# Patient Record
Sex: Female | Born: 1970 | Race: Black or African American | Hispanic: No | Marital: Single | Smoking: Never smoker
Health system: Southern US, Community
[De-identification: ages and names within clinical notes are randomized; demographics above are authoritative.]

## PROBLEM LIST (undated history)

## (undated) DIAGNOSIS — I10 Essential (primary) hypertension: Secondary | ICD-10-CM

## (undated) DIAGNOSIS — Z950 Presence of cardiac pacemaker: Secondary | ICD-10-CM

## (undated) DIAGNOSIS — F411 Generalized anxiety disorder: Secondary | ICD-10-CM

## (undated) DIAGNOSIS — G8929 Other chronic pain: Secondary | ICD-10-CM

## (undated) DIAGNOSIS — K219 Gastro-esophageal reflux disease without esophagitis: Secondary | ICD-10-CM

## (undated) DIAGNOSIS — O24419 Gestational diabetes mellitus in pregnancy, unspecified control: Secondary | ICD-10-CM

## (undated) DIAGNOSIS — M549 Dorsalgia, unspecified: Secondary | ICD-10-CM

## (undated) DIAGNOSIS — R569 Unspecified convulsions: Secondary | ICD-10-CM

## (undated) DIAGNOSIS — G709 Myoneural disorder, unspecified: Secondary | ICD-10-CM

## (undated) HISTORY — DX: Dorsalgia, unspecified: M54.9

## (undated) HISTORY — DX: Gastro-esophageal reflux disease without esophagitis: K21.9

## (undated) HISTORY — DX: Myoneural disorder, unspecified: G70.9

## (undated) HISTORY — DX: Gestational diabetes mellitus in pregnancy, unspecified control: O24.419

## (undated) HISTORY — DX: Other chronic pain: G89.29

## (undated) HISTORY — PX: OVARIAN CYST REMOVAL: SHX89

## (undated) HISTORY — DX: Generalized anxiety disorder: F41.1

## (undated) HISTORY — DX: Essential (primary) hypertension: I10

---

## 2007-07-29 ENCOUNTER — Emergency Department: Payer: Self-pay | Admitting: Emergency Medicine

## 2007-10-14 ENCOUNTER — Other Ambulatory Visit: Payer: Self-pay

## 2007-10-14 ENCOUNTER — Emergency Department: Payer: Self-pay | Admitting: Internal Medicine

## 2007-11-26 ENCOUNTER — Emergency Department: Payer: Self-pay | Admitting: Internal Medicine

## 2007-12-30 ENCOUNTER — Emergency Department: Payer: Self-pay | Admitting: Internal Medicine

## 2008-05-04 ENCOUNTER — Emergency Department: Payer: Self-pay

## 2008-05-25 ENCOUNTER — Emergency Department: Payer: Self-pay | Admitting: Emergency Medicine

## 2008-09-25 ENCOUNTER — Emergency Department: Payer: Self-pay | Admitting: Emergency Medicine

## 2009-01-30 DIAGNOSIS — G709 Myoneural disorder, unspecified: Secondary | ICD-10-CM

## 2009-01-30 HISTORY — DX: Myoneural disorder, unspecified: G70.9

## 2009-08-31 ENCOUNTER — Emergency Department: Payer: Self-pay | Admitting: Emergency Medicine

## 2010-01-20 ENCOUNTER — Emergency Department: Payer: Self-pay | Admitting: Emergency Medicine

## 2010-01-26 ENCOUNTER — Emergency Department: Payer: Self-pay | Admitting: Emergency Medicine

## 2010-02-05 ENCOUNTER — Emergency Department: Payer: Self-pay | Admitting: Emergency Medicine

## 2010-04-13 ENCOUNTER — Emergency Department: Payer: Self-pay | Admitting: Emergency Medicine

## 2010-07-15 ENCOUNTER — Emergency Department: Payer: Self-pay | Admitting: Emergency Medicine

## 2010-09-29 ENCOUNTER — Ambulatory Visit: Payer: Self-pay

## 2010-10-12 ENCOUNTER — Ambulatory Visit: Payer: Self-pay

## 2010-11-15 ENCOUNTER — Observation Stay: Payer: Self-pay | Admitting: Obstetrics and Gynecology

## 2011-05-15 ENCOUNTER — Ambulatory Visit: Payer: Medicaid Other | Admitting: Physical Medicine & Rehabilitation

## 2011-05-16 ENCOUNTER — Encounter
Payer: Medicaid Other | Attending: Physical Medicine and Rehabilitation | Admitting: Physical Medicine and Rehabilitation

## 2011-05-16 ENCOUNTER — Encounter: Payer: Self-pay | Admitting: Physical Medicine and Rehabilitation

## 2011-05-16 VITALS — BP 156/82 | HR 81 | Resp 18 | Ht 62.0 in | Wt 267.0 lb

## 2011-05-16 DIAGNOSIS — M25551 Pain in right hip: Secondary | ICD-10-CM | POA: Insufficient documentation

## 2011-05-16 DIAGNOSIS — M542 Cervicalgia: Secondary | ICD-10-CM | POA: Insufficient documentation

## 2011-05-16 DIAGNOSIS — M25559 Pain in unspecified hip: Secondary | ICD-10-CM | POA: Insufficient documentation

## 2011-05-16 DIAGNOSIS — M79609 Pain in unspecified limb: Secondary | ICD-10-CM | POA: Insufficient documentation

## 2011-05-16 DIAGNOSIS — IMO0002 Reserved for concepts with insufficient information to code with codable children: Secondary | ICD-10-CM

## 2011-05-16 DIAGNOSIS — G35 Multiple sclerosis: Secondary | ICD-10-CM | POA: Insufficient documentation

## 2011-05-16 DIAGNOSIS — R2689 Other abnormalities of gait and mobility: Secondary | ICD-10-CM

## 2011-05-16 DIAGNOSIS — R269 Unspecified abnormalities of gait and mobility: Secondary | ICD-10-CM

## 2011-05-16 DIAGNOSIS — M76899 Other specified enthesopathies of unspecified lower limb, excluding foot: Secondary | ICD-10-CM | POA: Insufficient documentation

## 2011-05-16 DIAGNOSIS — M792 Neuralgia and neuritis, unspecified: Secondary | ICD-10-CM

## 2011-05-16 DIAGNOSIS — M25519 Pain in unspecified shoulder: Secondary | ICD-10-CM | POA: Insufficient documentation

## 2011-05-16 DIAGNOSIS — M25511 Pain in right shoulder: Secondary | ICD-10-CM | POA: Insufficient documentation

## 2011-05-16 NOTE — Progress Notes (Signed)
Subjective:    Patient ID: Mariah Morris, female    DOB: October 04, 1970, 41 y.o.   MRN: [id number]  HPI The patient is a 41 year old African American woman who is currently referred by Dr. Clelia Croft neurologist from [place] [facility]. She tells me she was diagnosed with multiple sclerosis in 2011 at [facility].  She reports it began with numbness tingling in spasms in her extremities. This began in her right lower extremity. She reports some symptoms in the left lower extremity but most of her problems are in the right lower and right upper extremity.  Her chief complaint is right shoulder pain. She also reports right lower extremity pain. The pain in the right shoulder is described as throbbing  and burning. Pain is worse at night when she goes to bed.  Reports jumping and throbbing in the right lower extremity. Reports that this is constant. Report some drawing sensation in the right foot.  Also complains of some low back pain. This occurs with prolonged sitting or standing or walking.  Also reports some abdominal pain located in the lower abdomen I have requested that she followup with her primary care physician for this problem, she expresses understanding.   All of her symptoms began back in 2011 with a diagnosis of her multiple sclerosis.  She has been trialed on hydrocortisone, oxycodone, ibuprofen, Lyrica, gabapentin.   She reports no recent cervical x-rays, she denies any recent falls.  She's also tried Tylenol, icy hot and, hot baths topical ointments and epsom salts  She is currently the mother of twin and 51 month old babies. She reports having her tubes tied in 11/28/2010. She reports he is unable to get pregnant again.  In the home currently resides her boy friends 95 year old son and her twins.  She reports sitting about 4 hours a day but she does engage in light home making tasks and takes care of her babies  . Pain Inventory Average Pain 9 Pain Right Now 9 My pain is  constant, sharp, burning, dull, stabbing, tingling and aching  In the last 24 hours, has pain interfered with the following? General activity 10 Relation with others 10 Enjoyment of life 10 What TIME of day is your pain at its worst? evening and night Sleep (in general) Fair  Pain is worse with: walking, sitting, inactivity, standing and some activites Pain improves with: rest, heat/ice, medication and injections Relief from Meds: 8  Mobility use a cane how many minutes can you walk? 5 ability to climb steps?  no do you drive?  yes  Function not employed: date last employed 02/05/2009 disabled: date disabled in process  Neuro/Psych weakness numbness tingling trouble walking spasms dizziness confusion depression anxiety  Prior Studies Any changes since last visit?  no  Physicians involved in your care Primary care Dr Alexander Mt Neurologist Dr Sherryll Burger Psychiatrist Dr Carman Ching     Review of Systems  Constitutional: Positive for chills, diaphoresis and unexpected weight change.       Night sweats, weight gain, fever and chills (takes Rebif for MS)  Gastrointestinal: Positive for abdominal pain.  Musculoskeletal:       Multiple sclerosis  Neurological: Positive for dizziness, weakness and numbness.  Psychiatric/Behavioral: Positive for confusion and dysphoric mood. The patient is nervous/anxious.   All other systems reviewed and are negative.       Objective:   Physical Exam  Patient is an obese African American woman who appears somewhat tired but not in distress. She is oriented  x3 her speech is clear her affect is alerts and cooperative she follows commands of the difficulty answers questions appropriately  Cranial nerves are grossly intact. She has decreased coordination in the right upper and right lower extremity. She has some difficulty with finger-nose-finger as well as fine movements of the right hand.  Reflexes are 2+ in the upper extremities  and lower extremities.  Hoffmann sign is negative bilaterally  There is no clonus noted  Increased tone is noted in the upper extremity around the shoulder as well as at biceps and triceps  There may be some increased tone in the right lower extremity as well  Significant pain behaviors noted during exam  Manual muscle testing generally 4+ to 5 minus over 5. This is in both upper and lower extremities pain behaviors there noted during manual muscle testing  Patient reports decreased sensation to light touch in right upper and right lower extremity, more so around the right shoulder than distally and is more so in the right lateral foot then medially.  Patient reports decreased vibratory sense and right upper and right lower extremity compared to left upper and left lower extremity  Transitions slowly from sitting to standing  Some difficulty with tandem gait but able to perform Robert's test adequately  Limited range of motion in cervical spine with complaints of pain. Right shoulder is also notable for decreased range of motion in abduction to approximately 90, internal and external rotation are quite limited as well on the right  Lumbar motion is limited to about 20 of forward flexion.  Please note during this assessment I'm having trouble with the current laptop been on using there seems to be some malfunctioning ....     Assessment & Plan:  1.Recent diagnosis of Multiple sclerosis with pain complaints which include cervicalgia, right shoulder pain, and right hip/lower extremity pain.  2. Right shoulder pain  With decreased range of motion and some spasticity noted in Right upper Extremity  3. Cervicalgia  4. Right trochanteric bursitis  4. Gait disorder  5. 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  Consider right greater trochanter hip  injection.  Consider Physical therapy to work on improving right shoulder ROM and Gait/balance.  F/U 4 weeks  Checkk urine UDS

## 2011-05-16 NOTE — Patient Instructions (Signed)
Xrays cervical spine to evaluate for spondylosis,Xrays right shoulder to eval for hooked acromion, degenerative changes.  Consider right greater trochanter hip injection.  Consider Physical therapy to work on improving right shoulder ROM and Gait/balance.  F/U 4 weeks

## 2011-05-19 ENCOUNTER — Ambulatory Visit: Payer: Self-pay

## 2011-05-19 ENCOUNTER — Other Ambulatory Visit: Payer: Self-pay | Admitting: *Deleted

## 2011-05-19 DIAGNOSIS — M25519 Pain in unspecified shoulder: Secondary | ICD-10-CM

## 2011-06-14 ENCOUNTER — Ambulatory Visit: Payer: Medicaid Other | Admitting: Physical Medicine and Rehabilitation

## 2011-06-14 ENCOUNTER — Encounter: Payer: Medicaid Other | Admitting: Physical Medicine and Rehabilitation

## 2011-06-21 ENCOUNTER — Encounter: Payer: Self-pay | Admitting: Physical Medicine and Rehabilitation

## 2011-06-21 ENCOUNTER — Ambulatory Visit: Payer: Medicaid Other | Admitting: Physical Medicine and Rehabilitation

## 2011-06-21 ENCOUNTER — Encounter
Payer: Medicaid Other | Attending: Physical Medicine and Rehabilitation | Admitting: Physical Medicine and Rehabilitation

## 2011-06-21 VITALS — BP 123/71 | HR 75 | Resp 16 | Ht 62.0 in | Wt 280.8 lb

## 2011-06-21 DIAGNOSIS — M545 Low back pain, unspecified: Secondary | ICD-10-CM | POA: Insufficient documentation

## 2011-06-21 DIAGNOSIS — IMO0002 Reserved for concepts with insufficient information to code with codable children: Secondary | ICD-10-CM | POA: Insufficient documentation

## 2011-06-21 DIAGNOSIS — M79604 Pain in right leg: Secondary | ICD-10-CM

## 2011-06-21 DIAGNOSIS — M792 Neuralgia and neuritis, unspecified: Secondary | ICD-10-CM

## 2011-06-21 MED ORDER — OXYCODONE-ACETAMINOPHEN 5-325 MG PO TABS: 1.0000 | ORAL_TABLET | ORAL | Status: DC | PRN

## 2011-06-21 MED ORDER — OXYCODONE-ACETAMINOPHEN 5-325 MG PO TABS: 1.0000 | ORAL_TABLET | Freq: Once | ORAL | Status: AC

## 2011-06-21 NOTE — Patient Instructions (Addendum)
I have ordered low back x-rays for you  Once I reviewed these I. will most likely order an MRI.  I would like to get you is a physical therapy however I'm not sure you can participate with your current pain level. We also have discussed consideration of epidural steroid's to help decrease your leg pain.  I have ordered some pain medication for bed time  Please keep your pain medication locked up and in a secure location.  Taken as directed do not take more than what is indicated on the bottle  I will see you back in one month

## 2011-06-21 NOTE — Progress Notes (Addendum)
Subjective:    Patient ID: Mariah Morris, female    DOB: 1970/10/24, 41 y.o.   MRN: [id number]  HPI  The patient is a 41 year old woman. Her chief complaint today is right leg pain. She also has a history of chronic low back pain which began after the birth of her twins in October of 2012.  Pain is described in the right leg especially below the knee as sharp dull and throbbing. Pain is constant but but is worse with inactivity. A little bit of numbness is noted below the knee in the right lower extremity. There are no problems controlling bowel or bladder. No history of any falls. She does report some difficulty sleeping.  Average pain and low back at this time is about an 8  Average pain in right leg is about an 8 scale of 10  She is taking Lyrica 75 mg once in the morning at this time  She has tried steroids (hydrocortisone) oxycodone, ibuprofen, Lyrica, gabapentin.  At the last visit her right shoulder was her biggest problem however over the last several weeks her right leg has become her main issue.    Past Medical History  Diagnosis Date  . Neuromuscular disorder 2011    MS  . Hypertension   . GERD (gastroesophageal reflux disease)   . Gestational diabetes    Past Surgical History  Procedure Date  . Ovarian cyst removal   . Cesarean section     x2      Prior to Admission medications   Medication Sig Start Date End Date Taking? Authorizing Provider  ALPRAZolam Prudy Feeler) 1 MG tablet Take 1 mg by mouth at bedtime as needed. Dr Laroy Apple rx   Yes Historical Provider, MD  DULoxetine (CYMBALTA) 60 MG capsule Take 60 mg by mouth daily.   Yes Wonda Cheng, MD  ferrous sulfate 325 (65 FE) MG tablet Take 325 mg by mouth daily with breakfast.   Yes Historical Provider, MD  hydrochlorothiazide (HYDRODIURIL) 25 MG tablet Take 25 mg by mouth daily.   Yes Historical Provider, MD  interferon beta-1a (REBIF) 22 MCG/0.5ML injection Inject 22 mcg into the skin 3 (three) times a  week.   Yes Historical Provider, MD  pregabalin (LYRICA) 75 MG capsule Take 75 mg by mouth 2 (two) times daily. Takes once because of weight gain   Yes Historical Provider, MD  esomeprazole (NEXIUM) 40 MG capsule Take 40 mg by mouth daily before breakfast.    Historical Provider, MD  oxyCODONE-acetaminophen (PERCOCET) 5-325 MG per tablet Take 1 tablet by mouth once. 1 tablet to one half tablet at night before bed on a when necessary basis for right leg pain 06/21/11 07/01/11  Ashok Cordia, MD      Pain Inventory Average Pain 8 Pain Right Now 8 My pain is dull, stabbing and aching  In the last 24 hours, has pain interfered with the following? General activity 5 Relation with others 5 Enjoyment of life 5 What TIME of day is your pain at its worst? morning and night Sleep (in general) Fair  Pain is worse with: walking, bending, sitting and standing Pain improves with: rest, heat/ice and medication Relief from Meds: 5  Mobility walk without assistance walk with assistance use a cane how many minutes can you walk? 5-10 ability to climb steps?  yes do you drive?  yes  Function not employed: date last employed - I need assistance with the following:  dressing, bathing, household duties and shopping  Neuro/Psych weakness  tingling trouble walking spasms dizziness depression anxiety  Prior Studies Any changes since last visit?  no  Physicians involved in your care Any changes since last visit?  no     Review of Systems  Constitutional: Positive for diaphoresis and unexpected weight change.       Night sweats, weight gain, high blood sugars  Gastrointestinal: Positive for abdominal pain.  Musculoskeletal: Positive for back pain and gait problem.  Neurological: Positive for dizziness and weakness.  Psychiatric/Behavioral: Positive for dysphoric mood. The patient is nervous/anxious.   All other systems reviewed and are negative.       Objective:   Physical  Exam  BP 123/71  Pulse 75  Resp 16  Ht 5\' 2"  (1.575 m)  Wt 280 lb 12.8 oz (127.37 kg)  BMI 51.36 kg/m2  SpO2 98%  Patient is an obese [place] woman who appears tired. She is oriented x3 her speech is clear her affect is bright she's alert cooperative and pleasant.   Cranial nerves are grossly intact coordination is grossly intact  Reflexes are 2+ at patellar and Achilles tendons bilaterally  She reports decreased sensation to light touch and pinprick in the right leg below the knee  Straight leg raise is negative  She has difficulty giving full effort for right hip flexion. Knee flexion and extension, ankle dorsiflexion and plantarflexion as well as the inverters and extensor hallux longus strength is 5 over 5. Left lower extremity strength 5/5 without obvious focal deficit  Mild tenderness is noted over right trochanter.  Difficulty rolling from right to left on exam table complains of some back pain  Transitioning from sit to stand also aggravates low back  Gait is slow, pain with lumbar flexion but worse with extension  Increased back pain with heel toe walking patient declines  Balance fair   oswestry 62% Fall risk moderate     Assessment & Plan:  1. Right leg pain  2. Low back pain  3. Right shoulder pain (5/10)  4. mild trochanteric bursitis on right  Recent radiographs of cervical spine and right shoulder were reviewed with the patient today.  Focus has changed from shoulder and neck to low back and right lower extremity today.  We'll obtain lumbar radiographs moving toward MRI as well as lumbar epidural steroid injection.  Patient has trialed oral steroids, neuropathic pain medications, nonsteroidal anti-inflammatory medications.  Her right leg pain has wax and wane over the last 6-7 months.   Recommend that she take her Lyrica twice a day as it was originally ordered to help with a neuropathic pain.  Her biggest problem is sleeping at  night. Will prescribe percocet 5/325 one per os 2 one half per os at at bedtime.#10  Risks and benefits as well as precautions are discussed regarding Percocet.

## 2011-07-10 ENCOUNTER — Ambulatory Visit: Payer: Self-pay

## 2011-07-19 ENCOUNTER — Encounter
Payer: Medicaid Other | Attending: Physical Medicine and Rehabilitation | Admitting: Physical Medicine and Rehabilitation

## 2011-07-19 ENCOUNTER — Encounter: Payer: Self-pay | Admitting: Physical Medicine and Rehabilitation

## 2011-07-19 VITALS — BP 128/77 | HR 74 | Ht 62.0 in | Wt 280.8 lb

## 2011-07-19 DIAGNOSIS — M62838 Other muscle spasm: Secondary | ICD-10-CM

## 2011-07-19 DIAGNOSIS — M79604 Pain in right leg: Secondary | ICD-10-CM

## 2011-07-19 DIAGNOSIS — M545 Low back pain, unspecified: Secondary | ICD-10-CM | POA: Insufficient documentation

## 2011-07-19 DIAGNOSIS — IMO0002 Reserved for concepts with insufficient information to code with codable children: Secondary | ICD-10-CM | POA: Insufficient documentation

## 2011-07-19 DIAGNOSIS — M792 Neuralgia and neuritis, unspecified: Secondary | ICD-10-CM

## 2011-07-19 MED ORDER — BACLOFEN 10 MG PO TABS: 10.0000 mg | ORAL_TABLET | Freq: Every evening | ORAL | Status: AC

## 2011-07-19 MED ORDER — OXYCODONE-ACETAMINOPHEN 5-325 MG PO TABS: 1.0000 | ORAL_TABLET | Freq: Every evening | ORAL | Status: AC | PRN

## 2011-07-19 NOTE — Progress Notes (Signed)
Subjective:    Patient ID: Mariah Morris, female    DOB: [birth year], 41 y.o.   MRN: [id number]  HPIThe patient is a 41 year old woman. Her chief complaint today is right leg pain. She also has a history of chronic low back pain which began after the birth of her twins in October of 2012.  Pain is described in the right leg especially below the knee as sharp dull and throbbing. Pain is constant but but is worse with inactivity. A little bit of numbness is noted below the knee in the right lower extremity. There are no problems controlling bowel or bladder. No history of any falls. She does report some difficulty sleeping.   Average pain and low back at this time is about an 8   Average pain in right leg is about an 8 scale of 10   She is taking Lyrica 75 mg once in the morning at this time   She has tried steroids (hydrocortisone) oxycodone, ibuprofen, Lyrica, gabapentin.   At the last visit her right shoulder was her biggest problem however over the last several weeks her right leg has become her main issue.    3 days a week 30 min per day   7-8/10  Back is an 8 on a scale of 10  Right leg is a 7 on a scale of 10, he is aggravated by prolonged sitting prolonged standing or prolonged Lying.  Patient is about the same in her view from last month   Pain Inventory Average Pain 8 Pain Right Now 7 My pain is intermittent, sharp, burning, dull, stabbing, tingling and aching  In the last 24 hours, has pain interfered with the following? General activity 7 Relation with others 3 Enjoyment of life 5 What TIME of day is your pain at its worst? night Sleep (in general) Poor  Pain is worse with: walking, bending, sitting, standing and some activites Pain improves with: rest, heat/ice, therapy/exercise and medication Relief from Meds: 8  Mobility walk without assistance use a cane how many minutes can you walk? 30 ability to climb steps?  no do you drive?  yes  Function not  employed: date last employed 2011 I need assistance with the following:  meal prep, household duties and shopping  Neuro/Psych weakness numbness tingling trouble walking spasms dizziness confusion depression anxiety  Prior Studies CT/MRI  Physicians involved in your care Any changes since last visit?  no   Family History  Problem Relation Age of Onset  . Hypertension Mother   . Diabetes Father   . Hypertension Father    History   Social History  . Marital Status: Unknown    Spouse Name: N/A    Number of Children: N/A  . Years of Education: N/A   Social History Main Topics  . Smoking status: Never Smoker   . Smokeless tobacco: Never Used  . Alcohol Use: None  . Drug Use: None  . Sexually Active: None   Other Topics Concern  . None   Social History Narrative  . None   Past Surgical History  Procedure Date  . Ovarian cyst removal   . Cesarean section     x2   Past Medical History  Diagnosis Date  . Neuromuscular disorder 2011    MS  . Hypertension   . GERD (gastroesophageal reflux disease)   . Gestational diabetes    BP 128/77  Pulse 74  Ht 5\' 2"  (1.575 m)  Wt 280 lb 12.8 oz (127.37 kg)  BMI 51.36 kg/m2  SpO2 97%   Review of Systems  Constitutional: Positive for unexpected weight change.  Gastrointestinal: Positive for abdominal pain and diarrhea.  Musculoskeletal: Positive for back pain and gait problem.  Neurological: Positive for dizziness, weakness and numbness.  Psychiatric/Behavioral: Positive for confusion and dysphoric mood. The patient is nervous/anxious.   All other systems reviewed and are negative.       Objective:   Physical Exam   Patient is an obese African American woman who appears tired. She is oriented x3 her speech is clear her affect is bright she's alert cooperative and pleasant.    Cranial nerves are grossly intact coordination is grossly intact  Reflexes are 2+ at patellar and Achilles tendons bilaterally     She reports decreased sensation to light touch and pinprick in the right leg below the knee  Straight leg raise is negative    She has difficulty giving full effort for right hip flexion. Knee flexion and extension, ankle dorsiflexion and plantarflexion as well as the inverters and extensor hallux longus strength is 5 over 5. Left lower extremity strength 5/5 without obvious focal deficit  Mild tenderness is noted over right trochanter.   Difficulty rolling from right to left on exam table complains of some back pain  Transitioning from sit to stand also aggravates low back    Gait is slow, pain with lumbar flexion but worse with extension  Increased back pain with heel toe walking patient declines   Balance fair       Assessment & Plan:  1. Right leg pain  2. Low back pain  3. Right shoulder pain (5/10)  4. mild trochanteric bursitis on right   She seems to be moving easier today her strength is good in both lower extremities she is less tender over the trochanter's. From last month overall Mild improvement.  I would like to have her continue walking on a daily basis.    Recent radiographs of cervical spine and right shoulder were reviewed at last visit Focus has changed from shoulder and neck at initial visit to low back and right lower extremity.     Patient has trialed oral steroids, neuropathic pain medications, nonsteroidal anti-inflammatory medications.    Her right leg pain has wax and wane over the last 6-7 months.   Recommend that she take her Lyrica twice a day as it was originally ordered to help with a neuropathic pain.   Increasing Lyrica to twice a day did help somewhat she did note some improvement in sleeping as well area  Her biggest concern about this medication is causing weight gain for her  I have asked her to make sure she walks on a daily basis 30 minutes.  Her flareup of back pain and right leg pain has lasted about 8 weeks now. At this  point she is not really interested in any kind of invasive means to manage her low back or right leg pain. I have discussed consideration of epidural steroid injection but she is reluctant at this time and would like to pursue physical therapy.  We discussed consideration of some physical therapy. I will order that for her today.  Her biggest problem is sleeping at night.   Risks and benefits as well as precautions are discussed regarding Percocet.   Percocet one half tablet per os each bedtime #15

## 2011-07-19 NOTE — Patient Instructions (Signed)
I have added baclofen at night to help you with leg spasms I have ordered physical therapy  I have reordered your pain medication  I will see you back in 2 months  Nurse practitioner next month for refill of medications

## 2011-07-25 ENCOUNTER — Telehealth: Payer: Self-pay | Admitting: *Deleted

## 2011-07-25 DIAGNOSIS — M25511 Pain in right shoulder: Secondary | ICD-10-CM

## 2011-07-25 DIAGNOSIS — M542 Cervicalgia: Secondary | ICD-10-CM

## 2011-07-25 DIAGNOSIS — M25551 Pain in right hip: Secondary | ICD-10-CM

## 2011-07-25 NOTE — Telephone Encounter (Signed)
Would like a referral for PT in [place].

## 2011-07-26 NOTE — Telephone Encounter (Signed)
thanks

## 2011-07-26 NOTE — Telephone Encounter (Signed)
Would this referral be okay?

## 2011-07-26 NOTE — Telephone Encounter (Signed)
Pt aware that referral will be made and she will be contacted once it is.

## 2011-07-26 NOTE — Telephone Encounter (Signed)
Sure this referral would be fine

## 2011-08-02 ENCOUNTER — Ambulatory Visit: Payer: Medicaid Other | Admitting: Physical Therapy

## 2011-08-07 ENCOUNTER — Telehealth: Payer: Self-pay | Admitting: Physical Medicine and Rehabilitation

## 2011-08-07 NOTE — Telephone Encounter (Signed)
No answer and no voicemail, so no message left.

## 2011-08-07 NOTE — Telephone Encounter (Signed)
Patient needs refill on meds

## 2011-08-08 ENCOUNTER — Ambulatory Visit: Payer: Medicaid Other | Admitting: Rehabilitative and Restorative Service Providers"

## 2011-08-08 NOTE — Telephone Encounter (Signed)
[facility] number out of service.

## 2011-08-16 ENCOUNTER — Other Ambulatory Visit: Payer: Self-pay | Admitting: *Deleted

## 2011-08-16 ENCOUNTER — Other Ambulatory Visit: Payer: Self-pay | Admitting: Physical Medicine and Rehabilitation

## 2011-08-16 ENCOUNTER — Encounter: Payer: Self-pay | Admitting: Physical Medicine and Rehabilitation

## 2011-08-16 ENCOUNTER — Encounter
Payer: Medicaid Other | Attending: Physical Medicine and Rehabilitation | Admitting: Physical Medicine and Rehabilitation

## 2011-08-16 ENCOUNTER — Telehealth: Payer: Self-pay | Admitting: Physical Medicine and Rehabilitation

## 2011-08-16 VITALS — BP 135/64 | HR 79 | Resp 14 | Ht 62.0 in | Wt 277.0 lb

## 2011-08-16 DIAGNOSIS — G35 Multiple sclerosis: Secondary | ICD-10-CM | POA: Insufficient documentation

## 2011-08-16 DIAGNOSIS — M79609 Pain in unspecified limb: Secondary | ICD-10-CM

## 2011-08-16 DIAGNOSIS — R209 Unspecified disturbances of skin sensation: Secondary | ICD-10-CM | POA: Insufficient documentation

## 2011-08-16 DIAGNOSIS — M545 Low back pain, unspecified: Secondary | ICD-10-CM | POA: Insufficient documentation

## 2011-08-16 DIAGNOSIS — G8929 Other chronic pain: Secondary | ICD-10-CM | POA: Insufficient documentation

## 2011-08-16 DIAGNOSIS — M25519 Pain in unspecified shoulder: Secondary | ICD-10-CM | POA: Insufficient documentation

## 2011-08-16 DIAGNOSIS — M79604 Pain in right leg: Secondary | ICD-10-CM

## 2011-08-16 DIAGNOSIS — M542 Cervicalgia: Secondary | ICD-10-CM | POA: Insufficient documentation

## 2011-08-16 MED ORDER — HYDROCODONE-ACETAMINOPHEN 5-325 MG PO TABS: 1.0000 | ORAL_TABLET | ORAL | Status: DC

## 2011-08-16 MED ORDER — HYDROCODONE-ACETAMINOPHEN 5-325 MG PO TABS: 1.0000 | ORAL_TABLET | Freq: Every day | ORAL | Status: DC | PRN

## 2011-08-16 NOTE — Telephone Encounter (Signed)
The medication given today is not what Dr Pamelia Hoit usually gives her.  She wants that because she thinks it works better.

## 2011-08-16 NOTE — Telephone Encounter (Signed)
New order placed correcting sig to 1 daily prn instead of 1 day/1 dose

## 2011-08-16 NOTE — Telephone Encounter (Signed)
Her med was not in the med list, we, or me and Sibyl looked it up in her history, oxycodone was not documented, did you find it somewhere

## 2011-08-16 NOTE — Progress Notes (Signed)
Subjective:    Patient ID: Mariah Morris, female    DOB: 1970/10/19, 41 y.o.   MRN: [id number]  HPI The patient complains about chronic low back pain which radiates into right leg in a diffuse distribution , she also complains about neck and right shoulder pain.  The patient also complains about numbness and tingling in all 4 extremities in a diffuse pattern, worse on the right. The problem has been stable. Pain Inventory Average Pain 8 Pain Right Now 8 My pain is constant, burning, dull, stabbing and aching  In the last 24 hours, has pain interfered with the following? General activity 7 Relation with others 7 Enjoyment of life 8 What TIME of day is your pain at its worst? morning and night Sleep (in general) Fair  Pain is worse with: walking, bending, sitting, standing and some activites Pain improves with: rest, heat/ice and medication Relief from Meds: 8  Mobility use a cane ability to climb steps?  no do you drive?  yes  Function not employed: date last employed  I need assistance with the following:  dressing, bathing, household duties and shopping  Neuro/Psych weakness numbness trouble walking spasms dizziness depression anxiety  Prior Studies Any changes since last visit?  no  Physicians involved in your care Any changes since last visit?  no   Family History  Problem Relation Age of Onset  . Hypertension Mother   . Diabetes Father   . Hypertension Father    History   Social History  . Marital Status: Unknown    Spouse Name: N/A    Number of Children: N/A  . Years of Education: N/A   Social History Main Topics  . Smoking status: Never Smoker   . Smokeless tobacco: Never Used  . Alcohol Use: None  . Drug Use: None  . Sexually Active: None   Other Topics Concern  . None   Social History Narrative  . None   Past Surgical History  Procedure Date  . Ovarian cyst removal   . Cesarean section     x2   Past Medical History    Diagnosis Date  . Neuromuscular disorder 2011    MS  . Hypertension   . GERD (gastroesophageal reflux disease)   . Gestational diabetes    BP 135/64  Pulse 79  Resp 14  Ht 5\' 2"  (1.575 m)  Wt 277 lb (125.646 kg)  BMI 50.66 kg/m2  SpO2 98%  LMP 08/13/2011     Review of Systems  HENT: Positive for neck pain.   Gastrointestinal: Positive for abdominal pain.  Musculoskeletal: Positive for myalgias, back pain, arthralgias and gait problem.  Neurological: Positive for dizziness, weakness and numbness.  Psychiatric/Behavioral: Positive for dysphoric mood. The patient is nervous/anxious.   All other systems reviewed and are negative.       Objective:   Physical Exam  Constitutional: She is oriented to person, place, and time. She appears well-developed and well-nourished.       Morbidly obese  HENT:  Head: Normocephalic.  Neck: Neck supple.  Musculoskeletal: She exhibits tenderness.  Neurological: She is alert and oriented to person, place, and time.  Skin: Skin is warm and dry.  Psychiatric: She has a normal mood and affect.    Symmetric normal motor tone is noted throughout. Normal muscle bulk. Muscle testing reveals 5/5 muscle strength of the upper extremity, and 5/5 of the lower extremity. Full range of motion in upper and lower extremities. ROM of spine is restricted. Fine  motor movements are normal in both hands. Sensory is decreased to light touch, on the right.  DTR in the upper and lower extremity are present and symmetric 2+ except right patella 3+. No clonus is noted.  Patient arises from chair with difficulty. Wide based gait with decreased arm swing bilateral , able to stand on heels and toes . Tandem walk is possible but unstable. No pronator drift. Rhomberg negative. No tremor, dystaxia or dysmetria noted.        Assessment & Plan:  1.MS  2. Low back pain  3. Right sided pain, neck, shoulder back and leg in a diffuse, non radicular pattern 4. Decreased  sensitivity on right side MRI of the right shoulder was unremarkable MRI of the cervical spine was without significant findings, MRI of the lumbar spine showed a mild grades 1 Spondylolisthesis of L4 on L5.  The diffuse symptoms of the patient, are most likely coming from her MS.  I advised the patient to continue with her walking program, and continue exercising. She should start physical therapy next week. And she will follow up with her neurologist. I refilled her hydrocodone 5 mg one tablet at nighttime, and consider an anti-inflammatory medication if the neurologist approves. Followup in one month

## 2011-08-16 NOTE — Telephone Encounter (Signed)
She was given #15 Percocet 5/325 on 07/19/11 by Dr. Pamelia Hoit.

## 2011-08-16 NOTE — Telephone Encounter (Signed)
You saw this pt today and you gave her Hydrocodone. Dr. Pamelia Hoit usually gives her Oxycodone. Do you want to change her back?

## 2011-08-16 NOTE — Patient Instructions (Signed)
Continue with your walking program, start PT. Ask Neurologist whether you can take an anti inflammatory, like Mobic.

## 2011-08-17 ENCOUNTER — Other Ambulatory Visit: Payer: Self-pay | Admitting: *Deleted

## 2011-08-17 MED ORDER — OXYCODONE-ACETAMINOPHEN 5-325 MG PO TABS: 0.5000 | ORAL_TABLET | Freq: Every evening | ORAL | Status: DC | PRN

## 2011-08-17 NOTE — Telephone Encounter (Signed)
Hydrocodone does not work for [employer], she wanted to get the Percocet as prescribed by Dr Pamelia Hoit. I verified that [facility] had the hydrocodone RX and had not filled it. I instructed them to destroy that RX and we would RX the Percocet. Notified Daliyah the Percocet may be picked up by her son Myrtis Hopping.

## 2011-08-17 NOTE — Telephone Encounter (Signed)
Pt has already received Oxycodone back.

## 2011-08-17 NOTE — Telephone Encounter (Signed)
Did she ever try hydrocodone ? I prescribed her 30 a month, if she wants to go back , or does not tolerate hydrocodone, we can go back to oxy, but then 15 pills

## 2011-09-04 ENCOUNTER — Encounter
Payer: Medicaid Other | Attending: Physical Medicine and Rehabilitation | Admitting: Physical Medicine and Rehabilitation

## 2011-09-04 ENCOUNTER — Ambulatory Visit: Payer: Medicaid Other | Admitting: Physical Medicine and Rehabilitation

## 2011-09-15 ENCOUNTER — Encounter
Payer: Medicaid Other | Attending: Physical Medicine and Rehabilitation | Admitting: Physical Medicine and Rehabilitation

## 2011-09-15 ENCOUNTER — Encounter: Payer: Self-pay | Admitting: Physical Medicine and Rehabilitation

## 2011-09-15 VITALS — BP 144/75 | HR 84 | Resp 14 | Ht 63.0 in | Wt 282.0 lb

## 2011-09-15 DIAGNOSIS — M542 Cervicalgia: Secondary | ICD-10-CM | POA: Insufficient documentation

## 2011-09-15 DIAGNOSIS — G35 Multiple sclerosis: Secondary | ICD-10-CM | POA: Insufficient documentation

## 2011-09-15 DIAGNOSIS — M545 Low back pain, unspecified: Secondary | ICD-10-CM | POA: Insufficient documentation

## 2011-09-15 DIAGNOSIS — M79609 Pain in unspecified limb: Secondary | ICD-10-CM | POA: Insufficient documentation

## 2011-09-15 DIAGNOSIS — M25519 Pain in unspecified shoulder: Secondary | ICD-10-CM | POA: Insufficient documentation

## 2011-09-15 DIAGNOSIS — G8929 Other chronic pain: Secondary | ICD-10-CM | POA: Insufficient documentation

## 2011-09-15 MED ORDER — OXYCODONE-ACETAMINOPHEN 5-325 MG PO TABS: 0.5000 | ORAL_TABLET | Freq: Every evening | ORAL | Status: DC | PRN

## 2011-09-15 MED ORDER — MELOXICAM 15 MG PO TABS: 15.0000 mg | ORAL_TABLET | Freq: Every day | ORAL | Status: DC

## 2011-09-15 NOTE — Patient Instructions (Signed)
Start PT, continue seeing your counselor.

## 2011-09-15 NOTE — Progress Notes (Signed)
Subjective:    Patient ID: Mariah Morris, female    DOB: 04-Oct-1970, 41 y.o.   MRN: [id number]  HPI The patient complains about chronic low back pain which radiates into right leg in a diffuse distribution , she also complains about neck and right shoulder pain. The patient also complains about numbness and tingling in all 4 extremities in a diffuse pattern, worse on the right. The patient states, that she has seen her neurologist since her last visit, she states, that he is ok with her taking an anti inflammatory. The patient reports, that she did not go to PT yet. The problem has been stable.  Pain Inventory Average Pain 9 Pain Right Now 9 My pain is constant, sharp, tingling and aching  In the last 24 hours, has pain interfered with the following? General activity 1 Relation with others 0 Enjoyment of life 1 What TIME of day is your pain at its worst? evening Sleep (in general) Poor  Pain is worse with: walking, bending, sitting, inactivity and standing Pain improves with: rest, heat/ice and medication Relief from Meds: 8  Mobility use a cane how many minutes can you walk? 5 ability to climb steps?  yes do you drive?  yes Do you have any goals in this area?  no  Function I need assistance with the following:  dressing, bathing, meal prep, household duties and shopping Do you have any goals in this area?  no  Neuro/Psych weakness numbness tingling trouble walking spasms dizziness confusion depression anxiety  Prior Studies Any changes since last visit?  no  Physicians involved in your care Any changes since last visit?  no   Family History  Problem Relation Age of Onset  . Hypertension Mother   . Diabetes Father   . Hypertension Father    History   Social History  . Marital Status: Unknown    Spouse Name: N/A    Number of Children: N/A  . Years of Education: N/A   Social History Main Topics  . Smoking status: Never Smoker   . Smokeless  tobacco: Never Used  . Alcohol Use: None  . Drug Use: None  . Sexually Active: None   Other Topics Concern  . None   Social History Narrative  . None   Past Surgical History  Procedure Date  . Ovarian cyst removal   . Cesarean section     x2   Past Medical History  Diagnosis Date  . Neuromuscular disorder 2011    MS  . Hypertension   . GERD (gastroesophageal reflux disease)   . Gestational diabetes    BP 144/75  Pulse 84  Resp 14  Ht 5\' 3"  (1.6 m)  Wt 282 lb (127.914 kg)  BMI 49.95 kg/m2  SpO2 100%     Review of Systems  Constitutional: Positive for unexpected weight change.  Gastrointestinal: Positive for abdominal pain and constipation.  Musculoskeletal: Positive for myalgias and arthralgias.  Neurological: Positive for dizziness, weakness and numbness.  Psychiatric/Behavioral: Positive for confusion and dysphoric mood. The patient is nervous/anxious.   All other systems reviewed and are negative.       Objective:   Physical Exam Constitutional: She is oriented to person, place, and time. She appears well-developed and well-nourished.  Morbidly obese  HENT:  Head: Normocephalic.  Neck: Neck supple.  Musculoskeletal: She exhibits tenderness.  Neurological: She is alert and oriented to person, place, and time.  Skin: Skin is warm and dry.  Psychiatric: She has a normal  mood and affect.   Symmetric normal motor tone is noted throughout. Normal muscle bulk. Muscle testing reveals 5/5 muscle strength of the upper extremity, and 5/5 of the lower extremity. Full range of motion in upper and lower extremities. ROM of spine is restricted. Fine motor movements are normal in both hands.  Sensory is decreased to light touch, on the right.  DTR in the upper and lower extremity are present and symmetric 2+ except right patella 3+. No clonus is noted.  Patient arises from chair with difficulty. Wide based gait with decreased arm swing bilateral , able to stand on  heels and toes . Tandem walk is possible but unstable. No pronator drift. Rhomberg negative. No tremor, dystaxia or dysmetria noted.         Assessment & Plan:  1.MS  2. Low back pain  3. Right sided pain, neck, shoulder back and leg in a diffuse, non radicular pattern  4. Decreased sensitivity on right side  MRI of the right shoulder was unremarkable MRI of the cervical spine was without significant findings, MRI of the lumbar spine showed a mild grades 1 Spondylolisthesis of L4 on L5.  The diffuse symptoms of the patient, are most likely coming from her MS.  I advised the patient to continue with her walking program, and continue exercising. She states that she did not do the physical therapy, advised patient to start, to help her with her balance.  I refilled her hydrocodone 5 mg 1/2 a tablet at nighttime, and prescribed mobic.   Followup in one month

## 2011-10-18 ENCOUNTER — Telehealth: Payer: Self-pay | Admitting: Physical Medicine and Rehabilitation

## 2011-10-18 MED ORDER — OXYCODONE-ACETAMINOPHEN 5-325 MG PO TABS: 0.5000 | ORAL_TABLET | Freq: Every evening | ORAL | Status: DC | PRN

## 2011-10-18 NOTE — Telephone Encounter (Signed)
Rx is ready to be picked up, pt aware. 

## 2011-10-18 NOTE — Telephone Encounter (Signed)
Printed for Karen to sign 

## 2011-10-18 NOTE — Telephone Encounter (Signed)
refiil Percocet

## 2011-11-09 ENCOUNTER — Encounter: Payer: Self-pay | Admitting: Physical Medicine and Rehabilitation

## 2011-11-09 ENCOUNTER — Encounter
Payer: Medicaid Other | Attending: Physical Medicine and Rehabilitation | Admitting: Physical Medicine and Rehabilitation

## 2011-11-09 VITALS — BP 137/75 | HR 79 | Resp 16 | Ht 62.0 in | Wt 294.0 lb

## 2011-11-09 DIAGNOSIS — M25519 Pain in unspecified shoulder: Secondary | ICD-10-CM | POA: Insufficient documentation

## 2011-11-09 DIAGNOSIS — G35 Multiple sclerosis: Secondary | ICD-10-CM | POA: Insufficient documentation

## 2011-11-09 DIAGNOSIS — M545 Low back pain, unspecified: Secondary | ICD-10-CM | POA: Insufficient documentation

## 2011-11-09 DIAGNOSIS — G8929 Other chronic pain: Secondary | ICD-10-CM | POA: Insufficient documentation

## 2011-11-09 DIAGNOSIS — M542 Cervicalgia: Secondary | ICD-10-CM | POA: Insufficient documentation

## 2011-11-09 DIAGNOSIS — M79609 Pain in unspecified limb: Secondary | ICD-10-CM | POA: Insufficient documentation

## 2011-11-09 DIAGNOSIS — Z5181 Encounter for therapeutic drug level monitoring: Secondary | ICD-10-CM

## 2011-11-09 MED ORDER — OXYCODONE-ACETAMINOPHEN 5-325 MG PO TABS: 0.5000 | ORAL_TABLET | Freq: Every evening | ORAL | Status: DC | PRN

## 2011-11-09 MED ORDER — OXYCODONE-ACETAMINOPHEN 5-325 MG PO TABS: 1.0000 | ORAL_TABLET | ORAL | Status: DC | PRN

## 2011-11-09 NOTE — Progress Notes (Signed)
Subjective:    Patient ID: Mariah Morris, female    DOB: October 18, 1970, 41 y.o.   MRN: [id number]  HPI The patient complains about chronic low back pain which radiates into right leg in a diffuse distribution , she also complains about neck and right shoulder pain. The patient also complains about numbness and tingling in all 4 extremities in a diffuse pattern, worse on the right. The patient states, that she has seen her neurologist since her last visit, she states, that he is ok with her taking an anti inflammatory. The patient reports, that she did not go to PT yet.  The problem has been stable.  Pain Inventory Average Pain 10 Pain Right Now 10 My pain is constant, sharp, burning, tingling and aching  In the last 24 hours, has pain interfered with the following? General activity 5 Relation with others 5 Enjoyment of life 4 What TIME of day is your pain at its worst? all the time Sleep (in general) Poor  Pain is worse with: walking, bending, sitting and standing Pain improves with: rest, heat/ice and medication Relief from Meds: 8  Mobility how many minutes can you walk? 5 ability to climb steps?  yes do you drive?  yes  Function disabled: date disabled 2013  Neuro/Psych weakness numbness trouble walking  Prior Studies Any changes since last visit?  no  Physicians involved in your care Any changes since last visit?  no   Family History  Problem Relation Age of Onset  . Hypertension Mother   . Diabetes Father   . Hypertension Father    History   Social History  . Marital Status: Unknown    Spouse Name: N/A    Number of Children: N/A  . Years of Education: N/A   Social History Main Topics  . Smoking status: Never Smoker   . Smokeless tobacco: Never Used  . Alcohol Use: None  . Drug Use: None  . Sexually Active: None   Other Topics Concern  . None   Social History Narrative  . None   Past Surgical History  Procedure Date  . Ovarian cyst removal    . Cesarean section     x2   Past Medical History  Diagnosis Date  . Neuromuscular disorder 2011    MS  . Hypertension   . GERD (gastroesophageal reflux disease)   . Gestational diabetes    BP 137/75  Pulse 79  Resp 16  Ht 5\' 2"  (1.575 m)  Wt 294 lb (133.358 kg)  BMI 53.77 kg/m2  SpO2 100%     Review of Systems  Musculoskeletal: Positive for myalgias and arthralgias.  Neurological: Positive for weakness and numbness.  All other systems reviewed and are negative.       Objective:   Physical Exam Constitutional: She is oriented to person, place, and time. She appears well-developed and well-nourished.  Morbidly obese  HENT:  Head: Normocephalic.  Neck: Neck supple.  Musculoskeletal: She exhibits tenderness.  Neurological: She is alert and oriented to person, place, and time.  Skin: Skin is warm and dry.  Psychiatric: She has a normal mood and affect.  Symmetric normal motor tone is noted throughout. Normal muscle bulk. Muscle testing reveals 5/5 muscle strength of the upper extremity, and 5/5 of the lower extremity. Full range of motion in upper and lower extremities. ROM of spine is restricted. Fine motor movements are normal in both hands.  Sensory is decreased to light touch, on the right.  DTR in the  upper and lower extremity are present and symmetric 2+ except right patella 3+. No clonus is noted.  Patient arises from chair with difficulty. Wide based gait with decreased arm swing bilateral , able to stand on heels and toes . Tandem walk is possible but unstable. No pronator drift. Rhomberg negative. No tremor, dystaxia or dysmetria noted.         Assessment & Plan:  1.MS  2. Low back pain  3. Right sided pain, neck, shoulder back and leg in a diffuse, non radicular pattern  4. Decreased sensitivity on right side  MRI of the right shoulder was unremarkable MRI of the cervical spine was without significant findings, MRI of the lumbar spine showed a mild grade  1 Spondylolisthesis of L4 on L5.  The diffuse symptoms of the patient, are most likely coming from her MS.  I advised the patient to continue with her walking program, and continue exercising. She states that she did not do the physical therapy, advised patient to start, to help her with her balance.  I refilled her oxycodone 5 mg, increased it from 1/2 a tablet at nighttime, to one tablet at hs, prn, and ordered PT again, educated patient about the importance of PT, she understood and will start it now.   Followup in one month

## 2011-11-09 NOTE — Patient Instructions (Signed)
Try to be as active as possible. Start PT

## 2011-11-13 ENCOUNTER — Telehealth: Payer: Self-pay | Admitting: *Deleted

## 2011-11-13 DIAGNOSIS — M542 Cervicalgia: Secondary | ICD-10-CM

## 2011-11-13 DIAGNOSIS — M545 Low back pain: Secondary | ICD-10-CM

## 2011-11-13 DIAGNOSIS — M25519 Pain in unspecified shoulder: Secondary | ICD-10-CM

## 2011-11-13 NOTE — Telephone Encounter (Signed)
Wants Clydie Braun to make referral to [facility] for PT.

## 2011-11-14 NOTE — Telephone Encounter (Signed)
Order has been placed.

## 2011-11-20 ENCOUNTER — Telehealth: Payer: Self-pay | Admitting: Physical Medicine and Rehabilitation

## 2011-11-20 NOTE — Telephone Encounter (Signed)
Wants therapy set up at [facility] [phone]

## 2011-11-30 ENCOUNTER — Encounter: Payer: Self-pay | Admitting: Physical Medicine and Rehabilitation

## 2011-12-01 ENCOUNTER — Encounter: Payer: Self-pay | Admitting: Physical Medicine and Rehabilitation

## 2011-12-07 ENCOUNTER — Telehealth: Payer: Self-pay

## 2011-12-07 MED ORDER — OXYCODONE-ACETAMINOPHEN 5-325 MG PO TABS: 1.0000 | ORAL_TABLET | Freq: Every evening | ORAL | Status: DC | PRN

## 2011-12-07 NOTE — Telephone Encounter (Signed)
Patient calling for refill and to reschedule.

## 2011-12-07 NOTE — Telephone Encounter (Signed)
She gets the prescription with oxy 5mg  , 1 tablet at hs , # 30

## 2011-12-07 NOTE — Telephone Encounter (Signed)
Mariah Morris, there are two percocet listed and I didn't know which to refill. She is going to reschedule her appointment.

## 2011-12-08 ENCOUNTER — Ambulatory Visit: Payer: Medicaid Other | Admitting: Physical Medicine and Rehabilitation

## 2011-12-08 ENCOUNTER — Encounter: Payer: Self-pay | Admitting: Physical Medicine and Rehabilitation

## 2011-12-08 NOTE — Telephone Encounter (Signed)
Mariah Morris canceled her appointment and did not reschedule because her medicaid has been inactivated.  She will schedule an appointment when she gets situated with [employer]. I told her her rx is ready but she will need to schedule an appointment.

## 2011-12-19 ENCOUNTER — Ambulatory Visit: Payer: Self-pay | Admitting: Physical Medicine and Rehabilitation

## 2012-01-03 ENCOUNTER — Encounter: Payer: Self-pay | Attending: Physical Medicine and Rehabilitation | Admitting: Physical Medicine and Rehabilitation

## 2012-01-11 ENCOUNTER — Telehealth: Payer: Self-pay

## 2012-01-11 NOTE — Telephone Encounter (Signed)
Patient called to let us know she is working on getting her finances in [employer].  She needs a refill but did not say on what.  Please call.

## 2012-01-12 MED ORDER — OXYCODONE-ACETAMINOPHEN 5-325 MG PO TABS: 1.0000 | ORAL_TABLET | Freq: Every evening | ORAL | Status: DC | PRN

## 2012-01-12 NOTE — Telephone Encounter (Signed)
Patient called to follow up on medication request.

## 2012-01-12 NOTE — Telephone Encounter (Signed)
Patient needs percocet fill.  Rx filled this time.  Patient has initiated financial assistance with hospital so she can make a follow up appointment.

## 2012-01-12 NOTE — Telephone Encounter (Signed)
Number not available

## 2012-03-04 ENCOUNTER — Encounter: Payer: Self-pay | Admitting: Physical Medicine and Rehabilitation

## 2012-03-04 ENCOUNTER — Encounter
Payer: BC Managed Care – PPO | Attending: Physical Medicine and Rehabilitation | Admitting: Physical Medicine and Rehabilitation

## 2012-03-04 VITALS — BP 137/81 | HR 85 | Resp 14 | Ht 62.0 in | Wt 299.0 lb

## 2012-03-04 DIAGNOSIS — K219 Gastro-esophageal reflux disease without esophagitis: Secondary | ICD-10-CM | POA: Insufficient documentation

## 2012-03-04 DIAGNOSIS — M25519 Pain in unspecified shoulder: Secondary | ICD-10-CM | POA: Insufficient documentation

## 2012-03-04 DIAGNOSIS — M545 Low back pain, unspecified: Secondary | ICD-10-CM | POA: Insufficient documentation

## 2012-03-04 DIAGNOSIS — G35 Multiple sclerosis: Secondary | ICD-10-CM | POA: Insufficient documentation

## 2012-03-04 DIAGNOSIS — M542 Cervicalgia: Secondary | ICD-10-CM | POA: Insufficient documentation

## 2012-03-04 DIAGNOSIS — M4310 Spondylolisthesis, site unspecified: Secondary | ICD-10-CM

## 2012-03-04 DIAGNOSIS — M79609 Pain in unspecified limb: Secondary | ICD-10-CM | POA: Insufficient documentation

## 2012-03-04 DIAGNOSIS — R209 Unspecified disturbances of skin sensation: Secondary | ICD-10-CM | POA: Insufficient documentation

## 2012-03-04 DIAGNOSIS — G8929 Other chronic pain: Secondary | ICD-10-CM | POA: Insufficient documentation

## 2012-03-04 DIAGNOSIS — I10 Essential (primary) hypertension: Secondary | ICD-10-CM | POA: Insufficient documentation

## 2012-03-04 DIAGNOSIS — Q762 Congenital spondylolisthesis: Secondary | ICD-10-CM

## 2012-03-04 MED ORDER — OXYCODONE-ACETAMINOPHEN 5-325 MG PO TABS: 1.0000 | ORAL_TABLET | Freq: Every evening | ORAL | Status: DC | PRN

## 2012-03-04 NOTE — Progress Notes (Signed)
Subjective:    Patient ID: Mariah Morris, female    DOB: 06/12/1970, 42 y.o.   MRN: [id number]  HPI The patient complains about chronic low back pain which radiates into both  legs in a diffuse distribution , she also complains about neck and right shoulder pain. The patient also complains about numbness and tingling in all 4 extremities in a diffuse pattern, worse on the right. The patient states, that she will schedule an appointment with her neurologist, for her increased symptoms of her MS.  The patient reports, that she did not go to PT yet.    Pain Inventory Average Pain 9 Pain Right Now 9 My pain is sharp, dull, stabbing, tingling and aching  In the last 24 hours, has pain interfered with the following? General activity 9 Relation with others 10 Enjoyment of life 10 What TIME of day is your pain at its worst? morning, day and night Sleep (in general) Poor  Pain is worse with: walking, bending, sitting, inactivity and standing Pain improves with: rest, heat/ice and medication Relief from Meds: 7  Mobility ability to climb steps?  yes do you drive?  yes  Function disabled: date disabled 2013  Neuro/Psych weakness trouble walking spasms dizziness confusion depression anxiety  Prior Studies Any changes since last visit?  no  Physicians involved in your care Any changes since last visit?  no   Family History  Problem Relation Age of Onset  . Hypertension Mother   . Diabetes Father   . Hypertension Father    History   Social History  . Marital Status: Unknown    Spouse Name: N/A    Number of Children: N/A  . Years of Education: N/A   Social History Main Topics  . Smoking status: Never Smoker   . Smokeless tobacco: Never Used  . Alcohol Use: None  . Drug Use: None  . Sexually Active: None   Other Topics Concern  . None   Social History Narrative  . None   Past Surgical History  Procedure Date  . Ovarian cyst removal   . Cesarean section      x2   Past Medical History  Diagnosis Date  . Neuromuscular disorder 2011    MS  . Hypertension   . GERD (gastroesophageal reflux disease)   . Gestational diabetes    BP 137/81  Pulse 85  Resp 14  Ht 5\' 2"  (1.575 m)  Wt 299 lb (135.626 kg)  BMI 54.69 kg/m2  SpO2 100%  LMP 02/20/2012     Review of Systems  Musculoskeletal: Positive for back pain and gait problem.  Neurological: Positive for dizziness and weakness.  Psychiatric/Behavioral: Positive for confusion and dysphoric mood. The patient is nervous/anxious.   All other systems reviewed and are negative.       Objective:   Physical Exam Constitutional: She is oriented to person, place, and time. She appears well-developed and well-nourished.  Morbidly obese  HENT:  Head: Normocephalic.  Neck: Neck supple.  Musculoskeletal: She exhibits tenderness.  Neurological: She is alert and oriented to person, place, and time.  Skin: Skin is warm and dry.  Psychiatric: She has a normal mood and affect.  Symmetric normal motor tone is noted throughout. Normal muscle bulk. Muscle testing reveals 5/5 muscle strength of the upper extremity, and 5/5 of the lower extremity. Full range of motion in upper and lower extremities. ROM of spine is restricted. Fine motor movements are normal in both hands.  Sensory is decreased to  light touch, on the right.  DTR in the upper and lower extremity are present and symmetric 2+ except right patella 3+. No clonus is noted.  Patient arises from chair with difficulty. Wide based gait with decreased arm swing bilateral , able to stand on heels and toes . Tandem walk is possible but unstable. No pronator drift. Rhomberg negative. No tremor, dystaxia or dysmetria noted.         Assessment & Plan:  1.MS  2. Low back pain  3. Right sided pain, neck, shoulder back and leg in a diffuse, non radicular pattern  4. Decreased sensitivity on right side  MRI of the right shoulder was unremarkable MRI  of the cervical spine was without significant findings, MRI of the lumbar spine showed a mild grade 1 Spondylolisthesis of L4 on L5.  The diffuse symptoms of the patient, are most likely coming from her MS.  I advised the patient to continue with her walking program, and continue exercising. She states that she did not do the physical therapy.  I refilled her oxycodone 5 mg, increased it from 1/2 a tablet at nighttime, to one tablet at hs, prn, at the last visit, and ordered PT again, educated patient about the importance of PT, she understood and will start it now.  Advised patient to be as active as tolerated, also discussed her weight gain, she gained 32 lbs since last April. Advised patient to schedule an appointment with her neurologist, for her increasing symptoms. 25 min spent with patient face to face. Followup in one month

## 2012-03-04 NOTE — Patient Instructions (Signed)
Follow up with your neurologist for your increased symptoms.

## 2012-03-28 ENCOUNTER — Encounter: Payer: Self-pay | Admitting: Physical Medicine and Rehabilitation

## 2012-03-28 ENCOUNTER — Encounter
Payer: BC Managed Care – PPO | Attending: Physical Medicine and Rehabilitation | Admitting: Physical Medicine and Rehabilitation

## 2012-03-28 VITALS — BP 138/70 | HR 93 | Resp 14 | Ht 62.0 in | Wt 293.0 lb

## 2012-03-28 DIAGNOSIS — R209 Unspecified disturbances of skin sensation: Secondary | ICD-10-CM | POA: Insufficient documentation

## 2012-03-28 DIAGNOSIS — M542 Cervicalgia: Secondary | ICD-10-CM | POA: Insufficient documentation

## 2012-03-28 DIAGNOSIS — M4310 Spondylolisthesis, site unspecified: Secondary | ICD-10-CM

## 2012-03-28 DIAGNOSIS — M545 Low back pain, unspecified: Secondary | ICD-10-CM | POA: Insufficient documentation

## 2012-03-28 DIAGNOSIS — I10 Essential (primary) hypertension: Secondary | ICD-10-CM | POA: Insufficient documentation

## 2012-03-28 DIAGNOSIS — Q762 Congenital spondylolisthesis: Secondary | ICD-10-CM

## 2012-03-28 DIAGNOSIS — G35 Multiple sclerosis: Secondary | ICD-10-CM | POA: Insufficient documentation

## 2012-03-28 DIAGNOSIS — G8929 Other chronic pain: Secondary | ICD-10-CM | POA: Insufficient documentation

## 2012-03-28 DIAGNOSIS — M25519 Pain in unspecified shoulder: Secondary | ICD-10-CM | POA: Insufficient documentation

## 2012-03-28 DIAGNOSIS — M79609 Pain in unspecified limb: Secondary | ICD-10-CM | POA: Insufficient documentation

## 2012-03-28 DIAGNOSIS — K219 Gastro-esophageal reflux disease without esophagitis: Secondary | ICD-10-CM | POA: Insufficient documentation

## 2012-03-28 MED ORDER — OXYCODONE-ACETAMINOPHEN 5-325 MG PO TABS: 1.0000 | ORAL_TABLET | Freq: Every evening | ORAL | Status: DC | PRN

## 2012-03-28 NOTE — Progress Notes (Signed)
Subjective:    Patient ID: Mariah Morris, female    DOB: 1970/02/07, 42 y.o.   MRN: [id number]  HPI The patient complains about chronic low back pain which radiates into both legs in a diffuse distribution , she also complains about neck and right shoulder pain. The patient also complains about numbness and tingling in all 4 extremities in a diffuse pattern, worse on the right. The patient states, that she will schedule an appointment with her neurologist, for her increased symptoms of her MS. The patient reports, that she will start PT next week, and that she will follow up with her neurologist, Dr. Clelia Croft, also next week.    Pain Inventory Average Pain 8 Pain Right Now 8 My pain is constant, sharp, stabbing and aching  In the last 24 hours, has pain interfered with the following? General activity 8 Relation with others 6 Enjoyment of life 6 What TIME of day is your pain at its worst? morning and night Sleep (in general) Fair  Pain is worse with: sitting and standing Pain improves with: rest, heat/ice and medication Relief from Meds: 8  Mobility Do you have any goals in this area?  no  Function Do you have any goals in this area?  no  Neuro/Psych weakness numbness tingling trouble walking spasms dizziness confusion depression anxiety  Prior Studies Any changes since last visit?  no  Physicians involved in your care Any changes since last visit?  no   Family History  Problem Relation Age of Onset  . Hypertension Mother   . Diabetes Father   . Hypertension Father    History   Social History  . Marital Status: Unknown    Spouse Name: N/A    Number of Children: N/A  . Years of Education: N/A   Social History Main Topics  . Smoking status: Never Smoker   . Smokeless tobacco: Never Used  . Alcohol Use: None  . Drug Use: None  . Sexually Active: None   Other Topics Concern  . None   Social History Narrative  . None   Past Surgical History   Procedure Laterality Date  . Ovarian cyst removal    . Cesarean section      x2   Past Medical History  Diagnosis Date  . Neuromuscular disorder 2011    MS  . Hypertension   . GERD (gastroesophageal reflux disease)   . Gestational diabetes    BP 138/70  Pulse 93  Resp 14  Ht 5\' 2"  (1.575 m)  Wt 293 lb (132.904 kg)  BMI 53.58 kg/m2  SpO2 99%  LMP 02/20/2012     Review of Systems  Constitutional: Positive for fever, chills, diaphoresis and unexpected weight change.  HENT: Positive for neck pain.   Respiratory: Positive for cough.   Gastrointestinal: Positive for nausea.  Musculoskeletal: Positive for myalgias, back pain, arthralgias and gait problem.  Neurological: Positive for dizziness, weakness and numbness.  Psychiatric/Behavioral: Positive for confusion and dysphoric mood. The patient is nervous/anxious.   All other systems reviewed and are negative.       Objective:   Physical Exam Constitutional: She is oriented to person, place, and time. She appears well-developed and well-nourished.  Morbidly obese  HENT:  Head: Normocephalic.  Neck: Neck supple.  Musculoskeletal: She exhibits tenderness.  Neurological: She is alert and oriented to person, place, and time.  Skin: Skin is warm and dry.  Psychiatric: She has a normal mood and affect.  Symmetric normal motor tone is  noted throughout. Normal muscle bulk. Muscle testing reveals 5/5 muscle strength of the upper extremity, and 5/5 of the lower extremity. Full range of motion in upper and lower extremities. ROM of spine is restricted. Fine motor movements are normal in both hands.  Sensory is decreased to light touch, on the right.  DTR in the upper and lower extremity are present and symmetric 2+ except right patella 3+. No clonus is noted.  Patient arises from chair with difficulty. Wide based gait with decreased arm swing bilateral , able to stand on heels and toes . Tandem walk is possible but unstable. No  pronator drift. Rhomberg negative. No tremor, dystaxia or dysmetria noted.         Assessment & Plan:  1.MS  2. Low back pain  3. Right sided pain, neck, shoulder back and leg in a diffuse, non radicular pattern  4. Decreased sensitivity on right side  5. MRI of the lumbar spine showed a mild grade 1 Spondylolisthesis of L4 on L5.  MRI of the right shoulder was unremarkable MRI of the cervical spine was without significant findings,   The diffuse symptoms of the patient, are most likely coming from her MS.  I advised the patient to continue with her walking program, and continue exercising.  I refilled her oxycodone 5 mg, increased it from 1/2 a tablet at nighttime, to one tablet at hs, prn, at the last visit, and ordered PT again, educated patient about the importance of PT,at last visit.  Advised patient to be as active as tolerated, also discussed her weight gain, she lost 6 lbs since last visit.She will start PT next week, and that she will follow up with her neurologist, Dr. Clelia Croft, also next week.      Followup in one month

## 2012-03-28 NOTE — Patient Instructions (Signed)
Continue with your exercises and your walking in a safe way. Start with your PT.

## 2012-04-22 ENCOUNTER — Encounter
Payer: BC Managed Care – PPO | Attending: Physical Medicine and Rehabilitation | Admitting: Physical Medicine and Rehabilitation

## 2012-04-22 ENCOUNTER — Encounter: Payer: Self-pay | Admitting: Physical Medicine and Rehabilitation

## 2012-04-22 VITALS — BP 124/80 | HR 89 | Resp 14 | Ht 62.0 in | Wt 297.0 lb

## 2012-04-22 DIAGNOSIS — M545 Low back pain, unspecified: Secondary | ICD-10-CM | POA: Insufficient documentation

## 2012-04-22 DIAGNOSIS — M25519 Pain in unspecified shoulder: Secondary | ICD-10-CM | POA: Insufficient documentation

## 2012-04-22 DIAGNOSIS — G8929 Other chronic pain: Secondary | ICD-10-CM | POA: Insufficient documentation

## 2012-04-22 DIAGNOSIS — G35 Multiple sclerosis: Secondary | ICD-10-CM | POA: Insufficient documentation

## 2012-04-22 DIAGNOSIS — M542 Cervicalgia: Secondary | ICD-10-CM | POA: Insufficient documentation

## 2012-04-22 MED ORDER — OXYCODONE-ACETAMINOPHEN 5-325 MG PO TABS: 1.0000 | ORAL_TABLET | Freq: Every evening | ORAL | Status: DC | PRN

## 2012-04-22 NOTE — Patient Instructions (Signed)
Follow up with your neurologist, after you have had your MRI of the brain. Start with PT.

## 2012-04-22 NOTE — Progress Notes (Signed)
Subjective:    Patient ID: Mariah Morris, female    DOB: 01-18-1971, 42 y.o.   MRN: [id number]  HPI The patient complains about chronic low back pain which radiates into both legs in a diffuse distribution , she also complains about neck and right shoulder pain. The patient also complains about numbness and tingling in all 4 extremities in a diffuse pattern, worse on the right. The patient states, that she has followed up with her neurologist, for her increased symptoms of her MS, he ordered a MRI of the brain, which is scheduled for 05/03/2012. The patient reports, that she will start PT next month.   Pain Inventory Average Pain 10 Pain Right Now 10 My pain is constant, sharp, burning, dull, stabbing, tingling and aching  In the last 24 hours, has pain interfered with the following? General activity 4 Relation with others 1 Enjoyment of life 1 What TIME of day is your pain at its worst? all the time Sleep (in general) Poor  Pain is worse with: bending, sitting and standing Pain improves with: rest, heat/ice and medication Relief from Meds: 6  Mobility Do you have any goals in this area?  yes  Function Do you have any goals in this area?  yes  Neuro/Psych weakness numbness tingling trouble walking spasms dizziness confusion depression anxiety  Prior Studies Any changes since last visit?  no  Physicians involved in your care Any changes since last visit?  no   Family History  Problem Relation Age of Onset  . Hypertension Mother   . Diabetes Father   . Hypertension Father    History   Social History  . Marital Status: Unknown    Spouse Name: N/A    Number of Children: N/A  . Years of Education: N/A   Social History Main Topics  . Smoking status: Never Smoker   . Smokeless tobacco: Never Used  . Alcohol Use: None  . Drug Use: None  . Sexually Active: None   Other Topics Concern  . None   Social History Narrative  . None   Past Surgical History   Procedure Laterality Date  . Ovarian cyst removal    . Cesarean section      x2   Past Medical History  Diagnosis Date  . Neuromuscular disorder 2011    MS  . Hypertension   . GERD (gastroesophageal reflux disease)   . Gestational diabetes    BP 124/80  Pulse 89  Resp 14  Ht 5\' 2"  (1.575 m)  Wt 297 lb (134.718 kg)  BMI 54.31 kg/m2  SpO2 100%     Review of Systems  Musculoskeletal: Positive for back pain and gait problem.  Neurological: Positive for weakness and numbness.  Psychiatric/Behavioral: Positive for confusion and dysphoric mood. The patient is nervous/anxious.   All other systems reviewed and are negative.       Objective:   Physical Exam Constitutional: She is oriented to person, place, and time. She appears well-developed and well-nourished.  Morbidly obese  HENT:  Head: Normocephalic.  Neck: Neck supple.  Musculoskeletal: She exhibits tenderness.  Neurological: She is alert and oriented to person, place, and time.  Skin: Skin is warm and dry.  Psychiatric: She has a normal mood and affect.  Symmetric normal motor tone is noted throughout. Normal muscle bulk. Muscle testing reveals 5/5 muscle strength of the upper extremity, and 5/5 of the lower extremity. Full range of motion in upper and lower extremities. ROM of spine is restricted.  Fine motor movements are normal in both hands.  Sensory is decreased to light touch, on the right.  DTR in the upper and lower extremity are present and symmetric 2+ except right patella 3+. No clonus is noted.  Patient arises from chair with difficulty. Wide based gait with decreased arm swing bilateral , able to stand on heels and toes . Tandem walk is possible but unstable. No pronator drift. Rhomberg negative. No tremor, dystaxia or dysmetria noted.         Assessment & Plan:  1.MS  2. Low back pain  3. Right sided pain, neck, shoulder back and leg in a diffuse, non radicular pattern  4. Decreased sensitivity on  right side  5. MRI of the lumbar spine showed a mild grade 1 Spondylolisthesis of L4 on L5.  MRI of the right shoulder was unremarkable MRI of the cervical spine was without significant findings,  The diffuse symptoms of the patient, are most likely coming from her MS.  I advised the patient to continue with her walking program, and continue exercising.  I refilled her oxycodone 5 mg, increased it from 1/2 a tablet at nighttime, to one tablet at hs, prn, at the last visit, and ordered PT again, educated patient about the importance of PT,at last visit.  Advised patient to be as active as tolerated, also discussed her weight gain, she lost 6 lbs since last visit.She will start PT next month.  She has followed up with her neurologist, Dr. Clelia Croft, he also thinks that her increased symptoms/pain is coming from her MS, he ordered a MRI of her brain, this is scheduled for 05/03/12.

## 2012-04-23 ENCOUNTER — Encounter: Payer: BC Managed Care – PPO | Admitting: Physical Medicine and Rehabilitation

## 2012-05-22 ENCOUNTER — Encounter
Payer: BC Managed Care – PPO | Attending: Physical Medicine and Rehabilitation | Admitting: Physical Medicine and Rehabilitation

## 2012-05-22 ENCOUNTER — Encounter: Payer: Self-pay | Admitting: Physical Medicine and Rehabilitation

## 2012-05-22 VITALS — BP 143/90 | HR 72 | Resp 14 | Ht 62.0 in | Wt 300.0 lb

## 2012-05-22 DIAGNOSIS — Z5181 Encounter for therapeutic drug level monitoring: Secondary | ICD-10-CM | POA: Insufficient documentation

## 2012-05-22 DIAGNOSIS — Z79899 Other long term (current) drug therapy: Secondary | ICD-10-CM | POA: Insufficient documentation

## 2012-05-22 DIAGNOSIS — G35 Multiple sclerosis: Secondary | ICD-10-CM | POA: Insufficient documentation

## 2012-05-22 DIAGNOSIS — M549 Dorsalgia, unspecified: Secondary | ICD-10-CM | POA: Insufficient documentation

## 2012-05-22 DIAGNOSIS — M4310 Spondylolisthesis, site unspecified: Secondary | ICD-10-CM | POA: Insufficient documentation

## 2012-05-22 MED ORDER — OXYCODONE-ACETAMINOPHEN 5-325 MG PO TABS: 1.0000 | ORAL_TABLET | Freq: Every evening | ORAL | Status: DC | PRN

## 2012-05-22 NOTE — Patient Instructions (Signed)
Try to stay as active as tolerated, try to start with PT.

## 2012-05-22 NOTE — Progress Notes (Signed)
Subjective:    Patient ID: Mariah Morris, female    DOB: 1970-11-16, 42 y.o.   MRN: [id number]  HPI The patient complains about chronic low back pain which radiates into both legs in a diffuse distribution , she also complains about neck and right shoulder pain. The patient also complains about numbness and tingling in all 4 extremities in a diffuse pattern, worse on the right. The patient states, that she has followed up with her neurologist, for her increased symptoms of her MS, he ordered a MRI of the brain, which did not show any new lesions . The patient reports, that she still has not started PT .   Pain Inventory Average Pain 8 Pain Right Now 8 My pain is constant, burning, dull, stabbing and aching  In the last 24 hours, has pain interfered with the following? General activity 5 Relation with others 3 Enjoyment of life 3 What TIME of day is your pain at its worst? varies Sleep (in general) Fair  Pain is worse with: walking, bending, sitting, standing and some activites Pain improves with: rest, heat/ice and medication Relief from Meds: 7  Mobility use a cane ability to climb steps?  no do you drive?  yes Do you have any goals in this area?  yes  Function Do you have any goals in this area?  no  Neuro/Psych weakness numbness tingling trouble walking spasms dizziness confusion depression anxiety  Prior Studies Any changes since last visit?  yes  Physicians involved in your care Any changes since last visit?  no   Family History  Problem Relation Age of Onset  . Hypertension Mother   . Diabetes Father   . Hypertension Father    History   Social History  . Marital Status: Unknown    Spouse Name: N/A    Number of Children: N/A  . Years of Education: N/A   Social History Main Topics  . Smoking status: Never Smoker   . Smokeless tobacco: Never Used  . Alcohol Use: None  . Drug Use: None  . Sexually Active: None   Other Topics Concern  . None    Social History Narrative  . None   Past Surgical History  Procedure Laterality Date  . Ovarian cyst removal    . Cesarean section      x2   Past Medical History  Diagnosis Date  . Neuromuscular disorder 2011    MS  . Hypertension   . GERD (gastroesophageal reflux disease)   . Gestational diabetes    BP 143/90  Pulse 72  Resp 14  Ht 5\' 2"  (1.575 m)  Wt 300 lb (136.079 kg)  BMI 54.86 kg/m2  SpO2 99%  LMP 05/04/2012     Review of Systems  Respiratory: Positive for cough.   Musculoskeletal: Positive for back pain and gait problem.  Neurological: Positive for dizziness, weakness and numbness.  Psychiatric/Behavioral: Positive for confusion and dysphoric mood. The patient is nervous/anxious.   All other systems reviewed and are negative.       Objective:   Physical Exam Constitutional: She is oriented to person, place, and time. She appears well-developed and well-nourished.  Morbidly obese  HENT:  Head: Normocephalic.  Neck: Neck supple.  Musculoskeletal: She exhibits tenderness.  Neurological: She is alert and oriented to person, place, and time.  Skin: Skin is warm and dry.  Psychiatric: She has a normal mood and affect.  Symmetric normal motor tone is noted throughout. Normal muscle bulk. Muscle testing reveals  5/5 muscle strength of the upper extremity, and 5/5 of the lower extremity. Full range of motion in upper and lower extremities. ROM of spine is restricted. Fine motor movements are normal in both hands.  Sensory is decreased to light touch, on the right.  DTR in the upper and lower extremity are present and symmetric 2+ except right patella 3+. No clonus is noted.  Patient arises from chair with difficulty. Wide based gait with decreased arm swing bilateral , able to stand on heels and toes . Tandem walk is possible but unstable. No pronator drift. Rhomberg negative. No tremor, dystaxia or dysmetria noted.         Assessment & Plan:  1.MS  2. Low  back pain  3. Right sided pain, neck, shoulder back and leg in a diffuse, non radicular pattern, most likely MS related  4. Decreased sensitivity on right side  5. MRI of the lumbar spine showed a mild grade 1 Spondylolisthesis of L4 on L5.  MRI of the right shoulder was unremarkable MRI of the cervical spine was without significant findings,  The diffuse symptoms of the patient, are most likely coming from her MS.  I advised the patient to continue with her walking program, and continue exercising.  I refilled her oxycodone 5 mg, increased it from 1/2 a tablet at nighttime, to one tablet at hs, prn, at the last visit, and ordered PT again,this time I ordered aquatic PT, the patient is more motivated to do aquatic therapy. I educated patient about the importance of PT,and staying active, and being proactive with her problems again. The patient would like to increase her oxycodone again, I recommended to try the PT , hopefully loosing weight and being more active first. She would like to talk to Dr. Pamelia Hoit next visit about a possible increase. Advised patient to be as active as tolerated, also discussed her weight, she lost 6 lbs since last visit.  She has followed up with her neurologist, Dr. Clelia Croft, he also thinks that her increased symptoms/pain is coming from her MS, he ordered a MRI of her brain, this was scheduled for 05/03/12, it did not show any new lesions per patient.

## 2012-06-19 ENCOUNTER — Encounter: Payer: Self-pay | Admitting: Physical Medicine and Rehabilitation

## 2012-06-19 ENCOUNTER — Encounter
Payer: BC Managed Care – PPO | Attending: Physical Medicine and Rehabilitation | Admitting: Physical Medicine and Rehabilitation

## 2012-06-19 VITALS — BP 137/78 | HR 84 | Resp 14 | Ht 62.0 in | Wt 296.2 lb

## 2012-06-19 DIAGNOSIS — M545 Low back pain, unspecified: Secondary | ICD-10-CM | POA: Insufficient documentation

## 2012-06-19 DIAGNOSIS — R269 Unspecified abnormalities of gait and mobility: Secondary | ICD-10-CM | POA: Insufficient documentation

## 2012-06-19 DIAGNOSIS — M79604 Pain in right leg: Secondary | ICD-10-CM

## 2012-06-19 DIAGNOSIS — R2689 Other abnormalities of gait and mobility: Secondary | ICD-10-CM

## 2012-06-19 MED ORDER — OXYCODONE-ACETAMINOPHEN 5-325 MG PO TABS: 1.0000 | ORAL_TABLET | Freq: Two times a day (BID) | ORAL | Status: DC

## 2012-06-19 NOTE — Patient Instructions (Signed)
I have reordered your pain medication.  Please keep it locked up and in a secure location.  Take it as prescribed do not take more than bottle states.  I have ordered back x-rays as well as a low back MRI to evaluate your ongoing back pain and leg pain and difficulty walking.  I understand you have MS but want to make sure you have nothing else that is getting in the way of you being able to walk.  Follow up in one month

## 2012-06-19 NOTE — Progress Notes (Signed)
Subjective:    Patient ID: Mariah Morris, female    DOB: 03-15-1970, 42 y.o.   MRN: [id number]  HPI  The patient complains about chronic low back pain which radiates into both legs in a diffuse distribution , she also complains about neck and right shoulder pain. The patient also complains about numbness and tingling in all 4 extremities in a diffuse pattern, worse on the right. The patient states, that she has followed up with her neurologist, for her increased symptoms of her MS, he ordered a MRI of the brain, which did not show any new lesions . The patient reports, that she still has not started PT .   She indicates that her back and leg pain are worse when she is up walking or standing.  She is able to walk about 5 minutes. Pain typically improves with rest.  She reports overall worsening in her back and leg pain over the last several months.    Pain Inventory Average Pain 9 Pain Right Now 9 My pain is constant, burning, dull, tingling and aching  In the last 24 hours, has pain interfered with the following? General activity 0 Relation with others 0 Enjoyment of life 0 What TIME of day is your pain at its worst? morning and night Sleep (in general) Poor  Pain is worse with: bending and standing Pain improves with: rest, heat/ice, therapy/exercise and medication Relief from Meds: 9  Mobility walk without assistance ability to climb steps?  no do you drive?  yes  Function disabled: date disabled . I need assistance with the following:  meal prep, household duties and shopping  Neuro/Psych weakness numbness tingling trouble walking spasms  Prior Studies Any changes since last visit?  no  Physicians involved in your care Any changes since last visit?  no   Family History  Problem Relation Age of Onset  . Hypertension Mother   . Diabetes Father   . Hypertension Father    History   Social History  . Marital Status: Unknown    Spouse Name: N/A     Number of Children: N/A  . Years of Education: N/A   Social History Main Topics  . Smoking status: Never Smoker   . Smokeless tobacco: Never Used  . Alcohol Use: None  . Drug Use: None  . Sexually Active: None   Other Topics Concern  . None   Social History Narrative  . None   Past Surgical History  Procedure Laterality Date  . Ovarian cyst removal    . Cesarean section      x2   Past Medical History  Diagnosis Date  . Neuromuscular disorder 2011    MS  . Hypertension   . GERD (gastroesophageal reflux disease)   . Gestational diabetes    BP 137/78  Pulse 84  Resp 14  Ht 5\' 2"  (1.575 m)  Wt 296 lb 3.2 oz (134.355 kg)  BMI 54.16 kg/m2  SpO2 98%  LMP 05/04/2012   Review of Systems  Musculoskeletal: Positive for back pain and gait problem.       Spasms  Neurological: Positive for weakness and numbness.       Tingling  All other systems reviewed and are negative.       Objective:   Physical Exam Patient has difficulty climbing onto the exam table safely therefore she was examined in her chair today. Constitutional: She is oriented to person, place, and time. She appears well-developed and well-nourished.  Morbidly obese  HENT:  Head: Normocephalic.  Neck: Neck supple.  Musculoskeletal: She exhibits tenderness.  Neurological: She is alert and oriented to person, place, and time.  Skin: Skin is warm and dry.  Psychiatric: She has a normal mood and affect.  Symmetric normal motor tone is noted throughout. Normal muscle bulk. Muscle testing reveals 5/5 muscle strength of the upper extremity, and 5/5 of the lower extremity. Full range of motion in upper and lower extremities. ROM of spine is restricted. Fine motor movements are normal in both hands.  Sensory is decreased to light touch, on the right.  DTR in the upper and lower extremity are present and symmetric 2+ except right patella 3+. No clonus is noted.  Patient arises from chair with  difficulty.  Patient stands with forward flexed lumbar spine. She has difficulty straightening to neutral position.   Wide based gait with decreased arm swing bilateral , able to stand on heels and toes . Tandem walk is possible but unstable. No pronator drift. Rhomberg negative. No tremor, dystaxia or dysmetria noted.          Assessment & Plan:  1. Chronic low back pain/decreased ambulation-standing capacity/forward flexed lumbar spine during standing and walking.  Radiographs from last July 2013 showed grade 1 anterolisthesis at L4-5. I was not able to find recent lumbar MRI. ?increaseing stenosis.  Will check Lumbar flx/ext films and lumbar MRI without contrast.  Pt reports topamax worsened BP Lyrica causes wt gain  Gabapentin makes her too drowsy and does not help with pain.  2. MS   3. Right sided pain, neck, shoulder back and leg in a diffuse, non radicular pattern, most likely MS related  4. Decreased sensitivity on right side   Will increase percocet to5/325 bid #60.  F/u after MRI/xrays.

## 2012-07-19 ENCOUNTER — Encounter
Payer: BC Managed Care – PPO | Attending: Physical Medicine and Rehabilitation | Admitting: Physical Medicine and Rehabilitation

## 2012-07-19 ENCOUNTER — Encounter: Payer: Self-pay | Admitting: Physical Medicine and Rehabilitation

## 2012-07-19 VITALS — BP 133/83 | HR 79 | Resp 16 | Ht 62.0 in | Wt 290.0 lb

## 2012-07-19 DIAGNOSIS — M25519 Pain in unspecified shoulder: Secondary | ICD-10-CM | POA: Insufficient documentation

## 2012-07-19 DIAGNOSIS — G35 Multiple sclerosis: Secondary | ICD-10-CM | POA: Insufficient documentation

## 2012-07-19 DIAGNOSIS — M4310 Spondylolisthesis, site unspecified: Secondary | ICD-10-CM

## 2012-07-19 DIAGNOSIS — M545 Low back pain, unspecified: Secondary | ICD-10-CM | POA: Insufficient documentation

## 2012-07-19 DIAGNOSIS — R209 Unspecified disturbances of skin sensation: Secondary | ICD-10-CM | POA: Insufficient documentation

## 2012-07-19 DIAGNOSIS — G8929 Other chronic pain: Secondary | ICD-10-CM | POA: Insufficient documentation

## 2012-07-19 DIAGNOSIS — I10 Essential (primary) hypertension: Secondary | ICD-10-CM | POA: Insufficient documentation

## 2012-07-19 DIAGNOSIS — Q762 Congenital spondylolisthesis: Secondary | ICD-10-CM | POA: Insufficient documentation

## 2012-07-19 DIAGNOSIS — K219 Gastro-esophageal reflux disease without esophagitis: Secondary | ICD-10-CM | POA: Insufficient documentation

## 2012-07-19 DIAGNOSIS — M542 Cervicalgia: Secondary | ICD-10-CM | POA: Insufficient documentation

## 2012-07-19 DIAGNOSIS — M79609 Pain in unspecified limb: Secondary | ICD-10-CM | POA: Insufficient documentation

## 2012-07-19 DIAGNOSIS — M47817 Spondylosis without myelopathy or radiculopathy, lumbosacral region: Secondary | ICD-10-CM

## 2012-07-19 MED ORDER — OXYCODONE-ACETAMINOPHEN 5-325 MG PO TABS: 1.0000 | ORAL_TABLET | Freq: Two times a day (BID) | ORAL | Status: DC

## 2012-07-19 NOTE — Patient Instructions (Signed)
Continue with your exercise and walking program 

## 2012-07-19 NOTE — Progress Notes (Signed)
Subjective:    Patient ID: Mariah Morris, female    DOB: 04/28/1970, 42 y.o.   MRN: [id number]  HPI The patient complains about chronic low back pain which radiates into both legs in a diffuse distribution , she also complains about neck and right shoulder pain. The patient also complains about numbness and tingling in all 4 extremities in a diffuse pattern, worse on the right. The patient states, that she has followed up with her neurologist, for her increased symptoms of her MS, he ordered a MRI of the brain, which did not show any new lesions . The patient reports, that she has started PT, and that this gives her good relief, she also states that she sleeps much better on the days when she has therapy. She also reports that she would like to continue with PT, but has some difficulties to pay the co pay, she is trying to get this managed somehow .  Pain Inventory Average Pain 7 Pain Right Now na My pain is constant, burning, dull, tingling and aching  In the last 24 hours, has pain interfered with the following? General activity 2 Relation with others 1 Enjoyment of life 2 What TIME of day is your pain at its worst? evening,night Sleep (in general) Fair  Pain is worse with: walking, bending, sitting, standing and some activites Pain improves with: rest, heat/ice, therapy/exercise and medication Relief from Meds: 7  Mobility use a cane how many minutes can you walk? 10 ability to climb steps?  no Do you have any goals in this area?  yes  Function Do you have any goals in this area?  no  Neuro/Psych weakness numbness tingling trouble walking spasms confusion depression anxiety  Prior Studies Any changes since last visit?  no  Physicians involved in your care Any changes since last visit?  no   Family History  Problem Relation Age of Onset  . Hypertension Mother   . Diabetes Father   . Hypertension Father    History   Social History  . Marital Status:  Unknown    Spouse Name: N/A    Number of Children: N/A  . Years of Education: N/A   Social History Main Topics  . Smoking status: Never Smoker   . Smokeless tobacco: Never Used  . Alcohol Use: None  . Drug Use: None  . Sexually Active: None   Other Topics Concern  . None   Social History Narrative  . None   Past Surgical History  Procedure Laterality Date  . Ovarian cyst removal    . Cesarean section      x2   Past Medical History  Diagnosis Date  . Neuromuscular disorder 2011    MS  . Hypertension   . GERD (gastroesophageal reflux disease)   . Gestational diabetes    BP 133/83  Pulse 79  Resp 16  Ht 5\' 2"  (1.575 m)  Wt 290 lb (131.543 kg)  BMI 53.03 kg/m2  SpO2 99%  LMP 07/03/2012     Review of Systems  Musculoskeletal: Positive for gait problem.  Neurological: Positive for weakness.       Tingling,spasms  Psychiatric/Behavioral: Positive for confusion, dysphoric mood and agitation.  All other systems reviewed and are negative.       Objective:   Physical Exam Constitutional: She is oriented to person, place, and time. She appears well-developed and well-nourished.  Morbidly obese  HENT:  Head: Normocephalic.  Neck: Neck supple.  Musculoskeletal: She exhibits tenderness.  Neurological:  She is alert and oriented to person, place, and time.  Skin: Skin is warm and dry.  Psychiatric: She has a normal mood and affect.  Symmetric normal motor tone is noted throughout. Normal muscle bulk. Muscle testing reveals 5/5 muscle strength of the upper extremity, and 5/5 of the lower extremity. Full range of motion in upper and lower extremities. ROM of spine is restricted. Fine motor movements are normal in both hands.  Sensory is decreased to light touch, on the right.  DTR in the upper and lower extremity are present and symmetric 2+ except right patella 3+. No clonus is noted.  Patient arises from chair with difficulty. Wide based gait with decreased arm  swing bilateral , able to stand on heels and toes . Tandem walk is possible but unstable. No pronator drift. Rhomberg negative. No tremor, dystaxia or dysmetria noted.         Assessment & Plan:  1.MS  2. Low back pain  3. Right sided pain, neck, shoulder back and leg in a diffuse, non radicular pattern, most likely MS related  4. Decreased sensitivity on right side  5. MRI of the lumbar spine showed a mild grade 1 Spondylolisthesis of L4 on L5.  MRI of the right shoulder was unremarkable MRI of the cervical spine was without significant findings,  The diffuse symptoms of the patient, are most likely coming from her MS.  I advised the patient to continue with her walking program, and continue exercising.  I refilled her oxycodone 5 mg bid, increased from 1 tablet at nighttime, to bid .She has started PT, the patient is very motivated to do the PT and aquatic therapy, but she has some trouble to come up with the co pay. She states, that the PT gives her great relief.. I educated patient about the importance of PT,and staying active, and being proactive with her problems again.   Advised patient to be as active as tolerated, also discussed her weight, she lost another 6 lbs since last visit.  She has followed up with her neurologist, Dr. Clelia Croft, he also thinks that her increased symptoms/pain is coming from her MS, he ordered a MRI of her brain, this was scheduled for 05/03/12, it did not show any new lesions per patient.  Follow up in one month.

## 2012-08-19 ENCOUNTER — Encounter: Payer: Self-pay | Admitting: Physical Medicine and Rehabilitation

## 2012-08-19 ENCOUNTER — Encounter
Payer: BC Managed Care – PPO | Attending: Physical Medicine and Rehabilitation | Admitting: Physical Medicine and Rehabilitation

## 2012-08-19 VITALS — BP 134/69 | HR 95 | Resp 14 | Ht 62.0 in | Wt 292.0 lb

## 2012-08-19 DIAGNOSIS — M79609 Pain in unspecified limb: Secondary | ICD-10-CM | POA: Insufficient documentation

## 2012-08-19 DIAGNOSIS — K219 Gastro-esophageal reflux disease without esophagitis: Secondary | ICD-10-CM | POA: Insufficient documentation

## 2012-08-19 DIAGNOSIS — M4310 Spondylolisthesis, site unspecified: Secondary | ICD-10-CM

## 2012-08-19 DIAGNOSIS — M542 Cervicalgia: Secondary | ICD-10-CM | POA: Insufficient documentation

## 2012-08-19 DIAGNOSIS — R209 Unspecified disturbances of skin sensation: Secondary | ICD-10-CM | POA: Insufficient documentation

## 2012-08-19 DIAGNOSIS — M25519 Pain in unspecified shoulder: Secondary | ICD-10-CM | POA: Insufficient documentation

## 2012-08-19 DIAGNOSIS — M545 Low back pain, unspecified: Secondary | ICD-10-CM | POA: Insufficient documentation

## 2012-08-19 DIAGNOSIS — G8929 Other chronic pain: Secondary | ICD-10-CM | POA: Insufficient documentation

## 2012-08-19 DIAGNOSIS — M47817 Spondylosis without myelopathy or radiculopathy, lumbosacral region: Secondary | ICD-10-CM

## 2012-08-19 DIAGNOSIS — Q762 Congenital spondylolisthesis: Secondary | ICD-10-CM

## 2012-08-19 DIAGNOSIS — G35 Multiple sclerosis: Secondary | ICD-10-CM | POA: Insufficient documentation

## 2012-08-19 DIAGNOSIS — I10 Essential (primary) hypertension: Secondary | ICD-10-CM | POA: Insufficient documentation

## 2012-08-19 MED ORDER — OXYCODONE-ACETAMINOPHEN 5-325 MG PO TABS: 1.0000 | ORAL_TABLET | Freq: Two times a day (BID) | ORAL | Status: DC

## 2012-08-19 NOTE — Patient Instructions (Signed)
Continue With your aquatic therapy,

## 2012-08-19 NOTE — Progress Notes (Signed)
Subjective:    Patient ID: Mariah Morris, female    DOB: 1970-09-29, 42 y.o.   MRN: [id number]  HPI The patient complains about chronic low back pain which radiates into both legs in a diffuse distribution , she also complains about neck and right shoulder pain. The patient also complains about numbness and tingling in all 4 extremities in a diffuse pattern, worse on the right. The patient states, that she has followed up with her neurologist, for her increased symptoms of her MS, he ordered a MRI of the brain, which did not show any new lesions . The patient reports, that she has started PT, and that this gives her good relief, she also states that she sleeps much better on the days when she has therapy. She also reports that she would like to continue with PT, but has some difficulties to pay the co pay, she is trying to get this managed somehow .  Pain Inventory Average Pain 8 Pain Right Now 8 My pain is constant, burning, dull, stabbing, tingling and aching  In the last 24 hours, has pain interfered with the following? General activity 0 Relation with others 0 Enjoyment of life 0 What TIME of day is your pain at its worst? evening Sleep (in general) Fair  Pain is worse with: bending, sitting and standing Pain improves with: heat/ice, therapy/exercise and medication Relief from Meds: 7  Mobility Do you have any goals in this area?  no  Function Do you have any goals in this area?  no  Neuro/Psych weakness numbness tingling spasms depression anxiety  Prior Studies Any changes since last visit?  no  Physicians involved in your care Any changes since last visit?  no   Family History  Problem Relation Age of Onset  . Hypertension Mother   . Diabetes Father   . Hypertension Father    History   Social History  . Marital Status: Unknown    Spouse Name: N/A    Number of Children: N/A  . Years of Education: N/A   Social History Main Topics  . Smoking status:  Never Smoker   . Smokeless tobacco: Never Used  . Alcohol Use: None  . Drug Use: None  . Sexually Active: None   Other Topics Concern  . None   Social History Narrative  . None   Past Surgical History  Procedure Laterality Date  . Ovarian cyst removal    . Cesarean section      x2   Past Medical History  Diagnosis Date  . Neuromuscular disorder 2011    MS  . Hypertension   . GERD (gastroesophageal reflux disease)   . Gestational diabetes    BP 134/69  Pulse 95  Resp 14  Ht 5\' 2"  (1.575 m)  Wt 292 lb (132.45 kg)  BMI 53.39 kg/m2  SpO2 99%  LMP 08/02/2012     Review of Systems  HENT: Positive for neck pain.   Musculoskeletal: Positive for myalgias, back pain and arthralgias.  Neurological: Positive for weakness and numbness.  Psychiatric/Behavioral: Positive for dysphoric mood. The patient is nervous/anxious.   All other systems reviewed and are negative.       Objective:   Physical Exam Constitutional: She is oriented to person, place, and time. She appears well-developed and well-nourished.  Morbidly obese  HENT:  Head: Normocephalic.  Neck: Neck supple.  Musculoskeletal: She exhibits tenderness.  Neurological: She is alert and oriented to person, place, and time.  Skin: Skin is warm  and dry.  Psychiatric: She has a normal mood and affect.  Symmetric normal motor tone is noted throughout. Normal muscle bulk. Muscle testing reveals 5/5 muscle strength of the upper extremity, and 5/5 of the lower extremity. Full range of motion in upper and lower extremities. ROM of spine is restricted. Fine motor movements are normal in both hands.  Sensory is decreased to light touch, on the right.  DTR in the upper and lower extremity are present and symmetric 2+ except right patella 3+. No clonus is noted.  Patient arises from chair with difficulty. Wide based gait with decreased arm swing bilateral , able to stand on heels and toes . Tandem walk is possible but  unstable. No pronator drift. Rhomberg negative. No tremor, dystaxia or dysmetria noted.         Assessment & Plan:  1.MS  2. Low back pain  3. Right sided pain, neck, shoulder back and leg in a diffuse, non radicular pattern, most likely MS related  4. Decreased sensitivity on right side  5. MRI of the lumbar spine showed a mild grade 1 Spondylolisthesis of L4 on L5.  MRI of the right shoulder was unremarkable MRI of the cervical spine was without significant findings,  The diffuse symptoms of the patient, are most likely coming from her MS.  I advised the patient to continue with her walking program, and continue exercising.  I refilled her oxycodone 5 mg bid, increased from 1 tablet at nighttime, to bid .She has started PT, the patient is very motivated to do the PT and aquatic therapy, but she has some trouble to come up with the co pay. She states, that the PT gives her great relief.. I educated patient about the importance of PT,and staying active, and being proactive with her problems again.  Advised patient to be as active as tolerated, also discussed her weight, she lost another 6 lbs since last visit. She would like to get some information about bariatric surgery, I advised her to talk to her PCP, and maybe that he can send her to a seminar about those surgery. She has followed up with her neurologist, Dr. Clelia Croft, he also thinks that her increased symptoms/pain is coming from her MS, he ordered a MRI of her brain, this was scheduled for 05/03/12, it did not show any new lesions per patient.  Follow up in one month.

## 2012-08-29 ENCOUNTER — Emergency Department: Payer: Self-pay | Admitting: Emergency Medicine

## 2012-08-29 LAB — BASIC METABOLIC PANEL
Anion Gap: 5 — ABNORMAL LOW (ref 7–16)
BUN: 7 mg/dL (ref 7–18)
Calcium, Total: 8.6 mg/dL (ref 8.5–10.1)
Chloride: 107 mmol/L (ref 98–107)
Co2: 27 mmol/L (ref 21–32)
Creatinine: 0.74 mg/dL (ref 0.60–1.30)
EGFR (African American): >60
EGFR (Non-African Amer.): >60
Glucose: 76 mg/dL (ref 65–99)
Osmolality: 274 (ref 275–301)
Potassium: 4.4 mmol/L (ref 3.5–5.1)

## 2012-08-29 LAB — CBC
HGB: 10.7 g/dL — ABNORMAL LOW (ref 12.0–16.0)
MCH: 23.1 pg — ABNORMAL LOW (ref 26.0–34.0)
MCHC: 32.1 g/dL (ref 32.0–36.0)
MCV: 72 fL — ABNORMAL LOW (ref 80–100)
Platelet: 177 10*3/uL (ref 150–440)
RDW: 18.1 % — ABNORMAL HIGH (ref 11.5–14.5)
WBC: 7.8 10*3/uL (ref 3.6–11.0)

## 2012-08-29 LAB — TROPONIN I: Troponin-I: <0.02 ng/mL

## 2012-09-18 ENCOUNTER — Encounter
Payer: BC Managed Care – PPO | Attending: Physical Medicine and Rehabilitation | Admitting: Physical Medicine & Rehabilitation

## 2012-09-18 DIAGNOSIS — M545 Low back pain, unspecified: Secondary | ICD-10-CM | POA: Insufficient documentation

## 2012-09-18 DIAGNOSIS — R269 Unspecified abnormalities of gait and mobility: Secondary | ICD-10-CM | POA: Insufficient documentation

## 2012-09-20 ENCOUNTER — Encounter
Payer: BC Managed Care – PPO | Attending: Physical Medicine & Rehabilitation | Admitting: Physical Medicine & Rehabilitation

## 2012-09-20 ENCOUNTER — Encounter: Payer: Self-pay | Admitting: Physical Medicine & Rehabilitation

## 2012-09-20 VITALS — BP 147/82 | HR 97 | Resp 16 | Ht 62.0 in | Wt 291.0 lb

## 2012-09-20 DIAGNOSIS — F418 Other specified anxiety disorders: Secondary | ICD-10-CM

## 2012-09-20 DIAGNOSIS — M545 Low back pain, unspecified: Secondary | ICD-10-CM | POA: Insufficient documentation

## 2012-09-20 DIAGNOSIS — F411 Generalized anxiety disorder: Secondary | ICD-10-CM | POA: Insufficient documentation

## 2012-09-20 DIAGNOSIS — M792 Neuralgia and neuritis, unspecified: Secondary | ICD-10-CM

## 2012-09-20 DIAGNOSIS — R209 Unspecified disturbances of skin sensation: Secondary | ICD-10-CM | POA: Insufficient documentation

## 2012-09-20 DIAGNOSIS — M25519 Pain in unspecified shoulder: Secondary | ICD-10-CM | POA: Insufficient documentation

## 2012-09-20 DIAGNOSIS — R269 Unspecified abnormalities of gait and mobility: Secondary | ICD-10-CM

## 2012-09-20 DIAGNOSIS — R2689 Other abnormalities of gait and mobility: Secondary | ICD-10-CM

## 2012-09-20 DIAGNOSIS — M79609 Pain in unspecified limb: Secondary | ICD-10-CM | POA: Insufficient documentation

## 2012-09-20 DIAGNOSIS — M62838 Other muscle spasm: Secondary | ICD-10-CM

## 2012-09-20 DIAGNOSIS — M79604 Pain in right leg: Secondary | ICD-10-CM

## 2012-09-20 DIAGNOSIS — F341 Dysthymic disorder: Secondary | ICD-10-CM

## 2012-09-20 DIAGNOSIS — K219 Gastro-esophageal reflux disease without esophagitis: Secondary | ICD-10-CM | POA: Insufficient documentation

## 2012-09-20 DIAGNOSIS — F329 Major depressive disorder, single episode, unspecified: Secondary | ICD-10-CM | POA: Insufficient documentation

## 2012-09-20 DIAGNOSIS — F3289 Other specified depressive episodes: Secondary | ICD-10-CM | POA: Insufficient documentation

## 2012-09-20 DIAGNOSIS — M542 Cervicalgia: Secondary | ICD-10-CM | POA: Insufficient documentation

## 2012-09-20 DIAGNOSIS — Q762 Congenital spondylolisthesis: Secondary | ICD-10-CM | POA: Insufficient documentation

## 2012-09-20 DIAGNOSIS — G35 Multiple sclerosis: Secondary | ICD-10-CM | POA: Insufficient documentation

## 2012-09-20 DIAGNOSIS — G35D Multiple sclerosis, unspecified: Secondary | ICD-10-CM | POA: Insufficient documentation

## 2012-09-20 DIAGNOSIS — M549 Dorsalgia, unspecified: Secondary | ICD-10-CM | POA: Insufficient documentation

## 2012-09-20 DIAGNOSIS — IMO0002 Reserved for concepts with insufficient information to code with codable children: Secondary | ICD-10-CM

## 2012-09-20 DIAGNOSIS — I10 Essential (primary) hypertension: Secondary | ICD-10-CM | POA: Insufficient documentation

## 2012-09-20 MED ORDER — OXCARBAZEPINE 150 MG PO TABS: 300.0000 mg | ORAL_TABLET | Freq: Two times a day (BID) | ORAL | Status: DC

## 2012-09-20 MED ORDER — LIDOCAINE 5 % EX PTCH: 1.0000 | MEDICATED_PATCH | CUTANEOUS | Status: DC

## 2012-09-20 MED ORDER — BACLOFEN 10 MG PO TABS: 10.0000 mg | ORAL_TABLET | Freq: Three times a day (TID) | ORAL | Status: DC

## 2012-09-20 MED ORDER — OXYCODONE-ACETAMINOPHEN 5-325 MG PO TABS: 1.0000 | ORAL_TABLET | Freq: Three times a day (TID) | ORAL | Status: DC | PRN

## 2012-09-20 MED ORDER — GABAPENTIN 600 MG PO TABS: 600.0000 mg | ORAL_TABLET | Freq: Two times a day (BID) | ORAL | Status: DC

## 2012-09-20 NOTE — Progress Notes (Signed)
Subjective:    Patient ID: Mariah Morris, female    DOB: 04-25-1970, 42 y.o.   MRN: [id number]  HPI  [name] is back regarding her MS and low back pain. She has had ongoing pain. She has had more pain over the last few weeks, and hasn't been able to get to therapy. [name] apparently fell over her baby's toy.   She is taking percocet for pain but she doesn't feel that it's helping like it was. Her back and extremities are her biggest problems. She has spasms and burning pain in her exts.   I reviewed an xray from [facility] from June of last year which showed mild spondylolisthesis at L4-5. Otherwise it was read as unremarkable.  She's on rebif for her rx of MS. This dose has been steady for some time.   Pain Inventory Average Pain 10 Pain Right Now 10 My pain is intermittent, sharp, burning, stabbing, tingling and aching  In the last 24 hours, has pain interfered with the following? General activity 0 Relation with others 0 Enjoyment of life 0 What TIME of day is your pain at its worst? night Sleep (in general) Fair  Pain is worse with: walking, bending, sitting and inactivity Pain improves with: rest, heat/ice and medication Relief from Meds: 7  Mobility Do you have any goals in this area?  no  Function disabled: date disabled 2013  Neuro/Psych weakness numbness tingling trouble walking spasms confusion depression anxiety  Prior Studies Any changes since last visit?  no  Physicians involved in your care Any changes since last visit?  no   Family History  Problem Relation Age of Onset  . Hypertension Mother   . Diabetes Father   . Hypertension Father    History   Social History  . Marital Status: Unknown    Spouse Name: N/A    Number of Children: N/A  . Years of Education: N/A   Social History Main Topics  . Smoking status: Never Smoker   . Smokeless tobacco: Never Used  . Alcohol Use: None  . Drug Use: None  . Sexual Activity: None   Other  Topics Concern  . None   Social History Narrative  . None   Past Surgical History  Procedure Laterality Date  . Ovarian cyst removal    . Cesarean section      x2   Past Medical History  Diagnosis Date  . Neuromuscular disorder 2011    MS  . Hypertension   . GERD (gastroesophageal reflux disease)   . Gestational diabetes    BP 147/82  Pulse 97  Resp 16  Ht 5\' 2"  (1.575 m)  Wt 291 lb (131.997 kg)  BMI 53.21 kg/m2  SpO2 97%  LMP 09/01/2012     Review of Systems  Musculoskeletal: Positive for gait problem.  Neurological: Positive for weakness and numbness.  Psychiatric/Behavioral: Positive for confusion and dysphoric mood. The patient is nervous/anxious.   All other systems reviewed and are negative.       Objective:   Physical Exam Constitutional: She is oriented to person, place, and time. She appears well-developed and well-nourished.  Morbidly obese  HENT:  Head: Normocephalic.  Neck: Neck supple.  Musculoskeletal: She exhibits tenderness. In all limbs (no allodynia but hypersensitive), low back tender also. Neurological: She is alert and oriented to person, place, and time.  Skin: Skin is warm and dry.  Psychiatric: She has a normal mood and affect.  Symmetric normal motor tone is noted throughout. Normal  muscle bulk. Muscle testing reveals 4-5/5 muscle strength of the upper extremity, and3- 5/5 of the lower extremity. (limited due to pain) limited range of motion in upper and lower extremities.  Decreased lumbar rom in all fields. Fine motor movements are normal in both hands.  Sensory is decreased to light touch, on the right.  DTR in the upper and lower extremity are present and symmetric 2+ except right patella 3+. No clonus is noted.  Patient arises from chair with difficulty. Wide based gait with decreased arm swing bilateral , able to stand on heels and toes . Tandem walk is possible but unstable. No pronator drift. Rhomberg negative. No tremor,  dystaxia or dysmetria noted. Antalgic on either leg.    Assessment: 1. MS-relaxing remitting  2. Low back pain  3. Right sided pain, neck, shoulder back and leg in a diffuse, non radicular pattern, most likely MS related  4. Decreased sensitivity on right side  5. MRI of the lumbar spine showed a mild grade 1 Spondylolisthesis of L4 on L5.  6. Depression/anxiety  Plan: 1.The diffuse symptoms of the patient, are most likely coming from her MS.  2. Continue to work on exercise and ROM as tolerate. I understand that she is struggling with pain however currently. Her mood is also playing a factor. 3. She will continue with the gabapentin for now. Will add trileptal for her neuropathic pain as it has a better tolerance profile.  4. Will schedule the baclofen TID for spasms.  5. Will increase her percocet up to three x per day to help with breakthrough pain. 6. Added lidoderm patches for low back pain, two per day. 6. Follow up with me in about a month. 30 minutes of face to face patient care time were spent during this visit. All questions were encouraged and answered.

## 2012-09-20 NOTE — Patient Instructions (Signed)
CONTINUE TO TRY TO EXERCISE AS ABLE   BRING IN YOUR LOW BACK MRI AT YOUR NEXT VISIT.   CALL ME WITH ANY PROBLEMS OR QUESTIONS (#[phone]).  HAVE A GOOD DAY

## 2012-10-15 ENCOUNTER — Encounter
Payer: BC Managed Care – PPO | Attending: Physical Medicine and Rehabilitation | Admitting: Physical Medicine & Rehabilitation

## 2012-10-15 ENCOUNTER — Encounter: Payer: Self-pay | Admitting: Physical Medicine & Rehabilitation

## 2012-10-15 VITALS — BP 105/68 | HR 76 | Resp 14 | Ht 62.0 in | Wt 292.0 lb

## 2012-10-15 DIAGNOSIS — M545 Low back pain, unspecified: Secondary | ICD-10-CM

## 2012-10-15 DIAGNOSIS — F418 Other specified anxiety disorders: Secondary | ICD-10-CM

## 2012-10-15 DIAGNOSIS — IMO0002 Reserved for concepts with insufficient information to code with codable children: Secondary | ICD-10-CM

## 2012-10-15 DIAGNOSIS — Z79899 Other long term (current) drug therapy: Secondary | ICD-10-CM

## 2012-10-15 DIAGNOSIS — Z5181 Encounter for therapeutic drug level monitoring: Secondary | ICD-10-CM

## 2012-10-15 DIAGNOSIS — M25559 Pain in unspecified hip: Secondary | ICD-10-CM

## 2012-10-15 DIAGNOSIS — M792 Neuralgia and neuritis, unspecified: Secondary | ICD-10-CM

## 2012-10-15 DIAGNOSIS — M62838 Other muscle spasm: Secondary | ICD-10-CM

## 2012-10-15 DIAGNOSIS — M25551 Pain in right hip: Secondary | ICD-10-CM

## 2012-10-15 DIAGNOSIS — F341 Dysthymic disorder: Secondary | ICD-10-CM

## 2012-10-15 DIAGNOSIS — G35D Multiple sclerosis, unspecified: Secondary | ICD-10-CM

## 2012-10-15 DIAGNOSIS — R269 Unspecified abnormalities of gait and mobility: Secondary | ICD-10-CM

## 2012-10-15 DIAGNOSIS — G35 Multiple sclerosis: Secondary | ICD-10-CM

## 2012-10-15 MED ORDER — OXYCODONE-ACETAMINOPHEN 5-325 MG PO TABS: 1.0000 | ORAL_TABLET | Freq: Three times a day (TID) | ORAL | Status: DC | PRN

## 2012-10-15 MED ORDER — LIDOCAINE 5 % EX OINT
TOPICAL_OINTMENT | CUTANEOUS | Status: DC | PRN
Start: 2012-10-15 — End: 2014-03-30

## 2012-10-15 NOTE — Patient Instructions (Signed)
CALL ME WITH ANY PROBLEMS OR QUESTIONS (#[phone]).  HAVE A GOOD DAY  

## 2012-10-15 NOTE — Progress Notes (Signed)
Subjective:    Patient ID: Mariah Morris, female    DOB: 24-Jan-1971, 42 y.o.   MRN: [id number]  HPI  [name] is back regarding her chronic pain. Overall she has been feeling better. Her sleep has improved as have her spasms. The trileptal helped the burning pain in her legs. The TID percocet was also quite helpful. She is able to do more around the house although her activity levels are still not what she wants them to be.   Her mood seems to have improved as well.     Pain Inventory Average Pain 6 Pain Right Now 6 My pain is constant, sharp, burning, tingling and aching  In the last 24 hours, has pain interfered with the following? General activity 2 Relation with others 2 Enjoyment of life 2 What TIME of day is your pain at its worst? evening, night Sleep (in general) Fair  Pain is worse with: walking, bending, sitting, standing and some activites Pain improves with: rest, heat/ice and medication Relief from Meds: 8  Mobility walk without assistance ability to climb steps?  yes do you drive?  yes transfers alone Do you have any goals in this area?  no  Function Do you have any goals in this area?  no  Neuro/Psych weakness trouble walking spasms confusion anxiety  Prior Studies Any changes since last visit?  no  Physicians involved in your care Any changes since last visit?  no   Family History  Problem Relation Age of Onset  . Hypertension Mother   . Diabetes Father   . Hypertension Father    History   Social History  . Marital Status: Unknown    Spouse Name: N/A    Number of Children: N/A  . Years of Education: N/A   Social History Main Topics  . Smoking status: Never Smoker   . Smokeless tobacco: Never Used  . Alcohol Use: Not on file  . Drug Use: Not on file  . Sexual Activity: Not on file   Other Topics Concern  . Not on file   Social History Narrative  . No narrative on file   Past Surgical History  Procedure Laterality Date   . Ovarian cyst removal    . Cesarean section      x2   Past Medical History  Diagnosis Date  . Neuromuscular disorder 2011    MS  . Hypertension   . GERD (gastroesophageal reflux disease)   . Gestational diabetes    LMP 09/01/2012    Review of Systems  Musculoskeletal: Positive for gait problem.  Neurological: Positive for weakness.       Spasms  Psychiatric/Behavioral: Positive for confusion. The patient is nervous/anxious.        Objective:   Physical Exam  Constitutional: She is oriented to person, place, and time. She appears well-developed and well-nourished.  Morbidly obese  HENT:  Head: Normocephalic.  Neck: Neck supple.  Musculoskeletal: She exhibits tenderness. In all limbs (no allodynia but hypersensitive), low back tender also. Neurological: She is alert and oriented to person, place, and time.  Skin: Skin is warm and dry.  Psychiatric: She has a normal mood and affect.  Symmetric normal motor tone is noted throughout. Normal muscle bulk. Muscle testing reveals 4-5/5 muscle strength of the upper extremity, and3+ to  5/5 of the lower extremity. (limited due to pain) limited range of motion in upper and lower extremities. Decreased lumbar rom in all fields but improved today in flexion.. Fine motor movements are  normal in both hands.  Sensory is decreased to light touch, on the right.  DTR in the upper and lower extremity are present and symmetric 2+ except right patella 3+. No clonus is noted.  Patient arises from chair with difficulty but more quickly. Wide based gait but is able to stand on heels and toes . Tandem walk is possible but unstable. No pronator drift. Rhomberg negative. No tremor, dystaxia or dysmetria noted. Antalgic on either leg.    Assessment:  1. MS-relaxing remitting  2. Low back pain  3. Right sided pain, neck, shoulder back and leg in a diffuse, non radicular pattern, most likely MS related  4. Decreased sensitivity on right side  5.  MRI of the lumbar spine showed a mild grade 1 Spondylolisthesis of L4 on L5.  6. Depression/anxiety   Plan:  1.The diffuse symptoms of the patient, are most likely coming from her MS.  2. Continue to work on exercise and ROM as tolerate. We discussed the need to integrate spiritually and socially. She understands.  3. She will continue with the gabapentin for now along with trileptal for her neuropathic pain as this combination has proven beneficial. 4. Continue baclofen TID for spasms---this has been effective.  5. Discussed staying with the three x daily percocet given tolerance, etc. Consider long acting agent.   6. Will try lidocaine gel in place of the lidoderm patches to see if they are covered. She may also try [facility] patches.  6. Follow up with me in about a month. 30 minutes of face to face patient care time were spent during this visit. All questions were encouraged and answered. She gave me her lumbar MRI on the way out which I will review prior to her next visit.

## 2012-11-04 ENCOUNTER — Encounter: Payer: Self-pay | Admitting: Physical Medicine & Rehabilitation

## 2012-11-11 ENCOUNTER — Ambulatory Visit: Payer: BC Managed Care – PPO | Admitting: Physical Medicine & Rehabilitation

## 2012-11-15 ENCOUNTER — Encounter
Payer: BC Managed Care – PPO | Attending: Physical Medicine and Rehabilitation | Admitting: Physical Medicine & Rehabilitation

## 2012-11-15 ENCOUNTER — Encounter: Payer: Self-pay | Admitting: Physical Medicine & Rehabilitation

## 2012-11-15 VITALS — BP 133/74 | HR 87 | Resp 14 | Ht 62.0 in | Wt 296.4 lb

## 2012-11-15 DIAGNOSIS — M545 Low back pain, unspecified: Secondary | ICD-10-CM | POA: Insufficient documentation

## 2012-11-15 DIAGNOSIS — G35 Multiple sclerosis: Secondary | ICD-10-CM | POA: Insufficient documentation

## 2012-11-15 DIAGNOSIS — IMO0002 Reserved for concepts with insufficient information to code with codable children: Secondary | ICD-10-CM

## 2012-11-15 DIAGNOSIS — M792 Neuralgia and neuritis, unspecified: Secondary | ICD-10-CM

## 2012-11-15 DIAGNOSIS — M62838 Other muscle spasm: Secondary | ICD-10-CM

## 2012-11-15 MED ORDER — OXYCODONE-ACETAMINOPHEN 7.5-325 MG PO TABS: 1.0000 | ORAL_TABLET | Freq: Three times a day (TID) | ORAL | Status: DC | PRN

## 2012-11-15 MED ORDER — OXCARBAZEPINE 600 MG PO TABS: 600.0000 mg | ORAL_TABLET | Freq: Two times a day (BID) | ORAL | Status: DC

## 2012-11-15 NOTE — Progress Notes (Signed)
Subjective:    Patient ID: Mariah Morris, female    DOB: [birth year], 42 y.o.   MRN: [id number]  HPI  Mariah Morris is back regarding her MS. She is having more problems with nerve pain in her legs especially. She is trying to lose weigt but is finding it hard. She is attempting exercise as she can tolerate it.   She is having difficulties with her rebif as it's causing diffuse muscle aches and flu like symptoms. She is also concerned that she won't be able to afford it because of insurance covg issues.   She contineus on her gabapentin and trileptal as rxed. She has found the baclofen helpful for spasms. She is taking the percocet for breakthrough pain.   She feels more stressed given the issues above, especially the financial ones.   Pain Inventory Average Pain 8 Pain Right Now 8 My pain is intermittent, stabbing, tingling and aching  In the last 24 hours, has pain interfered with the following? General activity 2 Relation with others 2 Enjoyment of life 2 What TIME of day is your pain at its worst? varies Sleep (in general) Fair  Pain is worse with: walking, bending, sitting, standing and some activites Pain improves with: rest and medication Relief from Meds: 7  Mobility walk without assistance  Function not employed: date last employed .  Neuro/Psych weakness trouble walking spasms confusion depression anxiety  Prior Studies Any changes since last visit?  no  Physicians involved in your care Any changes since last visit?  no   Family History  Problem Relation Age of Onset  . Hypertension Mother   . Diabetes Father   . Hypertension Father    History   Social History  . Marital Status: Unknown    Spouse Name: N/A    Number of Children: N/A  . Years of Education: N/A   Social History Main Topics  . Smoking status: Never Smoker   . Smokeless tobacco: Never Used  . Alcohol Use: None  . Drug Use: None  . Sexual Activity: None   Other Topics Concern   . None   Social History Narrative  . None   Past Surgical History  Procedure Laterality Date  . Ovarian cyst removal    . Cesarean section      x2   Past Medical History  Diagnosis Date  . Neuromuscular disorder 2011    MS  . Hypertension   . GERD (gastroesophageal reflux disease)   . Gestational diabetes    BP 133/74  Pulse 87  Resp 14  Ht 5\' 2"  (1.575 m)  Wt 296 lb 6.4 oz (134.446 kg)  BMI 54.2 kg/m2  SpO2 99%    Review of Systems  Musculoskeletal: Positive for back pain, gait problem and neck pain.       Spasm  Neurological: Positive for weakness.  Psychiatric/Behavioral: Positive for confusion and dysphoric mood. The patient is nervous/anxious.   All other systems reviewed and are negative.       Objective:   Physical Exam  Constitutional: She is oriented to person, place, and time. She appears well-developed and well-nourished.  Morbidly obese  HENT:  Head: Normocephalic.  Neck: Neck supple.  Musculoskeletal: She exhibits tenderness. In all limbs (no allodynia but hypersensitive), low back tender also. Neurological: She is alert and oriented to person, place, and time.  Skin: Skin is warm and dry.  Psychiatric: She has a normal mood and affect.  Symmetric normal motor tone is noted throughout. Normal muscle  bulk. Muscle testing reveals 4-5/5 muscle strength of the upper extremity, and 4- to 5/5 of the lower extremity. (proximally limited due to pain) limited range of motion in upper and lower extremities. Decreased lumbar rom in all fields but particularly  in flexion.. Fine motor movements are normal in both hands.  Sensory is decreased to light touch, on the right.  DTR in the upper and lower extremity are present and symmetric 2+ except right patella 3+. No clonus is noted.  Patient arises from chair with difficulty but more quickly. Wide based gait but is able to stand on heels and toes . Tandem walk is possible but unstable. No pronator drift.  Rhomberg negative. No tremor, dystaxia or dysmetria noted. Antalgic on either leg.    Assessment:  1. MS-relaxing remitting  2. Low back pain  3. Right sided pain, neck, shoulder back and leg in a diffuse, non radicular pattern, most likely MS related  4. Decreased sensitivity on right side  5. MRI of the lumbar spine showed a mild grade 1 Spondylolisthesis of L4 on L5.  6. Depression/anxiety   Plan:  1.The diffuse symptoms of the patient, are most likely coming from her MS.  2. Certainly her back pain could be related to #5. Asked the patient to forward me a copy of her MRI as I only received MRI's of the head anc c-spine before. We could consider an ESI at this level perhaps but I told her this would only help her axial pain..  3. She will continue with the gabapentin. Increase trileptal to 600mg  bid 4. Continue baclofen TID for spasms--  5. Increase percocet to 7.5 q8 prn.  6. Work on posture, weight loss, and exercise as possible.   6. Follow up with me in about a month. 30 minutes of face to face patient care time were spent during this visit. All questions were encouraged and answered.

## 2012-11-15 NOTE — Patient Instructions (Signed)
BRING ME THE HARD COPY OF YOUR LUMBAR MRI   TALK WITH YOUR NEUROLOGIST ABOUT A CHANGE IN YOUR MS MEDICATION ($$$COST, SIDE EFFECTS)

## 2012-12-10 ENCOUNTER — Encounter: Payer: Self-pay | Attending: Physical Medicine and Rehabilitation | Admitting: Physical Medicine & Rehabilitation

## 2012-12-10 ENCOUNTER — Encounter: Payer: Self-pay | Admitting: Physical Medicine & Rehabilitation

## 2012-12-10 ENCOUNTER — Telehealth: Payer: Self-pay | Admitting: *Deleted

## 2012-12-10 VITALS — BP 165/83 | HR 93 | Resp 14 | Ht 62.0 in | Wt 290.0 lb

## 2012-12-10 DIAGNOSIS — G35 Multiple sclerosis: Secondary | ICD-10-CM | POA: Insufficient documentation

## 2012-12-10 DIAGNOSIS — M4310 Spondylolisthesis, site unspecified: Secondary | ICD-10-CM

## 2012-12-10 DIAGNOSIS — F341 Dysthymic disorder: Secondary | ICD-10-CM

## 2012-12-10 DIAGNOSIS — M545 Low back pain, unspecified: Secondary | ICD-10-CM

## 2012-12-10 DIAGNOSIS — M62838 Other muscle spasm: Secondary | ICD-10-CM

## 2012-12-10 DIAGNOSIS — R269 Unspecified abnormalities of gait and mobility: Secondary | ICD-10-CM

## 2012-12-10 DIAGNOSIS — IMO0002 Reserved for concepts with insufficient information to code with codable children: Secondary | ICD-10-CM

## 2012-12-10 DIAGNOSIS — R2689 Other abnormalities of gait and mobility: Secondary | ICD-10-CM

## 2012-12-10 DIAGNOSIS — F418 Other specified anxiety disorders: Secondary | ICD-10-CM

## 2012-12-10 DIAGNOSIS — G35D Multiple sclerosis, unspecified: Secondary | ICD-10-CM

## 2012-12-10 DIAGNOSIS — M792 Neuralgia and neuritis, unspecified: Secondary | ICD-10-CM

## 2012-12-10 DIAGNOSIS — M4316 Spondylolisthesis, lumbar region: Secondary | ICD-10-CM | POA: Insufficient documentation

## 2012-12-10 MED ORDER — PREDNISONE 20 MG PO TABS: 20.0000 mg | ORAL_TABLET | Freq: Every day | ORAL | Status: DC

## 2012-12-10 MED ORDER — TIZANIDINE HCL 2 MG PO CAPS: 2.0000 mg | ORAL_CAPSULE | Freq: Three times a day (TID) | ORAL | Status: DC | PRN

## 2012-12-10 MED ORDER — OXYCODONE-ACETAMINOPHEN 7.5-325 MG PO TABS: 1.0000 | ORAL_TABLET | Freq: Three times a day (TID) | ORAL | Status: DC | PRN

## 2012-12-10 NOTE — Progress Notes (Signed)
Subjective:    Patient ID: Mariah Morris, female    DOB: 1970-03-06, 42 y.o.   MRN: [id number]  HPI  Mrs. Zilberman is back regarding her chronic pain. She states that her leg pain has been better but her back has become increasingly painful. She thinks that it may be from overdoing it with exercise. Standing hurts if she does it for prolonged periods. Sitting also hurts as well. She doesn't recall a new injury or fall.   The trileptal and gabapentin seem to be helping her legs. She continues on the percocet for breakthrough pain but it hasn't been helping much of late.     Pain Inventory Average Pain 9 Pain Right Now 9 My pain is intermittent, sharp, stabbing and aching  In the last 24 hours, has pain interfered with the following? General activity 4 Relation with others 5 Enjoyment of life 5 What TIME of day is your pain at its worst? day, evening, night Sleep (in general) Fair  Pain is worse with: walking, bending, sitting and standing Pain improves with: rest, heat/ice and medication Relief from Meds: 8  Mobility walk without assistance Do you have any goals in this area?  no  Function not employed: date last employed na Do you have any goals in this area?  no  Neuro/Psych weakness trouble walking spasms confusion depression anxiety  Prior Studies Any changes since last visit?  no  Physicians involved in your care Any changes since last visit?  no   Family History  Problem Relation Age of Onset  . Hypertension Mother   . Diabetes Father   . Hypertension Father    History   Social History  . Marital Status: Unknown    Spouse Name: N/A    Number of Children: N/A  . Years of Education: N/A   Social History Main Topics  . Smoking status: Never Smoker   . Smokeless tobacco: Never Used  . Alcohol Use: Not on file  . Drug Use: Not on file  . Sexual Activity: Not on file   Other Topics Concern  . Not on file   Social History Narrative  . No  narrative on file   Past Surgical History  Procedure Laterality Date  . Ovarian cyst removal    . Cesarean section      x2   Past Medical History  Diagnosis Date  . Neuromuscular disorder 2011    MS  . Hypertension   . GERD (gastroesophageal reflux disease)   . Gestational diabetes    There were no vitals taken for this visit.     Review of Systems  Musculoskeletal: Positive for back pain, gait problem and neck pain.  Neurological: Positive for weakness.       Spasms  Psychiatric/Behavioral: Positive for confusion and dysphoric mood. The patient is nervous/anxious.        Objective:   Physical Exam  Constitutional: She is oriented to person, place, and time. She appears well-developed and well-nourished. IN distress Morbidly obese  HENT:  Head: Normocephalic.  Neck: Neck supple.  Musculoskeletal: She exhibits tenderness. In all limbs (no allodynia but hypersensitive), low back tender also. Neurological: She is alert and oriented to person, place, and time.  Skin: Skin is warm and dry.  Psychiatric: She has a normal mood and affect.  Symmetric normal motor tone is noted throughout. Normal muscle bulk. Muscle testing reveals 4-5/5 muscle strength of the upper extremity, and 4- to 5/5 of the lower extremity. (proximally limited due to  pain) limited range of motion in upper and lower extremities. Decreased lumbar rom in all fields but particularly in flexion.. Fine motor movements are normal in both hands.  Sensory is decreased to light touch, on the right.  DTR in the upper and lower extremity are present and symmetric 2+ except right patella 3+. No clonus is noted.  Patient has difficutly standing upright. Tender to palpation throughout the lower lumbar spine. More severe with extension than flexion. Notable muscle spasms noted in paraspinals. . . Wide based gait but is able to stand on heels and toes . Tandem walk is possible but unstable. No pronator drift. Rhomberg  negative. No tremor, dystaxia or dysmetria noted. Antalgic on either leg.    Assessment:  1. MS-relaxing remitting  2. Low back pain, with documented Grade 1 spondylolisthesis of L4 on L5- pain is increasing 3. Right sided pain, neck, shoulder back and leg in a diffuse, non radicular pattern, most likely MS related  4. Decreased sensitivity on right side   5. Depression/anxiety    Plan:  1.The diffuse symptoms of the patient, are most likely coming from her MS. ---her  Legs and arms are invfact doing better today.  2. The patient will go to [facility] for MRI. Need to see this before we pursue ESI's. 3. She will continue with the gabapentin. Increased trileptal to 600mg  bid which seems to have helped 4. Continue baclofen TID for spasms-- will add zanaflex for her low back 5. Increase percocet to 7.5 q8 prn.  6 Predisone taper was written today to help with acute pain.  6. Follow up with me pending above. 30 minutes of face to face patient care time were spent during this visit. All questions were encouraged and answered.

## 2012-12-10 NOTE — Telephone Encounter (Signed)
Mariah Morris is having a  Problem with [employer].  They have her insurance on hold until she can pay a balance owed and therefore cannot afford to come to an appt at this time. She is wondering if she can pick up an rx this month.  She was last seen 11/15/12. She has an appt today at 12:20

## 2012-12-10 NOTE — Patient Instructions (Signed)
I NEED YOUR MRI STUDY FROM [place] SO WE CAN DECIDE UPON POTENTIAL INJECTIONS

## 2012-12-11 ENCOUNTER — Telehealth: Payer: Self-pay

## 2012-12-11 NOTE — Telephone Encounter (Signed)
Left a voicemail for patient to return call to clinic.

## 2012-12-11 NOTE — Telephone Encounter (Signed)
Patient called and said that she took her RX to the pharmacy yesterday and they told her that her insurance had lapsed, so she had to take the RX to her PCP pharmacy. Patient would like for someone to call her back.

## 2012-12-12 NOTE — Telephone Encounter (Signed)
First of all,she was given percocet 7.5.  Second, could she not get it from another pharmacy for this rx?

## 2012-12-12 NOTE — Telephone Encounter (Signed)
Patient returned call and stated that her PCP Pharmacy has to order her Hydrocodone and it will take about a week to come in. Patient want to know if she can get a "stronger medication that the pharmacy has in stock to last for the week". I told the patient that we typically do not prescribe a stronger medication because the pharmacy is out of stock. Please advise.

## 2012-12-12 NOTE — Telephone Encounter (Signed)
Patient said "that others pharmacies cost to much and the pharmacy at her PCP was based on her income".

## 2012-12-31 ENCOUNTER — Telehealth: Payer: Self-pay

## 2012-12-31 NOTE — Telephone Encounter (Signed)
Patient called and stated she was in the process of changing insurance companies. She does not know if her new insurance will be active by her appt on 12/9. If not she is wanting to know if she can still get her prescription of Oxycodone for this month without an appt?

## 2013-01-01 ENCOUNTER — Other Ambulatory Visit: Payer: Self-pay | Admitting: *Deleted

## 2013-01-01 DIAGNOSIS — M792 Neuralgia and neuritis, unspecified: Secondary | ICD-10-CM

## 2013-01-01 DIAGNOSIS — G35 Multiple sclerosis: Secondary | ICD-10-CM

## 2013-01-01 DIAGNOSIS — M62838 Other muscle spasm: Secondary | ICD-10-CM

## 2013-01-01 DIAGNOSIS — M545 Low back pain: Secondary | ICD-10-CM

## 2013-01-01 MED ORDER — OXYCODONE-ACETAMINOPHEN 7.5-325 MG PO TABS: 1.0000 | ORAL_TABLET | Freq: Three times a day (TID) | ORAL | Status: DC | PRN

## 2013-01-01 NOTE — Telephone Encounter (Signed)
rx printed early for controlled medication for the visit with RN on 01/07/13 (to be signed by MD) 

## 2013-01-01 NOTE — Telephone Encounter (Signed)
Left patient a voicemail informing her that RX for Oxycodone will be ready for pickup on 01/02/13. Dr. Riley Kill is only willing to write prescription without Patient being seen this one time. Patient must make an appt for January.

## 2013-01-01 NOTE — Telephone Encounter (Signed)
Hillary please remove Aleia from RN visit schedule on 01/07/13  She will need to be rescheduled for next month.

## 2013-01-01 NOTE — Telephone Encounter (Signed)
I am willing to do this one time only

## 2013-01-07 ENCOUNTER — Ambulatory Visit: Payer: BC Managed Care – PPO

## 2013-02-14 ENCOUNTER — Encounter
Payer: No Typology Code available for payment source | Attending: Physical Medicine and Rehabilitation | Admitting: Physical Medicine & Rehabilitation

## 2013-02-14 ENCOUNTER — Encounter: Payer: Self-pay | Admitting: Physical Medicine & Rehabilitation

## 2013-02-14 VITALS — BP 126/68 | HR 82 | Resp 14 | Ht 62.0 in | Wt 292.0 lb

## 2013-02-14 DIAGNOSIS — M545 Low back pain, unspecified: Secondary | ICD-10-CM | POA: Insufficient documentation

## 2013-02-14 DIAGNOSIS — IMO0002 Reserved for concepts with insufficient information to code with codable children: Secondary | ICD-10-CM | POA: Insufficient documentation

## 2013-02-14 DIAGNOSIS — G35 Multiple sclerosis: Secondary | ICD-10-CM | POA: Insufficient documentation

## 2013-02-14 DIAGNOSIS — F418 Other specified anxiety disorders: Secondary | ICD-10-CM

## 2013-02-14 DIAGNOSIS — M62838 Other muscle spasm: Secondary | ICD-10-CM | POA: Insufficient documentation

## 2013-02-14 DIAGNOSIS — M4316 Spondylolisthesis, lumbar region: Secondary | ICD-10-CM

## 2013-02-14 DIAGNOSIS — R2689 Other abnormalities of gait and mobility: Secondary | ICD-10-CM

## 2013-02-14 DIAGNOSIS — R269 Unspecified abnormalities of gait and mobility: Secondary | ICD-10-CM | POA: Insufficient documentation

## 2013-02-14 DIAGNOSIS — M4310 Spondylolisthesis, site unspecified: Secondary | ICD-10-CM | POA: Insufficient documentation

## 2013-02-14 DIAGNOSIS — F341 Dysthymic disorder: Secondary | ICD-10-CM | POA: Insufficient documentation

## 2013-02-14 MED ORDER — OXYCODONE-ACETAMINOPHEN 7.5-325 MG PO TABS: 1.0000 | ORAL_TABLET | Freq: Four times a day (QID) | ORAL | Status: DC | PRN

## 2013-02-14 NOTE — Progress Notes (Signed)
Subjective:    Patient ID: Mariah Morris, female    DOB: Jun 19, 1970, 43 y.o.   MRN: [id number]  HPI  Mrs. Gustafson is back regarding her chronic pain and MS. She has been trying to exercise daily and has seen a lot of the positive effects in respect to her pain. She also had improvements in her energy level. She seems to enjoy working out. She hasn't see a change in her weight although she has made improvements in her diet.   Tizanidine has not been filled yet due to coverage. She is taking a few more percocet given her increased activity.    Pain Inventory Average Pain 7 Pain Right Now 7 My pain is constant, burning, dull, stabbing and aching  In the last 24 hours, has pain interfered with the following? General activity 5 Relation with others 5 Enjoyment of life 5 What TIME of day is your pain at its worst? evening and night Sleep (in general) Fair  Pain is worse with: walking, bending, sitting and some activites Pain improves with: therapy/exercise and medication Relief from Meds: 7  Mobility Do you have any goals in this area?  no  Function Do you have any goals in this area?  no  Neuro/Psych weakness numbness tingling trouble walking spasms confusion anxiety  Prior Studies Any changes since last visit?  no  Physicians involved in your care Any changes since last visit?  no   Family History  Problem Relation Age of Onset  . Hypertension Mother   . Diabetes Father   . Hypertension Father    History   Social History  . Marital Status: Unknown    Spouse Name: N/A    Number of Children: N/A  . Years of Education: N/A   Social History Main Topics  . Smoking status: Never Smoker   . Smokeless tobacco: Never Used  . Alcohol Use: None  . Drug Use: None  . Sexual Activity: None   Other Topics Concern  . None   Social History Narrative  . None   Past Surgical History  Procedure Laterality Date  . Ovarian cyst removal    . Cesarean section        x2   Past Medical History  Diagnosis Date  . Neuromuscular disorder 2011    MS  . Hypertension   . GERD (gastroesophageal reflux disease)   . Gestational diabetes    BP 126/68  Pulse 82  Resp 14  Ht 5\' 2"  (1.575 m)  Wt 292 lb (132.45 kg)  BMI 53.39 kg/m2  SpO2 98%   Review of Systems  Musculoskeletal: Positive for gait problem.       Spasms  Neurological: Positive for weakness and numbness.       Tingling  Psychiatric/Behavioral: Positive for confusion. The patient is nervous/anxious.   All other systems reviewed and are negative.       Objective:   Physical Exam  Constitutional: She is oriented to person, place, and time. She appears well-developed and well-nourished. IN distress Morbidly obese  HENT:  Head: Normocephalic.  Neck: Neck supple.  Musculoskeletal: She exhibits tenderness. In all limbs (no allodynia but hypersensitive), low back tender also. Neurological: She is alert and oriented to person, place, and time.  Skin: Skin is warm and dry.  Psychiatric: She has a normal mood and affect.  Symmetric normal motor tone is noted throughout. Normal muscle bulk. Muscle testing reveals 4-5/5 muscle strength of the upper extremity, and 4- to 5/5 of  the lower extremity. (proximally limited due to pain) limited range of motion in upper and lower extremities.   Sensory is decreased to light touch, on the right.  DTR in the upper and lower extremity are present and symmetric 2+ except right patella 3+. No clonus is noted.  [name] is moving much better today. She stands easily from a seated position. She flexed to 90 degrees almost today. Gait was improved and more balanced. Low back was less tender to palpation. Neutral standing posture was improved and more erect.     Assessment:  1. MS-relaxing remitting  2. Low back pain, with documented Grade 1 spondylolisthesis of L4 on L5- pain is increasing  3. Right sided pain, neck, shoulder back and leg in a diffuse,  non radicular pattern, most likely MS related  4. Decreased sensitivity on right side  5. Depression/anxiety    Plan:  1. I am very pleased with her motivation and exercising. Her insight is improved as well. She seems to have bought into "self-help" and has seen the benefits of her efforts. We discussed improving her diet to go along with her exercising.  2. Given that her pain is improving and she hasn't had the MRI, will hold off on rescheduling it at this time.  3. She will continue with the gabapentin. Increased trileptal to 600mg  bid which seems to have helped  4. Continue baclofen TID for spasms-- she will try zanaflex for her low back now that she can get it filled. 5. Percocet to 7.5 q8 prn.  Will rx #100 for now 6. Follow up with me pending above. 30 minutes of face to face patient care time were spent during this visit. All questions were encouraged and answered.

## 2013-02-14 NOTE — Patient Instructions (Signed)
CONTINUE WITH YOUR EXERCISING. YOU ARE DOING A GREAT JOB!!!!

## 2013-03-17 ENCOUNTER — Encounter
Payer: No Typology Code available for payment source | Attending: Physical Medicine & Rehabilitation | Admitting: *Deleted

## 2013-03-17 ENCOUNTER — Other Ambulatory Visit: Payer: Self-pay | Admitting: *Deleted

## 2013-03-17 ENCOUNTER — Encounter: Payer: Self-pay | Admitting: *Deleted

## 2013-03-17 VITALS — BP 142/76 | HR 76 | Resp 14 | Ht 62.0 in | Wt 291.0 lb

## 2013-03-17 DIAGNOSIS — K219 Gastro-esophageal reflux disease without esophagitis: Secondary | ICD-10-CM | POA: Insufficient documentation

## 2013-03-17 DIAGNOSIS — R2689 Other abnormalities of gait and mobility: Secondary | ICD-10-CM

## 2013-03-17 DIAGNOSIS — F329 Major depressive disorder, single episode, unspecified: Secondary | ICD-10-CM | POA: Insufficient documentation

## 2013-03-17 DIAGNOSIS — F411 Generalized anxiety disorder: Secondary | ICD-10-CM | POA: Insufficient documentation

## 2013-03-17 DIAGNOSIS — M62838 Other muscle spasm: Secondary | ICD-10-CM

## 2013-03-17 DIAGNOSIS — M549 Dorsalgia, unspecified: Secondary | ICD-10-CM | POA: Insufficient documentation

## 2013-03-17 DIAGNOSIS — R209 Unspecified disturbances of skin sensation: Secondary | ICD-10-CM | POA: Insufficient documentation

## 2013-03-17 DIAGNOSIS — I10 Essential (primary) hypertension: Secondary | ICD-10-CM | POA: Insufficient documentation

## 2013-03-17 DIAGNOSIS — F3289 Other specified depressive episodes: Secondary | ICD-10-CM | POA: Insufficient documentation

## 2013-03-17 DIAGNOSIS — M542 Cervicalgia: Secondary | ICD-10-CM | POA: Insufficient documentation

## 2013-03-17 DIAGNOSIS — G35 Multiple sclerosis: Secondary | ICD-10-CM

## 2013-03-17 DIAGNOSIS — M79609 Pain in unspecified limb: Secondary | ICD-10-CM | POA: Insufficient documentation

## 2013-03-17 DIAGNOSIS — M545 Low back pain, unspecified: Secondary | ICD-10-CM | POA: Insufficient documentation

## 2013-03-17 DIAGNOSIS — Q762 Congenital spondylolisthesis: Secondary | ICD-10-CM | POA: Insufficient documentation

## 2013-03-17 DIAGNOSIS — M792 Neuralgia and neuritis, unspecified: Secondary | ICD-10-CM

## 2013-03-17 DIAGNOSIS — M25519 Pain in unspecified shoulder: Secondary | ICD-10-CM | POA: Insufficient documentation

## 2013-03-17 DIAGNOSIS — M79604 Pain in right leg: Secondary | ICD-10-CM

## 2013-03-17 MED ORDER — BACLOFEN 10 MG PO TABS: 10.0000 mg | ORAL_TABLET | Freq: Three times a day (TID) | ORAL | Status: DC

## 2013-03-17 MED ORDER — GABAPENTIN 600 MG PO TABS: 600.0000 mg | ORAL_TABLET | Freq: Two times a day (BID) | ORAL | Status: DC

## 2013-03-17 MED ORDER — OXYCODONE-ACETAMINOPHEN 7.5-325 MG PO TABS: 1.0000 | ORAL_TABLET | Freq: Four times a day (QID) | ORAL | Status: DC | PRN

## 2013-03-17 MED ORDER — TIZANIDINE HCL 2 MG PO CAPS: 2.0000 mg | ORAL_CAPSULE | Freq: Three times a day (TID) | ORAL | Status: DC | PRN

## 2013-03-17 MED ORDER — OXCARBAZEPINE 600 MG PO TABS: 600.0000 mg | ORAL_TABLET | Freq: Two times a day (BID) | ORAL | Status: DC

## 2013-03-17 NOTE — Progress Notes (Signed)
Here for pill count and medication refills. Percocet 7.5/325 # 100 Fill date 02/22/13    Today NV#  2.5 tab  She understands she will not be able to fill prescription until 03/23/13.  VSS    Pain level: 7  No falls .  Educated and handout for home fall prevention tips given.  Return on one month for med refills.

## 2013-03-17 NOTE — Patient Instructions (Signed)
Return on one month for RN med refill

## 2013-03-17 NOTE — Telephone Encounter (Signed)
RX printed early for controlled medication for the visit with RN on 03/17/13 (to be signed by MD) 

## 2013-04-04 ENCOUNTER — Other Ambulatory Visit: Payer: Self-pay | Admitting: *Deleted

## 2013-04-04 MED ORDER — OXYCODONE-ACETAMINOPHEN 7.5-325 MG PO TABS: 1.0000 | ORAL_TABLET | Freq: Four times a day (QID) | ORAL | Status: DC | PRN

## 2013-04-04 NOTE — Telephone Encounter (Signed)
RX printed for MD to sign for RN visit 04/07/13 

## 2013-04-07 ENCOUNTER — Encounter
Payer: No Typology Code available for payment source | Attending: Physical Medicine & Rehabilitation | Admitting: *Deleted

## 2013-04-07 ENCOUNTER — Encounter: Payer: Self-pay | Admitting: *Deleted

## 2013-04-07 VITALS — BP 128/78 | HR 71 | Resp 14

## 2013-04-07 DIAGNOSIS — K219 Gastro-esophageal reflux disease without esophagitis: Secondary | ICD-10-CM | POA: Insufficient documentation

## 2013-04-07 DIAGNOSIS — I10 Essential (primary) hypertension: Secondary | ICD-10-CM | POA: Insufficient documentation

## 2013-04-07 DIAGNOSIS — F3289 Other specified depressive episodes: Secondary | ICD-10-CM | POA: Insufficient documentation

## 2013-04-07 DIAGNOSIS — M79609 Pain in unspecified limb: Secondary | ICD-10-CM | POA: Insufficient documentation

## 2013-04-07 DIAGNOSIS — G35 Multiple sclerosis: Secondary | ICD-10-CM | POA: Insufficient documentation

## 2013-04-07 DIAGNOSIS — M545 Low back pain, unspecified: Secondary | ICD-10-CM | POA: Insufficient documentation

## 2013-04-07 DIAGNOSIS — M79604 Pain in right leg: Secondary | ICD-10-CM

## 2013-04-07 DIAGNOSIS — F329 Major depressive disorder, single episode, unspecified: Secondary | ICD-10-CM | POA: Insufficient documentation

## 2013-04-07 DIAGNOSIS — F411 Generalized anxiety disorder: Secondary | ICD-10-CM | POA: Insufficient documentation

## 2013-04-07 DIAGNOSIS — M25519 Pain in unspecified shoulder: Secondary | ICD-10-CM | POA: Insufficient documentation

## 2013-04-07 DIAGNOSIS — M542 Cervicalgia: Secondary | ICD-10-CM | POA: Insufficient documentation

## 2013-04-07 DIAGNOSIS — M4316 Spondylolisthesis, lumbar region: Secondary | ICD-10-CM

## 2013-04-07 NOTE — Progress Notes (Signed)
Here for pill count and medication refills. Oxycodone 7.5/325 # 100  Fill date 03/17/13    Today NV# 13.5  VSS  She has had no falls and was educated and given handout on fall prevention last visit.  She is a low fall risk.  She will return in one month to see Dr Riley Kill.  She has been trying to lose weight but is not making much progress. Now that the weather is warming it will be easier to get out and try to walk.  Pill count appropriate and refill was given.   She said her MS medication she injects has given her a rash and itching .  She will discuss with her neurologist at upcoming visit.

## 2013-04-07 NOTE — Patient Instructions (Signed)
Follow up with Riley Kill 05/14/13

## 2013-05-07 ENCOUNTER — Encounter: Payer: Self-pay | Admitting: Physical Medicine & Rehabilitation

## 2013-05-07 ENCOUNTER — Encounter: Payer: Medicare Other | Attending: Physical Medicine & Rehabilitation | Admitting: Physical Medicine & Rehabilitation

## 2013-05-07 VITALS — BP 137/80 | HR 84 | Resp 14 | Ht 62.0 in | Wt 292.0 lb

## 2013-05-07 DIAGNOSIS — IMO0002 Reserved for concepts with insufficient information to code with codable children: Secondary | ICD-10-CM | POA: Diagnosis not present

## 2013-05-07 DIAGNOSIS — M4316 Spondylolisthesis, lumbar region: Secondary | ICD-10-CM

## 2013-05-07 DIAGNOSIS — Q762 Congenital spondylolisthesis: Secondary | ICD-10-CM | POA: Insufficient documentation

## 2013-05-07 DIAGNOSIS — M79609 Pain in unspecified limb: Secondary | ICD-10-CM | POA: Insufficient documentation

## 2013-05-07 DIAGNOSIS — G35 Multiple sclerosis: Secondary | ICD-10-CM | POA: Insufficient documentation

## 2013-05-07 DIAGNOSIS — I10 Essential (primary) hypertension: Secondary | ICD-10-CM | POA: Insufficient documentation

## 2013-05-07 DIAGNOSIS — M62838 Other muscle spasm: Secondary | ICD-10-CM

## 2013-05-07 DIAGNOSIS — F329 Major depressive disorder, single episode, unspecified: Secondary | ICD-10-CM | POA: Insufficient documentation

## 2013-05-07 DIAGNOSIS — R209 Unspecified disturbances of skin sensation: Secondary | ICD-10-CM | POA: Diagnosis not present

## 2013-05-07 DIAGNOSIS — G8929 Other chronic pain: Secondary | ICD-10-CM | POA: Diagnosis not present

## 2013-05-07 DIAGNOSIS — M792 Neuralgia and neuritis, unspecified: Secondary | ICD-10-CM

## 2013-05-07 DIAGNOSIS — F418 Other specified anxiety disorders: Secondary | ICD-10-CM

## 2013-05-07 DIAGNOSIS — M545 Low back pain, unspecified: Secondary | ICD-10-CM

## 2013-05-07 DIAGNOSIS — M4310 Spondylolisthesis, site unspecified: Secondary | ICD-10-CM

## 2013-05-07 DIAGNOSIS — F411 Generalized anxiety disorder: Secondary | ICD-10-CM | POA: Insufficient documentation

## 2013-05-07 DIAGNOSIS — F341 Dysthymic disorder: Secondary | ICD-10-CM | POA: Diagnosis not present

## 2013-05-07 DIAGNOSIS — K219 Gastro-esophageal reflux disease without esophagitis: Secondary | ICD-10-CM | POA: Diagnosis not present

## 2013-05-07 DIAGNOSIS — F3289 Other specified depressive episodes: Secondary | ICD-10-CM | POA: Diagnosis not present

## 2013-05-07 MED ORDER — OXYCODONE-ACETAMINOPHEN 7.5-325 MG PO TABS: 1.0000 | ORAL_TABLET | Freq: Four times a day (QID) | ORAL | Status: DC | PRN

## 2013-05-07 MED ORDER — GABAPENTIN 600 MG PO TABS: 600.0000 mg | ORAL_TABLET | Freq: Three times a day (TID) | ORAL | Status: DC

## 2013-05-07 NOTE — Progress Notes (Signed)
Subjective:    Patient ID: Mariah Morris, female    DOB: 11/19/1970, 43 y.o.   MRN: [id number]  HPI  Mariah Morris is back regarding Mariah Morris chronic pain. Mariah Morris is trying to work on Mariah Morris weight but is still struggling to lose it. Mariah Morris is doing more walking and stretching exercises at home. Mariah Morris has given up soft drinks and eating more fruit, drinking more water.  The trileptal and zanaflex have been helpful for Mariah Morris pain and spasms in addition to what Mariah Morris was taking before. Percocet is effective as well. Mariah Morris still has more pain in Mariah Morris legs at night. Mariah Morris finds it difficult to get settled in enough to rest.   There have been no changes to er MS regimen  Pain Inventory Average Pain 7 Pain Right Now 7 My pain is constant, sharp, burning, tingling and aching  In the last 24 hours, has pain interfered with the following? General activity 7 Relation with others 7 Enjoyment of life 6 What TIME of day is your pain at its worst? evening, night Sleep (in general) Fair  Pain is worse with: walking, bending, sitting, standing and some activites Pain improves with: rest, heat/ice, therapy/exercise and medication Relief from Meds: 7  Mobility walk without assistance Do you have any goals in this area?  no  Function Do you have any goals in this area?  no  Neuro/Psych numbness tingling trouble walking spasms depression anxiety  Prior Studies Any changes since last visit?  no  Physicians involved in your care Any changes since last visit?  no   Family History  Problem Relation Age of Onset  . Hypertension Mother   . Diabetes Father   . Hypertension Father    History   Social History  . Marital Status: Unknown    Spouse Name: N/A    Number of Children: N/A  . Years of Education: N/A   Social History Main Topics  . Smoking status: Never Smoker   . Smokeless tobacco: Never Used  . Alcohol Use: None  . Drug Use: None  . Sexual Activity: None   Other Topics Concern  . None    Social History Narrative  . None   Past Surgical History  Procedure Laterality Date  . Ovarian cyst removal    . Cesarean section      x2   Past Medical History  Diagnosis Date  . Neuromuscular disorder 2011    MS  . Hypertension   . GERD (gastroesophageal reflux disease)   . Gestational diabetes    BP 137/80  Pulse 84  Resp 14  Ht 5\' 2"  (1.575 m)  Wt 292 lb (132.45 kg)  BMI 53.39 kg/m2  SpO2 100%  Opioid Risk Score:   Fall Risk Score: Low Fall Risk (0-5 points) (pt educated and given a brochure on fall risk)    Review of Systems  Musculoskeletal: Positive for back pain and gait problem.  Neurological: Positive for numbness.       Tingling, spasms  Psychiatric/Behavioral: The patient is nervous/anxious.        Depression  All other systems reviewed and are negative.      Objective:   Physical Exam  Constitutional: Mariah Morris is oriented to person, place, and time. Mariah Morris appears well-developed and well-nourished. IN distress Morbidly obese  HENT:  Head: Normocephalic.  Neck: Neck supple.  Musculoskeletal: Mariah Morris exhibits tenderness. In all limbs (no allodynia but hypersensitive), low back tender also. Neurological: Mariah Morris is alert and oriented to person, place, and  time.  Skin: Skin is warm and dry.  Psychiatric: Mariah Morris has a normal mood and affect.  Symmetric normal motor tone is noted throughout. Normal muscle bulk. Muscle testing reveals 4-5/5 muscle strength of the upper extremity, and 4- to 5/5 of the lower extremity. (proximally limited due to pain) limited range of motion in upper and lower extremities.  Sensory is decreased to light touch, on the right.  DTR in the upper and lower extremity are present and symmetric 2+ except right patella 3+. No clonus is noted.  [name] is moving much better today. Mariah Morris stands easily from a seated position. Mariah Morris flexed to 90 degrees almost today. Gait was improved and more balanced. Low back was less tender to palpation. Neutral  standing posture was improved and more erect.    Assessment:  1. MS-relaxing remitting  2. Low back pain, with documented Grade 1 spondylolisthesis of L4 on L5- 3. Right sided pain, neck, shoulder back and leg in a diffuse, non radicular pattern, most likely MS related  4. Decreased sensitivity on right side  5. Depression/anxiety  6. Morbid obesity   Plan:  1. Continue with diet and exercise measures.Mariah Morris is on the right track 2. For leg symptoms, Increase gabapentin to 600mg  TID (2 doses in the evening --dinner and bedtime--- to help with sleep) 3.  Maintain trileptal at 600mg  bid   4. Continue baclofen TID for spasms along with zanaflex (Mariah Morris may try 4mg  at night) 5. Percocet to 7.5 q8 prn.  #100 6. Follow up with NP in  About a month.. 30 minutes of face to face patient care time were spent during this visit. All questions were encouraged and answered.

## 2013-05-07 NOTE — Patient Instructions (Signed)
WORK ON RELAXATION TECHNIQUES AT NIGHT TO HELP YOU FALL ASLEEP.   FIND OTHER ACTIVITIES FOR FUN OR LEISURE

## 2013-05-14 ENCOUNTER — Ambulatory Visit: Payer: BC Managed Care – PPO | Admitting: Physical Medicine & Rehabilitation

## 2013-06-09 ENCOUNTER — Encounter: Payer: Self-pay | Admitting: Registered Nurse

## 2013-06-09 ENCOUNTER — Encounter: Payer: Medicare Other | Attending: Physical Medicine & Rehabilitation | Admitting: Registered Nurse

## 2013-06-09 VITALS — BP 121/74 | HR 88 | Resp 14 | Ht 62.0 in | Wt 294.0 lb

## 2013-06-09 DIAGNOSIS — M4310 Spondylolisthesis, site unspecified: Secondary | ICD-10-CM | POA: Insufficient documentation

## 2013-06-09 DIAGNOSIS — F341 Dysthymic disorder: Secondary | ICD-10-CM

## 2013-06-09 DIAGNOSIS — M545 Low back pain, unspecified: Secondary | ICD-10-CM

## 2013-06-09 DIAGNOSIS — IMO0002 Reserved for concepts with insufficient information to code with codable children: Secondary | ICD-10-CM | POA: Diagnosis not present

## 2013-06-09 DIAGNOSIS — G35 Multiple sclerosis: Secondary | ICD-10-CM | POA: Diagnosis not present

## 2013-06-09 DIAGNOSIS — R2689 Other abnormalities of gait and mobility: Secondary | ICD-10-CM

## 2013-06-09 DIAGNOSIS — M792 Neuralgia and neuritis, unspecified: Secondary | ICD-10-CM

## 2013-06-09 DIAGNOSIS — M62838 Other muscle spasm: Secondary | ICD-10-CM | POA: Diagnosis not present

## 2013-06-09 DIAGNOSIS — Z5181 Encounter for therapeutic drug level monitoring: Secondary | ICD-10-CM

## 2013-06-09 DIAGNOSIS — M4316 Spondylolisthesis, lumbar region: Secondary | ICD-10-CM

## 2013-06-09 DIAGNOSIS — F418 Other specified anxiety disorders: Secondary | ICD-10-CM

## 2013-06-09 DIAGNOSIS — R269 Unspecified abnormalities of gait and mobility: Secondary | ICD-10-CM | POA: Insufficient documentation

## 2013-06-09 DIAGNOSIS — Z79899 Other long term (current) drug therapy: Secondary | ICD-10-CM

## 2013-06-09 MED ORDER — BACLOFEN 10 MG PO TABS: 10.0000 mg | ORAL_TABLET | Freq: Three times a day (TID) | ORAL | Status: DC

## 2013-06-09 MED ORDER — OXYCODONE-ACETAMINOPHEN 7.5-325 MG PO TABS: 1.0000 | ORAL_TABLET | Freq: Four times a day (QID) | ORAL | Status: DC | PRN

## 2013-06-09 MED ORDER — TIZANIDINE HCL 2 MG PO CAPS: 2.0000 mg | ORAL_CAPSULE | Freq: Three times a day (TID) | ORAL | Status: DC | PRN

## 2013-06-09 NOTE — Progress Notes (Signed)
Subjective:    Patient ID: Mariah Morris, female    DOB: 1970-11-03, 43 y.o.   MRN: [id number]  HPI: Mariah Morris is a 43 year old female who returns for follow up for chronic pain and medication refill. She says her pain is located on the right side of her neck and lower back. She rates her pain 7. She uses heat therapy and icy hot. Her current exercise regime is walking and stretching exercises. She is still trying to lose weight and follow a healthy living diet.  Pain Inventory Average Pain 7 Pain Right Now 7 My pain is constant, sharp, stabbing, tingling and aching  In the last 24 hours, has pain interfered with the following? General activity 5 Relation with others 5 Enjoyment of life 5 What TIME of day is your pain at its worst? evening and night Sleep (in general) Fair  Pain is worse with: walking, sitting and standing Pain improves with: rest, heat/ice and medication Relief from Meds: 7  Mobility Do you have any goals in this area?  no  Function Do you have any goals in this area?  no  Neuro/Psych numbness trouble walking spasms dizziness confusion anxiety  Prior Studies Any changes since last visit?  no  Physicians involved in your care Any changes since last visit?  no   Family History  Problem Relation Age of Onset  . Hypertension Mother   . Diabetes Father   . Hypertension Father    History   Social History  . Marital Status: Unknown    Spouse Name: N/A    Number of Children: N/A  . Years of Education: N/A   Social History Main Topics  . Smoking status: Never Smoker   . Smokeless tobacco: Never Used  . Alcohol Use: None  . Drug Use: None  . Sexual Activity: None   Other Topics Concern  . None   Social History Narrative  . None   Past Surgical History  Procedure Laterality Date  . Ovarian cyst removal    . Cesarean section      x2   Past Medical History  Diagnosis Date  . Neuromuscular disorder 2011    MS  .  Hypertension   . GERD (gastroesophageal reflux disease)   . Gestational diabetes    BP 121/74  Pulse 88  Resp 14  Ht 5\' 2"  (1.575 m)  Wt 294 lb (133.358 kg)  BMI 53.76 kg/m2  SpO2 99%  Opioid Risk Score:   Fall Risk Score: Low Fall Risk (0-5 points) (patient educated handout declined)   Review of Systems  Musculoskeletal: Positive for arthralgias, gait problem and myalgias.  Neurological: Positive for dizziness and numbness.  Psychiatric/Behavioral: Positive for confusion. The patient is nervous/anxious.   All other systems reviewed and are negative.      Objective:   Physical Exam  Nursing note and vitals reviewed. Constitutional: She is oriented to person, place, and time. She appears well-developed and well-nourished.  Morbid Obese  HENT:  Head: Normocephalic.  Neck:  Cervical Paraspinal Tenderness: Right side. C- 4- C-6  Cardiovascular: Normal rate, regular rhythm and normal heart sounds.   Pulmonary/Chest: Effort normal and breath sounds normal.  Musculoskeletal:  Normal Muscle Bulk: Muscle Testing Reveals Upper Extremities: Full ROM and Muscle Strength 5/5. Lumbar Paraspinal Tenderness: L-3- L-5. Lower Extremities: Bilateral Flexion in lower extremities produces pain into lower extremity. Arises from chair with ease Narrow Based Gait.  Neurological: She is alert and oriented to person, place, and  time.  Skin: Skin is warm and dry.  Psychiatric: She has a normal mood and affect.          Assessment & Plan:  1. Multiple Sclerosis: Neurology Following. 2. Low back pain: Continue Current exercise Regime. Refilled: oxyCODONE 7.5/325 mg one tablet every 6 hours as need for pain #100. 3. Right sided Neck and Shoulder pain: Continue using baclofen, zanaflex and gabapentin. Also continue with exercise,heat Therapy and icy hot. 4. Depression/anxiety: Continue Current Medication Regime: Xaxax and Cymbalta. Continue to monitor. 6. Morbid obesity: Continue with  weight Loss and Healthy Living Regimen.  20 minutes of face to face patient care time was spent during this visit. All questions were encouraged and answered.  F/U in 1 month

## 2013-06-17 DIAGNOSIS — J309 Allergic rhinitis, unspecified: Secondary | ICD-10-CM | POA: Diagnosis not present

## 2013-07-04 ENCOUNTER — Encounter: Payer: Medicare Other | Attending: Physical Medicine & Rehabilitation | Admitting: Registered Nurse

## 2013-07-04 ENCOUNTER — Encounter: Payer: Self-pay | Admitting: Registered Nurse

## 2013-07-04 VITALS — BP 141/76 | HR 80 | Resp 14 | Ht 62.0 in | Wt 297.0 lb

## 2013-07-04 DIAGNOSIS — Z79899 Other long term (current) drug therapy: Secondary | ICD-10-CM

## 2013-07-04 DIAGNOSIS — M545 Low back pain, unspecified: Secondary | ICD-10-CM | POA: Diagnosis not present

## 2013-07-04 DIAGNOSIS — R269 Unspecified abnormalities of gait and mobility: Secondary | ICD-10-CM

## 2013-07-04 DIAGNOSIS — F418 Other specified anxiety disorders: Secondary | ICD-10-CM

## 2013-07-04 DIAGNOSIS — M792 Neuralgia and neuritis, unspecified: Secondary | ICD-10-CM

## 2013-07-04 DIAGNOSIS — M62838 Other muscle spasm: Secondary | ICD-10-CM

## 2013-07-04 DIAGNOSIS — G35 Multiple sclerosis: Secondary | ICD-10-CM

## 2013-07-04 DIAGNOSIS — IMO0002 Reserved for concepts with insufficient information to code with codable children: Secondary | ICD-10-CM | POA: Insufficient documentation

## 2013-07-04 DIAGNOSIS — M4310 Spondylolisthesis, site unspecified: Secondary | ICD-10-CM | POA: Diagnosis not present

## 2013-07-04 DIAGNOSIS — F341 Dysthymic disorder: Secondary | ICD-10-CM | POA: Diagnosis not present

## 2013-07-04 DIAGNOSIS — R2689 Other abnormalities of gait and mobility: Secondary | ICD-10-CM

## 2013-07-04 DIAGNOSIS — M4316 Spondylolisthesis, lumbar region: Secondary | ICD-10-CM

## 2013-07-04 DIAGNOSIS — Z5181 Encounter for therapeutic drug level monitoring: Secondary | ICD-10-CM

## 2013-07-04 MED ORDER — OXYCODONE-ACETAMINOPHEN 7.5-325 MG PO TABS: 1.0000 | ORAL_TABLET | Freq: Four times a day (QID) | ORAL | Status: DC | PRN

## 2013-07-04 NOTE — Progress Notes (Signed)
Subjective:    Patient ID: Mariah Morris, female    DOB: 05/06/1970, 43 y.o.   MRN: [id number]  HPI: Ms. Katrinna Travieso is a 43 year old female who returns for follow up for chronic pain and medication refill. She says her pain is located in her lower back and lower extremities right greater than left. She also has spasms in her feet right greater than left. She rates her pain 7. Her current exercise regime is walking and performing stretching exercises. She is still trying to lose weight she feels as though she's at a stand still encourage to keep on trying. She had a bout of vomiting during her assessment she feels as though it was something she ate,she feels better now. She forgot her medication bottle, she was educated to bring her medication bottle to every visit. If this happens again we won't be able to fill her prescription until she brings in the bottle. She verbalizes understanding.  Pain Inventory Average Pain 7 Pain Right Now 7 My pain is constant, burning, stabbing and aching  In the last 24 hours, has pain interfered with the following? General activity 4 Relation with others 5 Enjoyment of life 5 What TIME of day is your pain at its worst? evening Sleep (in general) Fair  Pain is worse with: walking, bending, sitting, standing and some activites Pain improves with: therapy/exercise and medication Relief from Meds: 7  Mobility Do you have any goals in this area?  no  Function Do you have any goals in this area?  no  Neuro/Psych trouble walking spasms confusion  Prior Studies Any changes since last visit?  no  Physicians involved in your care Any changes since last visit?  no   Family History  Problem Relation Age of Onset  . Hypertension Mother   . Diabetes Father   . Hypertension Father    History   Social History  . Marital Status: Unknown    Spouse Name: N/A    Number of Children: N/A  . Years of Education: N/A   Social History Main  Topics  . Smoking status: Never Smoker   . Smokeless tobacco: Never Used  . Alcohol Use: None  . Drug Use: None  . Sexual Activity: None   Other Topics Concern  . None   Social History Narrative  . None   Past Surgical History  Procedure Laterality Date  . Ovarian cyst removal    . Cesarean section      x2   Past Medical History  Diagnosis Date  . Neuromuscular disorder 2011    MS  . Hypertension   . GERD (gastroesophageal reflux disease)   . Gestational diabetes    BP 141/76  Pulse 80  Resp 14  Ht 5\' 2"  (1.575 m)  Wt 297 lb (134.718 kg)  BMI 54.31 kg/m2  SpO2 97%  Opioid Risk Score:   Fall Risk Score: Moderate Fall Risk (6-13 points) (patient educated handout declined)    Review of Systems  Musculoskeletal: Positive for arthralgias, gait problem and myalgias.  Psychiatric/Behavioral: Positive for confusion.  All other systems reviewed and are negative.      Objective:   Physical Exam  Nursing note and vitals reviewed. Constitutional: She is oriented to person, place, and time. She appears well-developed and well-nourished.  HENT:  Head: Normocephalic and atraumatic.  Neck: Normal range of motion. Neck supple.  Cardiovascular: Normal rate and regular rhythm.   Pulmonary/Chest: Effort normal and breath sounds normal.  Musculoskeletal:  Normal Muscle Bulk and Muscle testing Reveals:  Upper Extremities: Right arm Decreased ROM 150 degrees and muscle strength 4/5 Left Arm Decreased ROM 90 degrees and muscle strength 3/5 Thoracic Paraspinal Tenderness T-7- T-11 Lumbar Paraspinal Tenderness: L-3- L-5 Lower Extremities: Left Leg Full ROM and Muscle Strength 5/5 Right Leg Flexion produces pain into her right leg Arises from chair with ease Narrow based gait  Neurological: She is alert and oriented to person, place, and time.  Skin: Skin is warm and dry.  Psychiatric: She has a normal mood and affect.          Assessment & Plan:  1. Multiple  Sclerosis: Neurology Following.  2. Low back pain: Continue Current exercise Regime. Continue using heat therapy. Refilled: oxyCODONE 7.5/325 mg one tablet every 6 hours as need for pain #100.  3. Muscle Spasms: Continue using baclofen, zanaflex and  continue with exercise,heat Therapy and icy hot. 4. Depression/anxiety: Continue Current Medication Regime: Xaxax and Cymbalta. Continue to monitor.  6. Morbid obesity: Continue with weight Loss and Healthy Living Regimen.   20 minutes of face to face patient care time was spent during this visit. All questions were encouraged and answered.  F/U in 1 mont

## 2013-07-07 ENCOUNTER — Ambulatory Visit: Payer: Medicare Other | Admitting: Registered Nurse

## 2013-08-04 ENCOUNTER — Encounter: Payer: Medicare Other | Attending: Physical Medicine & Rehabilitation | Admitting: Registered Nurse

## 2013-08-04 ENCOUNTER — Encounter: Payer: Self-pay | Admitting: Registered Nurse

## 2013-08-04 VITALS — BP 158/75 | HR 87 | Resp 16 | Ht 62.0 in | Wt 302.0 lb

## 2013-08-04 DIAGNOSIS — M62838 Other muscle spasm: Secondary | ICD-10-CM

## 2013-08-04 DIAGNOSIS — Z79899 Other long term (current) drug therapy: Secondary | ICD-10-CM

## 2013-08-04 DIAGNOSIS — M545 Low back pain, unspecified: Secondary | ICD-10-CM

## 2013-08-04 DIAGNOSIS — M4316 Spondylolisthesis, lumbar region: Secondary | ICD-10-CM

## 2013-08-04 DIAGNOSIS — M792 Neuralgia and neuritis, unspecified: Secondary | ICD-10-CM

## 2013-08-04 DIAGNOSIS — G35 Multiple sclerosis: Secondary | ICD-10-CM

## 2013-08-04 DIAGNOSIS — F341 Dysthymic disorder: Secondary | ICD-10-CM

## 2013-08-04 DIAGNOSIS — R2689 Other abnormalities of gait and mobility: Secondary | ICD-10-CM

## 2013-08-04 DIAGNOSIS — M4310 Spondylolisthesis, site unspecified: Secondary | ICD-10-CM | POA: Diagnosis not present

## 2013-08-04 DIAGNOSIS — Z5181 Encounter for therapeutic drug level monitoring: Secondary | ICD-10-CM

## 2013-08-04 DIAGNOSIS — F418 Other specified anxiety disorders: Secondary | ICD-10-CM

## 2013-08-04 DIAGNOSIS — IMO0002 Reserved for concepts with insufficient information to code with codable children: Secondary | ICD-10-CM | POA: Diagnosis not present

## 2013-08-04 DIAGNOSIS — G35D Multiple sclerosis, unspecified: Secondary | ICD-10-CM

## 2013-08-04 DIAGNOSIS — M79604 Pain in right leg: Secondary | ICD-10-CM

## 2013-08-04 DIAGNOSIS — R269 Unspecified abnormalities of gait and mobility: Secondary | ICD-10-CM

## 2013-08-04 MED ORDER — OXYCODONE-ACETAMINOPHEN 7.5-325 MG PO TABS: 1.0000 | ORAL_TABLET | Freq: Four times a day (QID) | ORAL | Status: DC | PRN

## 2013-08-04 NOTE — Progress Notes (Signed)
Subjective:    Patient ID: Mariah Morris, female    DOB: 1970/04/14, 43 y.o.   MRN: [id number]  HPI: Ms. Suesan Mohrmann is a 43 year old female who returns for follow up for chronic pain and medication refill. She says her pain is located in her lower back and lower extremities right greater than left. She also has spasms in her feet right greater than left. She rates her pain 7. Her current exercise regime is walking and performing stretching exercises. She is still trying to lose weight even though she gained weight over the holiday weekend encourage to keep on trying.  Spent a lot of time discussing various healthy diet regimen and meals, she verbalized understanding.   Pain Inventory Average Pain 7 Pain Right Now 7 My pain is constant, sharp, burning, stabbing and aching  In the last 24 hours, has pain interfered with the following? General activity 5 Relation with others 5 Enjoyment of life 5 What TIME of day is your pain at its worst? evening, night Sleep (in general) Fair  Pain is worse with: walking, bending, sitting and standing Pain improves with: rest, heat/ice, therapy/exercise and medication Relief from Meds: 7  Mobility walk without assistance transfers alone Do you have any goals in this area?  no  Function Do you have any goals in this area?  no  Neuro/Psych numbness tingling trouble walking spasms confusion anxiety  Prior Studies Any changes since last visit?  no  Physicians involved in your care Any changes since last visit?  no   Family History  Problem Relation Age of Onset  . Hypertension Mother   . Diabetes Father   . Hypertension Father    History   Social History  . Marital Status: Unknown    Spouse Name: N/A    Number of Children: N/A  . Years of Education: N/A   Social History Main Topics  . Smoking status: Never Smoker   . Smokeless tobacco: Never Used  . Alcohol Use: None  . Drug Use: None  . Sexual Activity: None     Other Topics Concern  . None   Social History Narrative  . None   Past Surgical History  Procedure Laterality Date  . Ovarian cyst removal    . Cesarean section      x2   Past Medical History  Diagnosis Date  . Neuromuscular disorder 2011    MS  . Hypertension   . GERD (gastroesophageal reflux disease)   . Gestational diabetes    BP 158/75  Pulse 87  Resp 16  Ht 5\' 2"  (1.575 m)  Wt 302 lb (136.986 kg)  BMI 55.22 kg/m2  SpO2 97%  Opioid Risk Score:   Fall Risk Score: Low Fall Risk (0-5 points) (pt educated on fall risk, brochure given to pt previously)   Review of Systems  Musculoskeletal: Positive for back pain and neck pain.  Neurological: Positive for numbness.       Tingling, spasms  Psychiatric/Behavioral: Positive for confusion. The patient is nervous/anxious.   All other systems reviewed and are negative.      Objective:   Physical Exam  Nursing note and vitals reviewed. Constitutional: She is oriented to person, place, and time. She appears well-developed and well-nourished.  HENT:  Head: Normocephalic.  Neck: Normal range of motion. Neck supple.  Cardiovascular: Normal rate and regular rhythm.   Pulmonary/Chest: Effort normal and breath sounds normal.  Musculoskeletal:  Normal Muscle Bulk and Muscle testing Reveals:  Upper Extremities: Full ROM and Muscle Strength 4/5 Spinal Forward Flexion 45 degrees extension 10 degrees Lumbar Paraspinal Tenderness: L-3- L-5 Lower Extremities: Full ROM and Muscle Strength 5/5 Bilateral Flexion Produces pain into Patella Arises from chair with ease Narrow based gait   Neurological: She is alert and oriented to person, place, and time.  Skin: Skin is warm and dry.  Psychiatric: She has a normal mood and affect.          Assessment & Plan:  1. Multiple Sclerosis: Neurology Following.  2. Low back pain: Continue Current exercise Regime. Continue using heat therapy.  Refilled: oxyCODONE 7.5/325 mg one  tablet every 6 hours as need for pain #100.  3. Muscle Spasms: Continue using baclofen, zanaflex and continue with exercise,heat Therapy and icy hot. 4. Depression/anxiety: Continue Current Medication Regime: Xaxax and Cymbalta. Continue to monitor.  6. Morbid obesity: Continue with weight Loss and Healthy Living Regimen.   20 minutes of face to face patient care time was spent during this visit. All questions were encouraged and answered.   F/U in 1 month

## 2013-09-02 ENCOUNTER — Encounter: Payer: Medicare Other | Attending: Physical Medicine & Rehabilitation | Admitting: Registered Nurse

## 2013-09-02 ENCOUNTER — Encounter: Payer: Self-pay | Admitting: Registered Nurse

## 2013-09-02 VITALS — BP 146/63 | HR 84 | Resp 14 | Wt 301.0 lb

## 2013-09-02 DIAGNOSIS — IMO0002 Reserved for concepts with insufficient information to code with codable children: Secondary | ICD-10-CM | POA: Insufficient documentation

## 2013-09-02 DIAGNOSIS — F341 Dysthymic disorder: Secondary | ICD-10-CM | POA: Diagnosis not present

## 2013-09-02 DIAGNOSIS — M792 Neuralgia and neuritis, unspecified: Secondary | ICD-10-CM

## 2013-09-02 DIAGNOSIS — M545 Low back pain, unspecified: Secondary | ICD-10-CM

## 2013-09-02 DIAGNOSIS — M4310 Spondylolisthesis, site unspecified: Secondary | ICD-10-CM | POA: Diagnosis not present

## 2013-09-02 DIAGNOSIS — M79604 Pain in right leg: Secondary | ICD-10-CM

## 2013-09-02 DIAGNOSIS — G35D Multiple sclerosis, unspecified: Secondary | ICD-10-CM

## 2013-09-02 DIAGNOSIS — F418 Other specified anxiety disorders: Secondary | ICD-10-CM

## 2013-09-02 DIAGNOSIS — Z5181 Encounter for therapeutic drug level monitoring: Secondary | ICD-10-CM

## 2013-09-02 DIAGNOSIS — R2689 Other abnormalities of gait and mobility: Secondary | ICD-10-CM

## 2013-09-02 DIAGNOSIS — M62838 Other muscle spasm: Secondary | ICD-10-CM

## 2013-09-02 DIAGNOSIS — G35 Multiple sclerosis: Secondary | ICD-10-CM

## 2013-09-02 DIAGNOSIS — M4316 Spondylolisthesis, lumbar region: Secondary | ICD-10-CM

## 2013-09-02 DIAGNOSIS — Z79899 Other long term (current) drug therapy: Secondary | ICD-10-CM

## 2013-09-02 DIAGNOSIS — R269 Unspecified abnormalities of gait and mobility: Secondary | ICD-10-CM

## 2013-09-02 MED ORDER — OXYCODONE-ACETAMINOPHEN 7.5-325 MG PO TABS: 1.0000 | ORAL_TABLET | Freq: Four times a day (QID) | ORAL | Status: DC | PRN

## 2013-09-02 NOTE — Progress Notes (Signed)
Subjective:    Patient ID: Mariah InnocentShantelle L Morris, female    DOB: [birth year], 43 y.o.   MRN: [id number]  HPI: Ms. Mariah GinShantelle Secrist is a 43 year old female who returns for follow up for chronic pain and medication refill. She says her pain is located in her bilateral arms,and lower extremities. She rates her pain 7. Her current exercise regime is walking and performing stretching exercises. She is not consistent with her exercise regime. She has been encouraged to increase her activity level.She was encouraged to perform chair exercises, these have been demonstrated to her. She verbalizes understanding. She has been out of her HCTZ with pitting edema noted. She wasn't able to get her medication due to financial hardship. She has been out of her medication for two weeks. She will be picking them up today.Educated on medication compliance and she verbalizes understanding.  Pain Inventory Average Pain 7 Pain Right Now 7 My pain is intermittent and constant  In the last 24 hours, has pain interfered with the following? General activity 5 Relation with others 5 Enjoyment of life 5 What TIME of day is your pain at its worst? daytime and evening Sleep (in general) Fair  Pain is worse with: walking, bending, sitting, standing and some activites Pain improves with: rest, heat/ice and medication Relief from Meds: 7  Mobility Do you have any goals in this area?  no  Function Do you have any goals in this area?  no  Neuro/Psych trouble walking spasms confusion anxiety  Prior Studies Any changes since last visit?  no  Physicians involved in your care Any changes since last visit?  no   Family History  Problem Relation Age of Onset  . Hypertension Mother   . Diabetes Father   . Hypertension Father    History   Social History  . Marital Status: Single    Spouse Name: N/A    Number of Children: N/A  . Years of Education: N/A   Social History Main Topics  . Smoking status: Never  Smoker   . Smokeless tobacco: Never Used  . Alcohol Use: None  . Drug Use: None  . Sexual Activity: None   Other Topics Concern  . None   Social History Narrative  . None   Past Surgical History  Procedure Laterality Date  . Ovarian cyst removal    . Cesarean section      x2   Past Medical History  Diagnosis Date  . Neuromuscular disorder 2011    MS  . Hypertension   . GERD (gastroesophageal reflux disease)   . Gestational diabetes    BP 146/63  Pulse 84  Resp 14  Wt 301 lb (136.533 kg)  SpO2 99%  Opioid Risk Score:   Fall Risk Score: Low Fall Risk (0-5 points) (previously educated and given handout) Review of Systems  Musculoskeletal: Positive for gait problem.       Spasms  Psychiatric/Behavioral: Positive for confusion. The patient is nervous/anxious.   All other systems reviewed and are negative.      Objective:   Physical Exam  Nursing note and vitals reviewed. Constitutional: She is oriented to person, place, and time. She appears well-developed and well-nourished.  Morbid Obesity  HENT:  Head: Normocephalic and atraumatic.  Neck: Normal range of motion. Neck supple.  Cardiovascular: Normal rate and regular rhythm.   Pulmonary/Chest: Effort normal and breath sounds normal.  Musculoskeletal: She exhibits edema.  Normal Muscle Bulk and Muscle Testing Reveals: Upper Extremities: Full  ROM and Muscle Strength 5/5 Thoracic and Lumbar Hypersensitivity Lower Extremities: Full ROM and Muscle Strength 5/5 Right Leg Flexion Produces Pain into the leg Left Leg Flexion Produces Pain into Leg Arises from chair with ease Narrow Based Gait   Neurological: She is alert and oriented to person, place, and time.  Skin: Skin is warm and dry.  Psychiatric: She has a normal mood and affect.          Assessment & Plan:  1. Multiple Sclerosis: Neurology Following: Dr. Sherryll Burger 2. Low back pain: Continue Current exercise Regime. Continue using heat therapy.  Refilled:  oxyCODONE 7.5/325 mg one tablet every 6 hours as need for pain #100.  3. Muscle Spasms: Continue using zanaflex and continue with exercise,heat Therapy and icy hot. 4. Depression/anxiety: Continue Current Medication Regime: Xaxax and Cymbalta. Continue to monitor.  6. Morbid obesity: Continue with weight Loss and Healthy Living Regimen.   20 minutes of face to face patient care time was spent during this visit. All questions were encouraged and answered.   F/U in 1 month

## 2013-10-02 ENCOUNTER — Encounter: Payer: Medicare Other | Attending: Physical Medicine & Rehabilitation | Admitting: Registered Nurse

## 2013-10-02 ENCOUNTER — Encounter: Payer: Self-pay | Admitting: Registered Nurse

## 2013-10-02 VITALS — BP 147/71 | HR 75 | Resp 16 | Ht 62.0 in | Wt 301.0 lb

## 2013-10-02 DIAGNOSIS — IMO0002 Reserved for concepts with insufficient information to code with codable children: Secondary | ICD-10-CM

## 2013-10-02 DIAGNOSIS — F341 Dysthymic disorder: Secondary | ICD-10-CM | POA: Diagnosis not present

## 2013-10-02 DIAGNOSIS — M792 Neuralgia and neuritis, unspecified: Secondary | ICD-10-CM

## 2013-10-02 DIAGNOSIS — F418 Other specified anxiety disorders: Secondary | ICD-10-CM

## 2013-10-02 DIAGNOSIS — M79604 Pain in right leg: Secondary | ICD-10-CM

## 2013-10-02 DIAGNOSIS — G35 Multiple sclerosis: Secondary | ICD-10-CM | POA: Diagnosis not present

## 2013-10-02 DIAGNOSIS — M545 Low back pain, unspecified: Secondary | ICD-10-CM | POA: Insufficient documentation

## 2013-10-02 DIAGNOSIS — M4316 Spondylolisthesis, lumbar region: Secondary | ICD-10-CM

## 2013-10-02 DIAGNOSIS — M4310 Spondylolisthesis, site unspecified: Secondary | ICD-10-CM | POA: Diagnosis not present

## 2013-10-02 MED ORDER — OXYCODONE-ACETAMINOPHEN 7.5-325 MG PO TABS: 1.0000 | ORAL_TABLET | Freq: Four times a day (QID) | ORAL | Status: DC | PRN

## 2013-10-02 MED ORDER — BACLOFEN 10 MG PO TABS: 10.0000 mg | ORAL_TABLET | Freq: Three times a day (TID) | ORAL | Status: DC

## 2013-10-02 NOTE — Progress Notes (Signed)
Subjective:    Patient ID: Mariah Morris, female    DOB: 1970/11/15, 43 y.o.   MRN: [id number]  HPI: [name] is a 43 year old female who returns for follow up for chronic pain and medication refill. She says her pain is located in her mid-back. She rates her pain 7. Her current exercise regime is walking and performing stretching exercises.   Pain Inventory Average Pain 7 Pain Right Now 7 My pain is constant, sharp, burning, dull, stabbing and aching  In the last 24 hours, has pain interfered with the following? General activity 5 Relation with others 5 Enjoyment of life 5 What TIME of day is your pain at its worst? evening, night Sleep (in general) Fair  Pain is worse with: walking, bending and some activites Pain improves with: rest, heat/ice and medication Relief from Meds: 7  Mobility walk without assistance transfers alone Do you have any goals in this area?  no  Function Do you have any goals in this area?  no  Neuro/Psych trouble walking spasms anxiety  Prior Studies Any changes since last visit?  no  Physicians involved in your care Any changes since last visit?  no   Family History  Problem Relation Age of Onset  . Hypertension Mother   . Diabetes Father   . Hypertension Father    History   Social History  . Marital Status: Single    Spouse Name: N/A    Number of Children: N/A  . Years of Education: N/A   Social History Main Topics  . Smoking status: Never Smoker   . Smokeless tobacco: Never Used  . Alcohol Use: None  . Drug Use: None  . Sexual Activity: None   Other Topics Concern  . None   Social History Narrative  . None   Past Surgical History  Procedure Laterality Date  . Ovarian cyst removal    . Cesarean section      x2   Past Medical History  Diagnosis Date  . Neuromuscular disorder 2011    MS  . Hypertension   . GERD (gastroesophageal reflux disease)   . Gestational diabetes    BP 147/71  Pulse  75  Resp 16  Ht  (1.575 m)  Wt 301 lb (136.533 kg)  BMI 55.04 kg/m2  SpO2 99%  Opioid Risk Score:   Fall Risk Score: Low Fall Risk (0-5 points) (pt educated declined handout)    Review of Systems  Musculoskeletal: Positive for back pain, gait problem and neck pain.  Neurological:       Spasms  Psychiatric/Behavioral: The patient is nervous/anxious.   All other systems reviewed and are negative.      Objective:   Physical Exam  Nursing note and vitals reviewed. Constitutional: She is oriented to person, place, and time. She appears well-developed and well-nourished.  HENT:  Head: Normocephalic and atraumatic.  Neck: Normal range of motion. Neck supple.  Cardiovascular: Normal rate and regular rhythm.   Pulmonary/Chest: Effort normal and breath sounds normal.  Musculoskeletal:  Normal Muscle Bulk and Muscle Testing Reveals: Upper Extremities: Full ROM and Muscle Strength 5/5 Spinal Forward Flexion 45 Degrees and Extension 10 Degrees Thoracic Paraspinal Tenderness: T-7- T-10 Lower Extremities: Full ROM and Muscle Strength 5/5 Arises from chair with ease Narrow based gait  Neurological: She is alert and oriented to person, place, and time.  Skin: Skin is warm and dry.  Psychiatric: She has a normal mood and affect.  Assessment & Plan:  1. Multiple Sclerosis: Neurology Following: Dr. Sherryll Burger  2. Low back pain: Continue Current exercise Regime. Continue using heat therapy.  Refilled: oxyCODONE 7.5/325 mg one tablet every 6 hours as need for pain #100.  3. Muscle Spasms: Continue using Baclofen and continue with exercise,heat Therapy and icy hot. 4. Depression/anxiety: Continue Current Medication Regime: Xaxax. She says her Cymbalta was discontinued. She is not depressed at this time. Continue to monitor.  6. Morbid obesity: Continue with weight Loss and Healthy Living Regimen.   15 minutes of face to face patient care time was spent during this visit. All  questions were encouraged and answered.   F/U in 1 month

## 2013-10-30 ENCOUNTER — Encounter: Payer: Self-pay | Admitting: Registered Nurse

## 2013-10-30 ENCOUNTER — Encounter: Payer: Medicare Other | Attending: Physical Medicine & Rehabilitation | Admitting: Registered Nurse

## 2013-10-30 VITALS — BP 147/72 | HR 86 | Resp 18 | Ht 62.0 in | Wt 303.8 lb

## 2013-10-30 DIAGNOSIS — M4316 Spondylolisthesis, lumbar region: Secondary | ICD-10-CM

## 2013-10-30 DIAGNOSIS — G35 Multiple sclerosis: Secondary | ICD-10-CM | POA: Diagnosis not present

## 2013-10-30 DIAGNOSIS — M545 Low back pain, unspecified: Secondary | ICD-10-CM

## 2013-10-30 DIAGNOSIS — G35D Multiple sclerosis, unspecified: Secondary | ICD-10-CM

## 2013-10-30 DIAGNOSIS — Z5181 Encounter for therapeutic drug level monitoring: Secondary | ICD-10-CM | POA: Diagnosis not present

## 2013-10-30 DIAGNOSIS — Z79899 Other long term (current) drug therapy: Secondary | ICD-10-CM | POA: Diagnosis not present

## 2013-10-30 DIAGNOSIS — F418 Other specified anxiety disorders: Secondary | ICD-10-CM | POA: Diagnosis not present

## 2013-10-30 DIAGNOSIS — M792 Neuralgia and neuritis, unspecified: Secondary | ICD-10-CM

## 2013-10-30 MED ORDER — OXYCODONE-ACETAMINOPHEN 7.5-325 MG PO TABS: 1.0000 | ORAL_TABLET | Freq: Four times a day (QID) | ORAL | Status: DC | PRN

## 2013-10-30 NOTE — Progress Notes (Signed)
Subjective:    Patient ID: Mariah Morris, female    DOB: 1970/03/10, 43 y.o.   MRN: [id number]  HPI:[name] is a 43 year old female who returns for follow up for chronic pain and medication refill. She says her pain is located in her lower back and bilateral lower extremities. She rates her pain 8. Her current exercise regime is walking and performing stretching exercises.    Pain Inventory Average Pain 7 Pain Right Now 8 My pain is constant, sharp, dull, stabbing and aching  In the last 24 hours, has pain interfered with the following? General activity 5 Relation with others 5 Enjoyment of life 5 What TIME of day is your pain at its worst? evening and night Sleep (in general) Fair  Pain is worse with: walking, bending, sitting, standing and some activites Pain improves with: rest, heat/ice and medication Relief from Meds: 7  Mobility walk without assistance  Function not employed: date last employed .  Neuro/Psych trouble walking spasms anxiety  Prior Studies Any changes since last visit?  no  Physicians involved in your care Any changes since last visit?  no   Family History  Problem Relation Age of Onset  . Hypertension Mother   . Diabetes Father   . Hypertension Father    History   Social History  . Marital Status: Single    Spouse Name: N/A    Number of Children: N/A  . Years of Education: N/A   Social History Main Topics  . Smoking status: Never Smoker   . Smokeless tobacco: Never Used  . Alcohol Use: Not on file  . Drug Use: Not on file  . Sexual Activity: Not on file   Other Topics Concern  . Not on file   Social History Narrative  . No narrative on file   Past Surgical History  Procedure Laterality Date  . Ovarian cyst removal    . Cesarean section      x2   Past Medical History  Diagnosis Date  . Neuromuscular disorder 2011    MS  . Hypertension   . GERD (gastroesophageal reflux disease)   . Gestational  diabetes    Pulse 88  Resp 14  Ht 5\' 2"  (1.575 m)  Wt 303 lb 12.8 oz (137.803 kg)  BMI 55.55 kg/m2  SpO2 98%  Opioid Risk Score:   Fall Risk Score: Low Fall Risk (0-5 points)   Review of Systems     Objective:   Physical Exam  Nursing note and vitals reviewed. Constitutional: She is oriented to person, place, and time. She appears well-developed and well-nourished.  HENT:  Head: Normocephalic and atraumatic.  Neck: Normal range of motion. Neck supple.  Cardiovascular: Normal rate and regular rhythm.   Pulmonary/Chest: Effort normal and breath sounds normal.  Musculoskeletal:  Normal Muscle Bulk and Muscle Testing Reveals: Upper Extremities: Full ROM and Muscle Strength 5/5 Lumbar Paraspinal Tenderness: L-3- L-5 Lower Extremities: Decreased ROM and Muscle Strength 5/5 Bilateral Lower Extremities Flexion: Produces Pain into lower extremities Arises from chair with ease Narrow Based Gait  Neurological: She is alert and oriented to person, place, and time.  Skin: Skin is warm.  Psychiatric: She has a normal mood and affect.          Assessment & Plan:  1. Multiple Sclerosis: Neurology Following: Dr. Sherryll Burger  2. Low back pain: Continue Current exercise Regime. Continue using heat therapy.  Refilled: oxyCODONE 7.5/325 mg one tablet every 6 hours as  need for pain #100.  3. Muscle Spasms: Continue Baclofen 4. Depression/anxiety: Continue Current Medication Regime.  6. Morbid obesity: Continue with weight Loss and Healthy Living Regimen.   15 minutes of face to face patient care time was spent during this visit. All questions were encouraged and answered.   F/U in 1 month

## 2013-11-27 ENCOUNTER — Encounter (HOSPITAL_BASED_OUTPATIENT_CLINIC_OR_DEPARTMENT_OTHER): Payer: Medicare Other | Admitting: Registered Nurse

## 2013-11-27 ENCOUNTER — Encounter: Payer: Self-pay | Admitting: Registered Nurse

## 2013-11-27 VITALS — BP 109/40 | HR 86 | Resp 14 | Ht 62.0 in | Wt 301.0 lb

## 2013-11-27 DIAGNOSIS — F418 Other specified anxiety disorders: Secondary | ICD-10-CM | POA: Diagnosis not present

## 2013-11-27 DIAGNOSIS — M4316 Spondylolisthesis, lumbar region: Secondary | ICD-10-CM | POA: Diagnosis not present

## 2013-11-27 DIAGNOSIS — M545 Low back pain, unspecified: Secondary | ICD-10-CM

## 2013-11-27 DIAGNOSIS — M792 Neuralgia and neuritis, unspecified: Secondary | ICD-10-CM

## 2013-11-27 DIAGNOSIS — Z5181 Encounter for therapeutic drug level monitoring: Secondary | ICD-10-CM

## 2013-11-27 DIAGNOSIS — G35 Multiple sclerosis: Secondary | ICD-10-CM | POA: Diagnosis not present

## 2013-11-27 DIAGNOSIS — Z79899 Other long term (current) drug therapy: Secondary | ICD-10-CM

## 2013-11-27 MED ORDER — OXYCODONE-ACETAMINOPHEN 7.5-325 MG PO TABS: 1.0000 | ORAL_TABLET | Freq: Four times a day (QID) | ORAL | Status: DC | PRN

## 2013-11-27 NOTE — Progress Notes (Signed)
Subjective:    Patient ID: Mariah Morris, female    DOB: [birth year], 43 y.o.   MRN: [id number]  HPI: Ms. Kathryne GinShantelle Stettner is a 43 year old female who returns for follow up for chronic pain and medication refill. She says her pain is located in her lower back and bilateral lower extremities. She rates her pain 7. Her current exercise regime is walking and performing stretching exercises.   Pain Inventory Average Pain 7 Pain Right Now 7 My pain is constant, sharp, stabbing, tingling and aching  In the last 24 hours, has pain interfered with the following? General activity 5 Relation with others 5 Enjoyment of life 5 What TIME of day is your pain at its worst? evening, night Sleep (in general) Fair  Pain is worse with: walking, bending, sitting, standing and some activites Pain improves with: rest, heat/ice and medication Relief from Meds: 7  Mobility walk without assistance how many minutes can you walk? 10 ability to climb steps?  no do you drive?  yes Do you have any goals in this area?  no  Function not employed: date last employed unsure disabled: date disabled unsure I need assistance with the following:  feeding, dressing, bathing, toileting, meal prep, household duties and shopping Do you have any goals in this area?  no  Neuro/Psych trouble walking spasms confusion anxiety  Prior Studies Any changes since last visit?  no  Physicians involved in your care Any changes since last visit?  no   Family History  Problem Relation Age of Onset  . Hypertension Mother   . Diabetes Father   . Hypertension Father    History   Social History  . Marital Status: Single    Spouse Name: N/A    Number of Children: N/A  . Years of Education: N/A   Social History Main Topics  . Smoking status: Never Smoker   . Smokeless tobacco: Never Used  . Alcohol Use: None  . Drug Use: None  . Sexual Activity: None   Other Topics Concern  . None   Social History  Narrative  . None   Past Surgical History  Procedure Laterality Date  . Ovarian cyst removal    . Cesarean section      x2   Past Medical History  Diagnosis Date  . Neuromuscular disorder 2011    MS  . Hypertension   . GERD (gastroesophageal reflux disease)   . Gestational diabetes    BP 109/40  Pulse 86  Resp 14  Ht 5\' 2"  (1.575 m)  Wt 301 lb (136.533 kg)  BMI 55.04 kg/m2  SpO2 97%  Opioid Risk Score:   Fall Risk Score: Low Fall Risk (0-5 points)  Review of Systems     Objective:   Physical Exam  Nursing note and vitals reviewed. Constitutional: She is oriented to person, place, and time. She appears well-developed and well-nourished.  HENT:  Head: Normocephalic and atraumatic.  Neck: Normal range of motion. Neck supple.  Cardiovascular: Normal rate and regular rhythm.   Pulmonary/Chest: Effort normal and breath sounds normal.  Musculoskeletal:  Normal Muscle Bulk and Muscle testing reveals: Upper extremities: Full ROM and Muscle Strength 5/5 Lumbar Paraspinal tenderness: L-1- L-4 Lower extremities: Full ROM and Muscle Strength 5/5 Arises from chair with slight difficulty Wide Based gait   Neurological: She is alert and oriented to person, place, and time.  Skin: Skin is warm and dry.  Psychiatric: She has a normal mood and affect.  Assessment & Plan:  1. Multiple Sclerosis: Neurology Following: Dr. Sherryll Burger  2. Low back pain: Continue Current exercise Regime. Continue using heat therapy.  Refilled: oxyCODONE 7.5/325 mg one tablet every 6 hours as need for pain #100.  3. Muscle Spasms: Continue Baclofen 4. Depression/anxiety: Continue Current Medication Regime.  6. Morbid obesity: Continue with weight Loss and Healthy Living Regimen. Loss 3 IBS.   15 minutes of face to face patient care time was spent during this visit. All questions were encouraged and answered.  F/U in 1 month

## 2013-12-02 ENCOUNTER — Ambulatory Visit: Payer: Medicare Other | Admitting: Registered Nurse

## 2013-12-29 DIAGNOSIS — F331 Major depressive disorder, recurrent, moderate: Secondary | ICD-10-CM | POA: Diagnosis not present

## 2013-12-30 ENCOUNTER — Encounter: Payer: Self-pay | Admitting: Registered Nurse

## 2013-12-30 ENCOUNTER — Encounter: Payer: Medicare Other | Attending: Physical Medicine & Rehabilitation | Admitting: Registered Nurse

## 2013-12-30 VITALS — BP 127/78 | HR 88 | Resp 14 | Wt 305.0 lb

## 2013-12-30 DIAGNOSIS — G35 Multiple sclerosis: Secondary | ICD-10-CM

## 2013-12-30 DIAGNOSIS — F418 Other specified anxiety disorders: Secondary | ICD-10-CM | POA: Diagnosis not present

## 2013-12-30 DIAGNOSIS — M79604 Pain in right leg: Secondary | ICD-10-CM

## 2013-12-30 DIAGNOSIS — M4316 Spondylolisthesis, lumbar region: Secondary | ICD-10-CM

## 2013-12-30 DIAGNOSIS — M545 Low back pain: Secondary | ICD-10-CM

## 2013-12-30 DIAGNOSIS — Z79899 Other long term (current) drug therapy: Secondary | ICD-10-CM | POA: Diagnosis not present

## 2013-12-30 DIAGNOSIS — M792 Neuralgia and neuritis, unspecified: Secondary | ICD-10-CM | POA: Diagnosis not present

## 2013-12-30 DIAGNOSIS — Z5181 Encounter for therapeutic drug level monitoring: Secondary | ICD-10-CM | POA: Diagnosis not present

## 2013-12-30 MED ORDER — OXYCODONE-ACETAMINOPHEN 7.5-325 MG PO TABS: 1.0000 | ORAL_TABLET | Freq: Four times a day (QID) | ORAL | Status: DC | PRN

## 2013-12-30 NOTE — Progress Notes (Signed)
Subjective:    Patient ID: Mariah Morris, female    DOB: February 10, 1970, 43 y.o.   MRN: [id number]  HPI: [name] is a 43 year old female who returns for follow up for chronic pain and medication refill. She says her pain is located in her lower back and bilateral lower extremities. She rates her pain 7. Her current exercise regime is walking and performing stretching exercises.   Pain Inventory Average Pain 7 Pain Right Now 7 My pain is constant, burning, dull, stabbing and aching  In the last 24 hours, has pain interfered with the following? General activity 5 Relation with others 5 Enjoyment of life 5 What TIME of day is your pain at its worst? evening and ngiht Sleep (in general) Fair  Pain is worse with: walking, bending, sitting and inactivity Pain improves with: rest, heat/ice and medication Relief from Meds: 7  Mobility walk without assistance  Function Do you have any goals in this area?  no  Neuro/Psych trouble walking spasms confusion depression anxiety  Prior Studies Any changes since last visit?  no  Physicians involved in your care Any changes since last visit?  no   Family History  Problem Relation Age of Onset  . Hypertension Mother   . Diabetes Father   . Hypertension Father    History   Social History  . Marital Status: Single    Spouse Name: N/A    Number of Children: N/A  . Years of Education: N/A   Social History Main Topics  . Smoking status: Never Smoker   . Smokeless tobacco: Never Used  . Alcohol Use: None  . Drug Use: None  . Sexual Activity: None   Other Topics Concern  . None   Social History Narrative   Past Surgical History  Procedure Laterality Date  . Ovarian cyst removal    . Cesarean section      x2   Past Medical History  Diagnosis Date  . Neuromuscular disorder 2011    MS  . Hypertension   . GERD (gastroesophageal reflux disease)   . Gestational diabetes    BP 127/78 mmHg  Pulse 88   Resp 14  Wt 305 lb (138.347 kg)  SpO2 100%  Opioid Risk Score:   Fall Risk Score:  (previously educated and given handout) Review of Systems  Musculoskeletal: Positive for gait problem.       Spasms  Psychiatric/Behavioral: Positive for confusion and dysphoric mood. The patient is nervous/anxious.   All other systems reviewed and are negative.      Objective:   Physical Exam  Constitutional: She is oriented to person, place, and time. She appears well-developed and well-nourished.  HENT:  Head: Normocephalic and atraumatic.  Neck: Normal range of motion. Neck supple.  Cardiovascular: Normal rate and regular rhythm.   Pulmonary/Chest: Effort normal and breath sounds normal.  Musculoskeletal:  Normal Muscle Bulk and Muscle Testing Reveals: Upper Extremities: Full ROM and Muscle Strength 5/5 Bilateral AC Joint tenderness Thoracic and Lumbar Hypersensitivity Lower Extremities: Full ROM and Muscle Strength 5/5 Right Leg Flexion Produces Pain into lower extremities Arises from chair with ease Narrow Based gait  Neurological: She is alert and oriented to person, place, and time.  Skin: Skin is warm and dry.  Psychiatric: She has a normal mood and affect.  Nursing note and vitals reviewed.         Assessment & Plan:  1. Multiple Sclerosis: Neurology Following: Dr. Sherryll Burger  2. Low back  pain: Continue Current exercise Regime. Continue using heat therapy.  Refilled: oxyCODONE 7.5/325 mg one tablet every 6 hours as need for pain #100.  3. Muscle Spasms: Continue Baclofen 4. Depression/anxiety: Continue Current Medication Regime.  6. Morbid obesity: Continue with weight Loss and Healthy Living Regimen.  15 minutes of face to face patient care time was spent during this visit. All questions were encouraged and answered.  F/U in 1 month

## 2014-01-27 ENCOUNTER — Encounter (HOSPITAL_BASED_OUTPATIENT_CLINIC_OR_DEPARTMENT_OTHER): Payer: Medicare Other | Admitting: Registered Nurse

## 2014-01-27 ENCOUNTER — Encounter: Payer: Self-pay | Admitting: Registered Nurse

## 2014-01-27 VITALS — BP 142/78 | HR 80 | Resp 14

## 2014-01-27 DIAGNOSIS — M7061 Trochanteric bursitis, right hip: Secondary | ICD-10-CM

## 2014-01-27 DIAGNOSIS — Z5181 Encounter for therapeutic drug level monitoring: Secondary | ICD-10-CM | POA: Diagnosis not present

## 2014-01-27 DIAGNOSIS — M545 Low back pain, unspecified: Secondary | ICD-10-CM

## 2014-01-27 DIAGNOSIS — M79604 Pain in right leg: Secondary | ICD-10-CM

## 2014-01-27 DIAGNOSIS — M4316 Spondylolisthesis, lumbar region: Secondary | ICD-10-CM

## 2014-01-27 DIAGNOSIS — G35D Multiple sclerosis, unspecified: Secondary | ICD-10-CM

## 2014-01-27 DIAGNOSIS — M792 Neuralgia and neuritis, unspecified: Secondary | ICD-10-CM | POA: Diagnosis not present

## 2014-01-27 DIAGNOSIS — M7062 Trochanteric bursitis, left hip: Secondary | ICD-10-CM

## 2014-01-27 DIAGNOSIS — G35 Multiple sclerosis: Secondary | ICD-10-CM

## 2014-01-27 DIAGNOSIS — Z79899 Other long term (current) drug therapy: Secondary | ICD-10-CM

## 2014-01-27 DIAGNOSIS — F418 Other specified anxiety disorders: Secondary | ICD-10-CM

## 2014-01-27 MED ORDER — METHYLPREDNISOLONE 4 MG PO KIT: PACK | ORAL | Status: DC

## 2014-01-27 MED ORDER — OXYCODONE-ACETAMINOPHEN 7.5-325 MG PO TABS: 1.0000 | ORAL_TABLET | Freq: Four times a day (QID) | ORAL | Status: DC | PRN

## 2014-01-27 NOTE — Progress Notes (Signed)
Subjective:    Patient ID: Mariah Morris, female    DOB: [birth year], 43 y.o.   MRN: [id number]  HPI: [name] is a 43 year old female who returns for follow up for chronic pain and medication refill. She says her pain is located in her lower back and bilateral lower extremities. She rates her pain 7. Her current exercise regime is walking and performing stretching exercises.  She has been having increase intensity of pain due to the weather, we will increase her tablets this month, she is aware we will decrease tablets in January 2016 and verbalizes understanding. She hasn't been taking gabapentin as prescribed due to drowsiness, she has been encouraged to take 300 mg Gabapentin in the afternoon while her twins are sleeping to see if this will help her neuropathy pain and 600 mg HS. She verbalizes understanding.  Pain Inventory Average Pain 7 Pain Right Now 7 My pain is constant  In the last 24 hours, has pain interfered with the following? General activity 5 Relation with others 5 Enjoyment of life 5 What TIME of day is your pain at its worst? evening and night Sleep (in general) Fair  Pain is worse with: walking, bending, sitting, standing and some activites Pain improves with: rest, heat/ice and medication Relief from Meds: 7  Mobility walk without assistance  Function disabled: date disabled .  Neuro/Psych weakness numbness trouble walking spasms confusion anxiety  Prior Studies Any changes since last visit?  no  Physicians involved in your care Any changes since last visit?  no   Family History  Problem Relation Age of Onset  . Hypertension Mother   . Diabetes Father   . Hypertension Father    History   Social History  . Marital Status: Single    Spouse Name: N/A    Number of Children: N/A  . Years of Education: N/A   Social History Main Topics  . Smoking status: Never Smoker   . Smokeless tobacco: Never Used  . Alcohol Use: None    . Drug Use: None  . Sexual Activity: None   Other Topics Concern  . None   Social History Narrative   Past Surgical History  Procedure Laterality Date  . Ovarian cyst removal    . Cesarean section      x2   Past Medical History  Diagnosis Date  . Neuromuscular disorder 2011    MS  . Hypertension   . GERD (gastroesophageal reflux disease)   . Gestational diabetes    BP 142/78 mmHg  Pulse 80  Resp 14  SpO2 98%  Opioid Risk Score:   Fall Risk Score: Low Fall Risk (0-5 points)  Review of Systems  HENT: Negative.   Eyes: Negative.   Respiratory: Negative.   Cardiovascular: Negative.   Gastrointestinal: Negative.   Endocrine: Negative.   Genitourinary: Negative.   Musculoskeletal: Positive for myalgias, back pain and neck pain.  Skin: Negative.   Allergic/Immunologic: Negative.   Neurological: Positive for weakness and numbness.       Trouble walking, spasms  Hematological: Negative.   Psychiatric/Behavioral: The patient is nervous/anxious.        Objective:   Physical Exam  Constitutional: She is oriented to person, place, and time. She appears well-developed and well-nourished.  HENT:  Head: Normocephalic and atraumatic.  Neck: Normal range of motion. Neck supple.  Cardiovascular: Normal rate and regular rhythm.   Pulmonary/Chest: Effort normal and breath sounds normal.  Musculoskeletal:  Normal Muscle  Bulk and Muscle Testing Reveals: Upper extremities: Full ROM and Muscle strength 5/5 Bilateral AC Joint Tenderness Bilateral Greater Trochanteric tenderness Lower extremities: Full ROM and Muscle strength 5/5 Bilateral Lower extremities Flexion Produces Pain into Lower extremities. Arises from chair with slight difficulty Antalgic gait  Neurological: She is alert and oriented to person, place, and time.  Skin: Skin is warm and dry.  Psychiatric: She has a normal mood and affect.  Nursing note and vitals reviewed.         Assessment & Plan:  1.  Multiple Sclerosis: Neurology Following: Dr. Sherryll BurgerShah  2. Low back pain: Continue Current exercise Regime. Continue using heat therapy.  Refilled: oxyCODONE 7.5/325 mg one tablet every 6 hours as need for pain Increased to  #110. This Month Only. 3. Muscle Spasms: Continue Baclofen 4. Depression/anxiety: Continue Current Medication Regime.  6. Morbid obesity: Continue with weight Loss and Healthy Living Regimen. 7. Bilateral Greater Trochanteric Bursitis: RX: Medrol Dose Pak 15 minutes of face to face patient care time was spent during this visit. All questions were encouraged and answered.  F/U in 1 month

## 2014-02-26 ENCOUNTER — Encounter: Payer: Medicare HMO | Attending: Physical Medicine & Rehabilitation | Admitting: Registered Nurse

## 2014-02-26 ENCOUNTER — Encounter: Payer: Self-pay | Admitting: Registered Nurse

## 2014-02-26 VITALS — BP 140/70 | HR 107 | Resp 14

## 2014-02-26 DIAGNOSIS — G35 Multiple sclerosis: Secondary | ICD-10-CM | POA: Insufficient documentation

## 2014-02-26 DIAGNOSIS — M4316 Spondylolisthesis, lumbar region: Secondary | ICD-10-CM | POA: Diagnosis present

## 2014-02-26 DIAGNOSIS — M545 Low back pain: Secondary | ICD-10-CM | POA: Diagnosis present

## 2014-02-26 DIAGNOSIS — Z5181 Encounter for therapeutic drug level monitoring: Secondary | ICD-10-CM

## 2014-02-26 DIAGNOSIS — M79604 Pain in right leg: Secondary | ICD-10-CM

## 2014-02-26 DIAGNOSIS — M792 Neuralgia and neuritis, unspecified: Secondary | ICD-10-CM | POA: Diagnosis present

## 2014-02-26 DIAGNOSIS — Z79899 Other long term (current) drug therapy: Secondary | ICD-10-CM

## 2014-02-26 MED ORDER — OXYCODONE-ACETAMINOPHEN 7.5-325 MG PO TABS: 1.0000 | ORAL_TABLET | Freq: Four times a day (QID) | ORAL | Status: DC | PRN

## 2014-02-26 NOTE — Progress Notes (Signed)
Subjective:    Patient ID: Mariah Morris, female    DOB: 1970/04/27, 44 y.o.   MRN: [id number]  HPI:[name] is a 44 year old female who returns for follow up for chronic pain and medication refill. She says her pain is located in her lower back and bilateral lower extremities. She rates her pain 7. Her current exercise regime is walking and performing stretching exercises. She states the medrol dose pak helped tremendously, she was more mobile.  Pain Inventory Average Pain 7 Pain Right Now 7 My pain is constant, stabbing and aching  In the last 24 hours, has pain interfered with the following? General activity 5 Relation with others 5 Enjoyment of life 5 What TIME of day is your pain at its worst? evening, night Sleep (in general) Fair  Pain is worse with: walking, bending and standing Pain improves with: rest, heat/ice and medication Relief from Meds: 8  Mobility walk without assistance Do you have any goals in this area?  no  Function Do you have any goals in this area?  no  Neuro/Psych trouble walking spasms confusion depression anxiety  Prior Studies Any changes since last visit?  no  Physicians involved in your care Any changes since last visit?  no   Family History  Problem Relation Age of Onset  . Hypertension Mother   . Diabetes Father   . Hypertension Father    History   Social History  . Marital Status: Single    Spouse Name: N/A    Number of Children: N/A  . Years of Education: N/A   Social History Main Topics  . Smoking status: Never Smoker   . Smokeless tobacco: Never Used  . Alcohol Use: None  . Drug Use: None  . Sexual Activity: None   Other Topics Concern  . None   Social History Narrative   Past Surgical History  Procedure Laterality Date  . Ovarian cyst removal    . Cesarean section      x2   Past Medical History  Diagnosis Date  . Neuromuscular disorder 2011    MS  . Hypertension   . GERD  (gastroesophageal reflux disease)   . Gestational diabetes    BP 140/70 mmHg  Pulse 107  Resp 14  SpO2 97%  Opioid Risk Score:   Fall Risk Score:  (pt declined pamphlet during todays visit)  Review of Systems  Musculoskeletal: Positive for gait problem.  Neurological:       Spasms   Psychiatric/Behavioral: Positive for confusion and dysphoric mood. The patient is nervous/anxious.   All other systems reviewed and are negative.      Objective:   Physical Exam  Constitutional: She is oriented to person, place, and time. She appears well-developed and well-nourished.  HENT:  Head: Normocephalic and atraumatic.  Neck: Normal range of motion. Neck supple.  Cardiovascular: Normal rate and regular rhythm.   Pulmonary/Chest: Effort normal and breath sounds normal.  Musculoskeletal:  Normal Muscle Bulk and Muscle Testing Reveals: Upper Extremities: Full ROM and Muscle Strength 5/5 Lumbar Paraspinal Tenderness: L-3-L-5 Lower Extremities: Full ROM and Muscle Strength 5/5 Right Lower Extremities Flexion Produces Pain into Lower Extremity Arises from chair with ease Narrow Based Gait  Neurological: She is alert and oriented to person, place, and time.  Skin: Skin is warm and dry.  Psychiatric: She has a normal mood and affect.  Nursing note and vitals reviewed.         Assessment & Plan:  1. Multiple Sclerosis: Neurology Following: Dr. Sherryll Burger  2. Low back pain: Continue Current exercise Regime. Continue using heat therapy.  Refilled: oxyCODONE 7.5/325 mg one tablet every 6 hours as need for pain #100. 3. Muscle Spasms: Continue Baclofen 4. Depression/anxiety: Continue Current Medication Regime.  6. Morbid obesity: Continue with weight Loss and Healthy Living Regimen. 7. Bilateral Greater Trochanteric Bursitis: No complaints Today.  15 minutes of face to face patient care time was spent during this visit. All questions were encouraged and answered.   F/U in 1 month

## 2014-03-30 ENCOUNTER — Encounter: Payer: Medicare HMO | Attending: Physical Medicine & Rehabilitation | Admitting: Registered Nurse

## 2014-03-30 ENCOUNTER — Encounter: Payer: Self-pay | Admitting: Registered Nurse

## 2014-03-30 ENCOUNTER — Other Ambulatory Visit: Payer: Self-pay | Admitting: Registered Nurse

## 2014-03-30 VITALS — BP 136/70 | HR 100 | Resp 14

## 2014-03-30 DIAGNOSIS — M4316 Spondylolisthesis, lumbar region: Secondary | ICD-10-CM | POA: Diagnosis present

## 2014-03-30 DIAGNOSIS — M792 Neuralgia and neuritis, unspecified: Secondary | ICD-10-CM | POA: Insufficient documentation

## 2014-03-30 DIAGNOSIS — M79604 Pain in right leg: Secondary | ICD-10-CM

## 2014-03-30 DIAGNOSIS — G35 Multiple sclerosis: Secondary | ICD-10-CM | POA: Diagnosis present

## 2014-03-30 DIAGNOSIS — M545 Low back pain: Secondary | ICD-10-CM | POA: Insufficient documentation

## 2014-03-30 DIAGNOSIS — Z79899 Other long term (current) drug therapy: Secondary | ICD-10-CM

## 2014-03-30 DIAGNOSIS — Z5181 Encounter for therapeutic drug level monitoring: Secondary | ICD-10-CM

## 2014-03-30 DIAGNOSIS — G894 Chronic pain syndrome: Secondary | ICD-10-CM

## 2014-03-30 MED ORDER — CETIRIZINE HCL 10 MG PO TABS: 10.0000 mg | ORAL_TABLET | Freq: Every day | ORAL | Status: DC

## 2014-03-30 MED ORDER — LIDOCAINE 5 % EX OINT: TOPICAL_OINTMENT | CUTANEOUS | Status: DC | PRN

## 2014-03-30 MED ORDER — OXYCODONE-ACETAMINOPHEN 7.5-325 MG PO TABS: 1.0000 | ORAL_TABLET | Freq: Four times a day (QID) | ORAL | Status: DC | PRN

## 2014-03-30 NOTE — Progress Notes (Signed)
Subjective:    Patient ID: Mariah Morris, female    DOB: 07-23-1970, 44 y.o.   MRN: [id number]  HPI: [name] is a 44 year old female who returns for follow up for chronic pain and medication refill. She says her pain is located in her bilateral shoulders and  lower back. She rates her pain 7. Her current exercise regime is walking and performing stretching exercises.  Pain Inventory Average Pain 6 Pain Right Now 7 My pain is constant, burning, dull, tingling and aching  In the last 24 hours, has pain interfered with the following? General activity 5 Relation with others 5 Enjoyment of life 5 What TIME of day is your pain at its worst? daytime and night Sleep (in general) Poor  Pain is worse with: walking, bending, sitting, standing and some activites Pain improves with: rest and medication Relief from Meds: 4  Mobility walk without assistance how many minutes can you walk? 5 ability to climb steps?  no do you drive?  yes  Function disabled: date disabled .  Neuro/Psych weakness trouble walking spasms depression  Prior Studies Any changes since last visit?  no  Physicians involved in your care Any changes since last visit?  no   Family History  Problem Relation Age of Onset  . Hypertension Mother   . Diabetes Father   . Hypertension Father    History   Social History  . Marital Status: Single    Spouse Name: N/A  . Number of Children: N/A  . Years of Education: N/A   Social History Main Topics  . Smoking status: Never Smoker   . Smokeless tobacco: Never Used  . Alcohol Use: Not on file  . Drug Use: Not on file  . Sexual Activity: Not on file   Other Topics Concern  . None   Social History Narrative   Past Surgical History  Procedure Laterality Date  . Ovarian cyst removal    . Cesarean section      x2   Past Medical History  Diagnosis Date  . Neuromuscular disorder 2011    MS  . Hypertension   . GERD (gastroesophageal  reflux disease)   . Gestational diabetes    BP 136/70 mmHg  Pulse 100  Resp 14  SpO2 98%  Opioid Risk Score:   Fall Risk Score: Low Fall Risk (0-5 points)  Review of Systems  HENT: Negative.   Eyes: Negative.   Respiratory: Negative.   Cardiovascular: Negative.   Gastrointestinal: Negative.   Endocrine: Negative.   Genitourinary: Negative.   Musculoskeletal: Positive for myalgias, back pain and arthralgias.  Skin: Negative.   Allergic/Immunologic: Negative.   Neurological: Positive for weakness.  Hematological: Negative.   Psychiatric/Behavioral: Positive for decreased concentration.       Objective:   Physical Exam  Constitutional: She is oriented to person, place, and time. She appears well-developed and well-nourished.  HENT:  Head: Normocephalic and atraumatic.  Neck: Normal range of motion. Neck supple.  Cardiovascular: Normal rate and regular rhythm.   Pulmonary/Chest: Effort normal and breath sounds normal.  Musculoskeletal:  Normal Muscle bulk and Muscle Testing Reveals: Upper Extremities: Full ROM and Muscle Strength 5/5 Thoracic Tightness: T-1- T-3 Lower extremities: Full ROM and Muscle Strength 5/5 Lower Extremities Flexion Produces pain anteriorly Arises from chair with ease Narrow Based Gait   Neurological: She is alert and oriented to person, place, and time.  Skin: Skin is warm and dry.  Psychiatric: She has a normal  mood and affect.  Nursing note and vitals reviewed.         Assessment & Plan:  1. Multiple Sclerosis: Neurology Following: Dr. Sherryll Burger  2. Low back pain: Continue Current exercise Regime. Continue using heat therapy.  Refilled: oxyCODONE 7.5/325 mg one tablet every 6 hours as need for pain #100. 3. Muscle Spasms: Continue Baclofen 4. Depression/anxiety: Continue Current Medication Regime.  6. Morbid obesity:Encouraged to continue with weight Loss and Healthy Living Regimen.  15 minutes of face to face patient care time was  spent during this visit. All questions were encouraged and answered.   F/U in 1 month

## 2014-03-31 LAB — PMP ALCOHOL METABOLITE (ETG): Ethyl Glucuronide (EtG): NEGATIVE ng/mL

## 2014-04-03 LAB — BENZODIAZEPINES (GC/LC/MS), URINE
Alprazolam metabolite (GC/LC/MS), ur confirm: 731 ng/mL (ref <25)
Clonazepam metabolite (GC/LC/MS), ur confirm: NEGATIVE ng/mL (ref <25)
Flurazepam metabolite (GC/LC/MS), ur confirm: NEGATIVE ng/mL (ref <50)
Lorazepam (GC/LC/MS), ur confirm: NEGATIVE ng/mL (ref <50)
Midazolam (GC/LC/MS), ur confirm: NEGATIVE ng/mL (ref <50)
Nordiazepam (GC/LC/MS), ur confirm: NEGATIVE ng/mL (ref <50)
OXAZEPAMU: NEGATIVE ng/mL (ref <50)
Temazepam (GC/LC/MS), ur confirm: NEGATIVE ng/mL (ref <50)
Triazolam metabolite (GC/LC/MS), ur confirm: NEGATIVE ng/mL (ref <50)

## 2014-04-03 LAB — OXYCODONE, URINE (LC/MS-MS)
NOROXYCODONE, UR: 467 ng/mL (ref <50)
Oxycodone, ur: 280 ng/mL (ref <50)
Oxymorphone: 256 ng/mL (ref <50)

## 2014-04-04 LAB — PRESCRIPTION MONITORING PROFILE (SOLSTAS)
Amphetamine/Meth: NEGATIVE ng/mL
Barbiturate Screen, Urine: NEGATIVE ng/mL
Buprenorphine, Urine: NEGATIVE ng/mL
Cannabinoid Scrn, Ur: NEGATIVE ng/mL
Carisoprodol, Urine: NEGATIVE ng/mL
Cocaine Metabolites: NEGATIVE ng/mL
Creatinine, Urine: 194.59 mg/dL (ref >20.0)
FENTANYL URINE: NEGATIVE ng/mL
MDMA URINE: NEGATIVE ng/mL
Meperidine, Ur: NEGATIVE ng/mL
Methadone Screen, Urine: NEGATIVE ng/mL
Nitrites, Initial: NEGATIVE ug/mL
Opiate Screen, Urine: NEGATIVE ng/mL
PH URINE, INITIAL: 5.7 pH (ref 4.5–8.9)
PROPOXYPHENE: NEGATIVE ng/mL
Tapentadol, urine: NEGATIVE ng/mL
Tramadol Scrn, Ur: NEGATIVE ng/mL
ZOLPIDEM, URINE: NEGATIVE ng/mL

## 2014-04-17 NOTE — Progress Notes (Signed)
Urine drug screen for this encounter is consistent for prescribed medication 

## 2014-04-28 ENCOUNTER — Encounter: Payer: Self-pay | Admitting: Registered Nurse

## 2014-04-28 ENCOUNTER — Encounter: Payer: Medicare HMO | Attending: Physical Medicine & Rehabilitation | Admitting: Registered Nurse

## 2014-04-28 VITALS — BP 146/74 | HR 89 | Resp 14

## 2014-04-28 DIAGNOSIS — M545 Low back pain: Secondary | ICD-10-CM | POA: Insufficient documentation

## 2014-04-28 DIAGNOSIS — Z5181 Encounter for therapeutic drug level monitoring: Secondary | ICD-10-CM

## 2014-04-28 DIAGNOSIS — M792 Neuralgia and neuritis, unspecified: Secondary | ICD-10-CM | POA: Insufficient documentation

## 2014-04-28 DIAGNOSIS — G35 Multiple sclerosis: Secondary | ICD-10-CM | POA: Diagnosis present

## 2014-04-28 DIAGNOSIS — M79604 Pain in right leg: Secondary | ICD-10-CM

## 2014-04-28 DIAGNOSIS — M4316 Spondylolisthesis, lumbar region: Secondary | ICD-10-CM | POA: Diagnosis present

## 2014-04-28 DIAGNOSIS — M62838 Other muscle spasm: Secondary | ICD-10-CM

## 2014-04-28 DIAGNOSIS — Z79899 Other long term (current) drug therapy: Secondary | ICD-10-CM

## 2014-04-28 DIAGNOSIS — G894 Chronic pain syndrome: Secondary | ICD-10-CM

## 2014-04-28 MED ORDER — OXYCODONE-ACETAMINOPHEN 7.5-325 MG PO TABS: 1.0000 | ORAL_TABLET | Freq: Four times a day (QID) | ORAL | Status: DC | PRN

## 2014-04-28 NOTE — Progress Notes (Signed)
Subjective:    Patient ID: Mariah Morris, female    DOB: April 11, 1970, 44 y.o.   MRN: [id number]  HPI: [name] is a 44 year old female who returns for follow up for chronic pain and medication refill. She says her pain is located in her bilateral shoulders, entire back and lower extremities. Also experiencing increase frequency of muscle spasms will increase baclofen.She rates her pain 7. Her current exercise regime is walking and performing stretching exercises. Encouraged to increase her activity as tolerated she verbalizes understanding.   Pain Inventory Average Pain 7 Pain Right Now 7 My pain is constant, sharp, burning, dull, stabbing, tingling and aching  In the last 24 hours, has pain interfered with the following? General activity 5 Relation with others 5 Enjoyment of life 5 What TIME of day is your pain at its worst? evening, night Sleep (in general) Fair  Pain is worse with: walking, bending, sitting and inactivity Pain improves with: rest, therapy/exercise and medication Relief from Meds: 7  Mobility walk without assistance Do you have any goals in this area?  no  Function Do you have any goals in this area?  no  Neuro/Psych trouble walking spasms confusion anxiety  Prior Studies Any changes since last visit?  no  Physicians involved in your care Any changes since last visit?  no   Family History  Problem Relation Age of Onset  . Hypertension Mother   . Diabetes Father   . Hypertension Father    History   Social History  . Marital Status: Single    Spouse Name: N/A  . Number of Children: N/A  . Years of Education: N/A   Social History Main Topics  . Smoking status: Never Smoker   . Smokeless tobacco: Never Used  . Alcohol Use: Not on file  . Drug Use: Not on file  . Sexual Activity: Not on file   Other Topics Concern  . None   Social History Narrative   Past Surgical History  Procedure Laterality Date  . Ovarian cyst  removal    . Cesarean section      x2   Past Medical History  Diagnosis Date  . Neuromuscular disorder 2011    MS  . Hypertension   . GERD (gastroesophageal reflux disease)   . Gestational diabetes    BP 146/74 mmHg  Pulse 89  Resp 14  SpO2 97%  Opioid Risk Score:   Fall Risk Score: Low Fall Risk (0-5 points) (patient declined pamphlet during todays visit)`1  Depression screen PHQ 2/9  Depression screen PHQ 2/9 04/28/2014  Decreased Interest 1  Down, Depressed, Hopeless 1  PHQ - 2 Score 2  Altered sleeping 2  Tired, decreased energy 3  Change in appetite 3  Feeling bad or failure about yourself  0  Trouble concentrating 3  Moving slowly or fidgety/restless 0  Suicidal thoughts 0  PHQ-9 Score 13     Review of Systems  Musculoskeletal: Positive for gait problem.  Neurological:       Spasms   Psychiatric/Behavioral: Positive for confusion. The patient is nervous/anxious.   All other systems reviewed and are negative.      Objective:   Physical Exam  Constitutional: She is oriented to person, place, and time. She appears well-developed and well-nourished.  Morbid Obesity  HENT:  Head: Normocephalic and atraumatic.  Neck: Normal range of motion. Neck supple.  Cardiovascular: Normal rate and regular rhythm.   Pulmonary/Chest: Effort normal and breath sounds  normal.  Musculoskeletal:  Normal Muscle Bulk and Muscle Testing Reveals: Upper Extremities: Full ROM and Muscle Strength 5/5 Thoracic and Lumbar Hypersensitivity Lower Extremities: Decreased ROM  And Muscle Strength 4/5 Arises from chair with ease Antalgic Gait  Neurological: She is alert and oriented to person, place, and time.  Skin: Skin is warm and dry.  Psychiatric: She has a normal mood and affect.  Nursing note and vitals reviewed.         Assessment & Plan:  1. Multiple Sclerosis: Neurology Following: Dr. Sherryll Burger  2. Low back pain: Continue Current exercise Regime. Continue using heat  therapy.  Refilled: oxyCODONE 7.5/325 mg one tablet every 6 hours as need for pain #100. 3. Muscle Spasms: Increase Baclofen. Informed to increase when someone is in the home with her to watch the twins in case she becomes lethargic. She verbalizes understanding 4. Depression/anxiety: Continue Current Medication Regime.  5. Morbid obesity:Encouraged to continue with weight Loss and Healthy Living Regimen. 6. Insomnia: Continue Ambien and Try  Sleepy-time Tea. 15 minutes of face to face patient care time was spent during this visit. All questions were encouraged and answered.   F/U in 1 month

## 2014-04-28 NOTE — Patient Instructions (Addendum)
For Insomnia: Try sleepytime tea around 1 hour before bed.   Muscle Spasms: Increase Baclofen to 11/2 to two tablets. Try this when someone is in the home with you in case it makes you lethargic. Call me at the end of week. [phone]`

## 2014-05-27 ENCOUNTER — Other Ambulatory Visit: Payer: Self-pay | Admitting: Physical Medicine & Rehabilitation

## 2014-05-27 ENCOUNTER — Encounter: Payer: Self-pay | Admitting: Physical Medicine & Rehabilitation

## 2014-05-27 ENCOUNTER — Encounter: Payer: Medicare HMO | Attending: Physical Medicine & Rehabilitation | Admitting: Physical Medicine & Rehabilitation

## 2014-05-27 VITALS — BP 152/83 | HR 79 | Resp 14

## 2014-05-27 DIAGNOSIS — M545 Low back pain, unspecified: Secondary | ICD-10-CM

## 2014-05-27 DIAGNOSIS — Z5181 Encounter for therapeutic drug level monitoring: Secondary | ICD-10-CM | POA: Diagnosis not present

## 2014-05-27 DIAGNOSIS — G894 Chronic pain syndrome: Secondary | ICD-10-CM

## 2014-05-27 DIAGNOSIS — M4316 Spondylolisthesis, lumbar region: Secondary | ICD-10-CM | POA: Diagnosis not present

## 2014-05-27 DIAGNOSIS — Z79899 Other long term (current) drug therapy: Secondary | ICD-10-CM

## 2014-05-27 DIAGNOSIS — M792 Neuralgia and neuritis, unspecified: Secondary | ICD-10-CM | POA: Insufficient documentation

## 2014-05-27 DIAGNOSIS — G35 Multiple sclerosis: Secondary | ICD-10-CM | POA: Diagnosis present

## 2014-05-27 MED ORDER — OXYCODONE-ACETAMINOPHEN 7.5-325 MG PO TABS: 1.0000 | ORAL_TABLET | Freq: Four times a day (QID) | ORAL | Status: DC | PRN

## 2014-05-27 MED ORDER — BACLOFEN 10 MG PO TABS: 10.0000 mg | ORAL_TABLET | Freq: Three times a day (TID) | ORAL | Status: DC

## 2014-05-27 NOTE — Patient Instructions (Signed)
PLEASE CALL ME WITH ANY PROBLEMS OR QUESTIONS (#[phone]).      

## 2014-05-27 NOTE — Progress Notes (Signed)
Subjective:    Patient ID: Mariah Morris, female    DOB: [birth year], 44 y.o.   MRN: [id number]  HPI   Mariah Morris is here in follow up of her chronic pain as related to her back and MS. She continues to struggle with her weight.  She is trying to eat and drink better but still struggles with choices. Her exercise is minimal.   For exercise she walks a "little bit". She walks for about 6-8 minutes but usually once per day.   Her sleep, fortunately, has been better.   She is frustrated by where she's at physically and feels "old"  Pain Inventory Average Pain 7 Pain Right Now 7 My pain is constant, sharp, burning, dull, stabbing, tingling and aching  In the last 24 hours, has pain interfered with the following? General activity 5 Relation with others 5 Enjoyment of life 5 What TIME of day is your pain at its worst? evening, night Sleep (in general) Fair  Pain is worse with: walking, standing and some activites Pain improves with: rest, therapy/exercise and medication Relief from Meds: 7  Mobility Do you have any goals in this area?  no  Function Do you have any goals in this area?  no  Neuro/Psych numbness trouble walking spasms confusion anxiety  Prior Studies Any changes since last visit?  no  Physicians involved in your care Any changes since last visit?  no   Family History  Problem Relation Age of Onset  . Hypertension Mother   . Diabetes Father   . Hypertension Father    History   Social History  . Marital Status: Single    Spouse Name: N/A  . Number of Children: N/A  . Years of Education: N/A   Social History Main Topics  . Smoking status: Never Smoker   . Smokeless tobacco: Never Used  . Alcohol Use: Not on file  . Drug Use: Not on file  . Sexual Activity: Not on file   Other Topics Concern  . None   Social History Narrative   Past Surgical History  Procedure Laterality Date  . Ovarian cyst removal    . Cesarean section     x2   Past Medical History  Diagnosis Date  . Neuromuscular disorder 2011    MS  . Hypertension   . GERD (gastroesophageal reflux disease)   . Gestational diabetes    BP 152/83 mmHg  Pulse 79  Resp 14  SpO2 97%  Opioid Risk Score:   Fall Risk Score: Low Fall Risk (0-5 points)`1  Depression screen PHQ 2/9  Depression screen PHQ 2/9 04/28/2014  Decreased Interest 1  Down, Depressed, Hopeless 1  PHQ - 2 Score 2  Altered sleeping 2  Tired, decreased energy 3  Change in appetite 3  Feeling bad or failure about yourself  0  Trouble concentrating 3  Moving slowly or fidgety/restless 0  Suicidal thoughts 0  PHQ-9 Score 13     Review of Systems  Musculoskeletal: Positive for gait problem.  Neurological:       Spasms  Psychiatric/Behavioral: Positive for confusion. The patient is nervous/anxious.   All other systems reviewed and are negative.      Objective:   Physical Exam   Constitutional: She is oriented to person, place, and time. She appears well-developed and well-nourished. IN distress Morbidly obese  HENT:  Head: Normocephalic.  Neck: Neck supple.  Musculoskeletal: She exhibits tenderness. In all limbs (no allodynia but hypersensitive), low back  tender also. Neurological: She is alert and oriented to person, place, and time.  Skin: Skin is warm and dry.  Psychiatric: She has a normal mood and affect.  Symmetric normal motor tone is noted throughout. Normal muscle bulk. Muscle testing reveals 4-5/5 muscle strength of the upper extremity, and 4- to 5/5 of the lower extremity. (proximally limited due to pain) limited range of motion in upper and lower extremities.  Sensory is decreased to light touch, on the right.  DTR in the upper and lower extremity are present and symmetric 2+ except right patella 3+. No clonus is noted.  She struggles with extension---cannot extend past -20 ext.  She flexed to 60 degrees almost today. Low back very tender to palpation. Gait  wide based.  Neutral standing posture was improved and more erect.    Assessment:  1. MS-relaxing remitting  2. Low back pain, with documented Grade 1 spondylolisthesis of L4 on L5,  With maladaptive posture  3. Right sided pain, neck, shoulder back and leg in a diffuse, non radicular pattern, most likely MS related   4. Depression/anxiety  5. Morbid obesity   Plan:  1. Continue with diet and exercise measures---reviewed these.  She needs to make some wholesale changes 2. For leg symptoms, Increase gabapentin to 600mg  TID (2 doses in the evening --dinner and bedtime--- to help with sleep)  3. Reviewed exercise program, hip flexor stretching, etc.   4. Continue baclofen TID for spasms along with zanaflex   5. Percocet to 7.5 q8 prn. #100  6. Follow up with NP in About a month. 30 minutes of face to face patient care time were spent during this visit. All questions were encouraged and answered.

## 2014-05-28 LAB — PMP ALCOHOL METABOLITE (ETG): Ethyl Glucuronide (EtG): NEGATIVE ng/mL

## 2014-06-01 LAB — OXYCODONE, URINE (LC/MS-MS)
NOROXYCODONE, UR: 1550 ng/mL (ref <50)
OXYCODONE, UR: 394 ng/mL (ref <50)
OXYMORPHONE, URINE: 417 ng/mL (ref <50)

## 2014-06-01 LAB — BENZODIAZEPINES (GC/LC/MS), URINE
ALPRAZOLAMU: 685 ng/mL (ref <25)
CLONAZEPAU: NEGATIVE ng/mL (ref <25)
Flurazepam metabolite (GC/LC/MS), ur confirm: NEGATIVE ng/mL (ref <50)
Lorazepam (GC/LC/MS), ur confirm: NEGATIVE ng/mL (ref <50)
Midazolam (GC/LC/MS), ur confirm: NEGATIVE ng/mL (ref <50)
Nordiazepam (GC/LC/MS), ur confirm: NEGATIVE ng/mL (ref <50)
Oxazepam (GC/LC/MS), ur confirm: NEGATIVE ng/mL (ref <50)
Temazepam (GC/LC/MS), ur confirm: NEGATIVE ng/mL (ref <50)
Triazolam metabolite (GC/LC/MS), ur confirm: NEGATIVE ng/mL (ref <50)

## 2014-06-02 LAB — PRESCRIPTION MONITORING PROFILE (SOLSTAS)
Amphetamine/Meth: NEGATIVE ng/mL
Barbiturate Screen, Urine: NEGATIVE ng/mL
Buprenorphine, Urine: NEGATIVE ng/mL
CREATININE, URINE: 476.22 mg/dL (ref >20.0)
Cannabinoid Scrn, Ur: NEGATIVE ng/mL
Carisoprodol, Urine: NEGATIVE ng/mL
Cocaine Metabolites: NEGATIVE ng/mL
FENTANYL URINE: NEGATIVE ng/mL
MDMA URINE: NEGATIVE ng/mL
Meperidine, Ur: NEGATIVE ng/mL
Methadone Screen, Urine: NEGATIVE ng/mL
NITRITES URINE, INITIAL: NEGATIVE ug/mL
Opiate Screen, Urine: NEGATIVE ng/mL
Propoxyphene: NEGATIVE ng/mL
Tapentadol, urine: NEGATIVE ng/mL
Tramadol Scrn, Ur: NEGATIVE ng/mL
ZOLPIDEM, URINE: NEGATIVE ng/mL
pH, Initial: 5.9 pH (ref 4.5–8.9)

## 2014-06-17 NOTE — Progress Notes (Signed)
Urine drug screen for this encounter is consistent for prescribed medication 

## 2014-06-26 ENCOUNTER — Encounter: Payer: Medicare HMO | Attending: Physical Medicine & Rehabilitation | Admitting: Registered Nurse

## 2014-06-26 ENCOUNTER — Encounter: Payer: Self-pay | Admitting: Registered Nurse

## 2014-06-26 VITALS — BP 124/64 | HR 92 | Resp 14

## 2014-06-26 DIAGNOSIS — M79604 Pain in right leg: Secondary | ICD-10-CM

## 2014-06-26 DIAGNOSIS — M545 Low back pain, unspecified: Secondary | ICD-10-CM

## 2014-06-26 DIAGNOSIS — M792 Neuralgia and neuritis, unspecified: Secondary | ICD-10-CM | POA: Diagnosis not present

## 2014-06-26 DIAGNOSIS — Z79899 Other long term (current) drug therapy: Secondary | ICD-10-CM

## 2014-06-26 DIAGNOSIS — G894 Chronic pain syndrome: Secondary | ICD-10-CM

## 2014-06-26 DIAGNOSIS — M4316 Spondylolisthesis, lumbar region: Secondary | ICD-10-CM

## 2014-06-26 DIAGNOSIS — G35 Multiple sclerosis: Secondary | ICD-10-CM | POA: Insufficient documentation

## 2014-06-26 DIAGNOSIS — Z5181 Encounter for therapeutic drug level monitoring: Secondary | ICD-10-CM

## 2014-06-26 DIAGNOSIS — G35D Multiple sclerosis, unspecified: Secondary | ICD-10-CM

## 2014-06-26 MED ORDER — TIZANIDINE HCL 4 MG PO TABS: 4.0000 mg | ORAL_TABLET | Freq: Three times a day (TID) | ORAL | Status: DC

## 2014-06-26 MED ORDER — OXYCODONE-ACETAMINOPHEN 7.5-325 MG PO TABS: 1.0000 | ORAL_TABLET | Freq: Four times a day (QID) | ORAL | Status: DC | PRN

## 2014-06-26 NOTE — Progress Notes (Signed)
Subjective:    Patient ID: Mariah Morris, female    DOB: 1970/06/23, 44 y.o.   MRN: [id number]  HPI: [name] is a 44 year old female who returns for follow up for chronic pain and medication refill. She says her pain is located in her lower back and lower extremities.She rates her pain 7. Her current exercise regime is walking and performing stretching exercises. Encouraged to increase her activity as tolerated she verbalizes understanding.   Pain Inventory Average Pain 7 Pain Right Now 7 My pain is constant, sharp, stabbing and aching  In the last 24 hours, has pain interfered with the following? General activity 5 Relation with others 5 Enjoyment of life 5 What TIME of day is your pain at its worst? evening and night Sleep (in general) Fair  Pain is worse with: walking, bending, sitting, inactivity, standing and some activites Pain improves with: rest, heat/ice and medication Relief from Meds: 7  Mobility walk without assistance how many minutes can you walk? 5 ability to climb steps?  no do you drive?  yes  Function disabled: date disabled .  Neuro/Psych weakness trouble walking spasms depression anxiety  Prior Studies Any changes since last visit?  no  Physicians involved in your care Any changes since last visit?  no   Family History  Problem Relation Age of Onset  . Hypertension Mother   . Diabetes Father   . Hypertension Father    History   Social History  . Marital Status: Single    Spouse Name: N/A  . Number of Children: N/A  . Years of Education: N/A   Social History Main Topics  . Smoking status: Never Smoker   . Smokeless tobacco: Never Used  . Alcohol Use: Not on file  . Drug Use: Not on file  . Sexual Activity: Not on file   Other Topics Concern  . None   Social History Narrative   Past Surgical History  Procedure Laterality Date  . Ovarian cyst removal    . Cesarean section      x2   Past Medical History    Diagnosis Date  . Neuromuscular disorder 2011    MS  . Hypertension   . GERD (gastroesophageal reflux disease)   . Gestational diabetes    BP 124/64 mmHg  Pulse 92  Resp 14  SpO2 97%  Opioid Risk Score:   Fall Risk Score: Low Fall Risk (0-5 points)`1  Depression screen PHQ 2/9  Depression screen PHQ 2/9 04/28/2014  Decreased Interest 1  Down, Depressed, Hopeless 1  PHQ - 2 Score 2  Altered sleeping 2  Tired, decreased energy 3  Change in appetite 3  Feeling bad or failure about yourself  0  Trouble concentrating 3  Moving slowly or fidgety/restless 0  Suicidal thoughts 0  PHQ-9 Score 13     Review of Systems  HENT: Negative.   Eyes: Negative.   Respiratory: Negative.   Cardiovascular: Negative.   Gastrointestinal: Negative.   Endocrine: Negative.   Genitourinary: Negative.   Musculoskeletal: Positive for myalgias, back pain and arthralgias.  Skin: Negative.   Allergic/Immunologic: Negative.   Neurological: Positive for weakness.       Trouble walking, spasms, pain in legs  Hematological: Negative.   Psychiatric/Behavioral: Positive for dysphoric mood. The patient is nervous/anxious.        Objective:   Physical Exam  Constitutional: She is oriented to person, place, and time. She appears well-developed and well-nourished.  HENT:  Head: Normocephalic and atraumatic.  Neck: Normal range of motion. Neck supple.  Cardiovascular: Normal rate and regular rhythm.   Pulmonary/Chest: Effort normal and breath sounds normal.  Musculoskeletal:  Normal Muscle Bulk and Muscle Testing Reveals: Upper Extremities: Full ROM and Muscle Strength 5/5 Thoracic Paraspinal Tenderness: T-6-T-9 Lumbar Paraspinal Tenderness: L-3- L-5 Lower Extremities: Full ROM and Muscle Strength 5/5 Arises from chair with ease Narrow Based Gait   Neurological: She is alert and oriented to person, place, and time.  Skin: Skin is warm and dry.  Psychiatric: She has a normal mood and affect.   Nursing note and vitals reviewed.         Assessment & Plan:  1. Multiple Sclerosis: Neurology Following: Dr. Sherryll Burger  2. Low back pain: Continue Current exercise Regime. Continue using heat therapy.  Refilled: oxyCODONE 7.5/325 mg one tablet every 6 hours as need for pain #100. 3. Muscle Spasms: Ineffective Baclofen Discontinued.  RX: Tizanidine 4 mg one tablet three times a day. 4. Depression/anxiety: Continue Current Medication Regime.  5. Morbid obesity:Encouraged to continue with weight Loss and Healthy Living Regimen. 6. Insomnia: Continue Ambien and Try Sleepy-time Tea. 15 minutes of face to face patient care time was spent during this visit. All questions were encouraged and answered.   F/U in 1 month

## 2014-07-23 ENCOUNTER — Emergency Department (HOSPITAL_COMMUNITY): Payer: Medicare HMO

## 2014-07-23 ENCOUNTER — Observation Stay (HOSPITAL_COMMUNITY)
Admission: AD | Admit: 2014-07-23 | Discharge: 2014-07-24 | Disposition: A | Discharge status: Home / Self Care | Payer: Medicare HMO | Source: Intra-hospital | Attending: Psychiatry | Admitting: Psychiatry

## 2014-07-23 ENCOUNTER — Encounter: Payer: Self-pay | Admitting: Registered Nurse

## 2014-07-23 ENCOUNTER — Encounter (HOSPITAL_COMMUNITY): Payer: Self-pay | Admitting: Emergency Medicine

## 2014-07-23 ENCOUNTER — Emergency Department (HOSPITAL_COMMUNITY)
Admission: EM | Admit: 2014-07-23 | Discharge: 2014-07-23 | Disposition: A | Discharge status: Other Acute Inpatient Hospital | Payer: Medicare HMO | Attending: Emergency Medicine | Admitting: Emergency Medicine

## 2014-07-23 ENCOUNTER — Encounter: Payer: Medicare HMO | Attending: Physical Medicine & Rehabilitation | Admitting: Registered Nurse

## 2014-07-23 VITALS — BP 140/84 | HR 96 | Resp 16

## 2014-07-23 DIAGNOSIS — M545 Low back pain, unspecified: Secondary | ICD-10-CM

## 2014-07-23 DIAGNOSIS — Z79899 Other long term (current) drug therapy: Secondary | ICD-10-CM | POA: Diagnosis not present

## 2014-07-23 DIAGNOSIS — R45851 Suicidal ideations: Secondary | ICD-10-CM | POA: Diagnosis not present

## 2014-07-23 DIAGNOSIS — G8929 Other chronic pain: Secondary | ICD-10-CM | POA: Insufficient documentation

## 2014-07-23 DIAGNOSIS — Z3202 Encounter for pregnancy test, result negative: Secondary | ICD-10-CM | POA: Diagnosis not present

## 2014-07-23 DIAGNOSIS — I10 Essential (primary) hypertension: Secondary | ICD-10-CM | POA: Insufficient documentation

## 2014-07-23 DIAGNOSIS — K219 Gastro-esophageal reflux disease without esophagitis: Secondary | ICD-10-CM | POA: Diagnosis not present

## 2014-07-23 DIAGNOSIS — F411 Generalized anxiety disorder: Secondary | ICD-10-CM | POA: Insufficient documentation

## 2014-07-23 DIAGNOSIS — Z6841 Body Mass Index (BMI) 40.0 and over, adult: Secondary | ICD-10-CM | POA: Diagnosis not present

## 2014-07-23 DIAGNOSIS — M792 Neuralgia and neuritis, unspecified: Secondary | ICD-10-CM

## 2014-07-23 DIAGNOSIS — R630 Anorexia: Secondary | ICD-10-CM | POA: Insufficient documentation

## 2014-07-23 DIAGNOSIS — G35 Multiple sclerosis: Secondary | ICD-10-CM | POA: Insufficient documentation

## 2014-07-23 DIAGNOSIS — M4316 Spondylolisthesis, lumbar region: Secondary | ICD-10-CM | POA: Diagnosis not present

## 2014-07-23 DIAGNOSIS — F418 Other specified anxiety disorders: Secondary | ICD-10-CM | POA: Diagnosis not present

## 2014-07-23 DIAGNOSIS — Z8632 Personal history of gestational diabetes: Secondary | ICD-10-CM | POA: Insufficient documentation

## 2014-07-23 DIAGNOSIS — D649 Anemia, unspecified: Secondary | ICD-10-CM | POA: Insufficient documentation

## 2014-07-23 DIAGNOSIS — Z886 Allergy status to analgesic agent status: Secondary | ICD-10-CM | POA: Diagnosis not present

## 2014-07-23 DIAGNOSIS — R079 Chest pain, unspecified: Secondary | ICD-10-CM

## 2014-07-23 DIAGNOSIS — Z950 Presence of cardiac pacemaker: Secondary | ICD-10-CM | POA: Diagnosis not present

## 2014-07-23 DIAGNOSIS — M62838 Other muscle spasm: Secondary | ICD-10-CM

## 2014-07-23 DIAGNOSIS — F329 Major depressive disorder, single episode, unspecified: Principal | ICD-10-CM | POA: Diagnosis present

## 2014-07-23 DIAGNOSIS — F1310 Sedative, hypnotic or anxiolytic abuse, uncomplicated: Secondary | ICD-10-CM | POA: Diagnosis not present

## 2014-07-23 DIAGNOSIS — G35D Multiple sclerosis, unspecified: Secondary | ICD-10-CM

## 2014-07-23 DIAGNOSIS — R0789 Other chest pain: Secondary | ICD-10-CM

## 2014-07-23 DIAGNOSIS — Z5181 Encounter for therapeutic drug level monitoring: Secondary | ICD-10-CM

## 2014-07-23 DIAGNOSIS — G894 Chronic pain syndrome: Secondary | ICD-10-CM

## 2014-07-23 HISTORY — DX: Presence of cardiac pacemaker: Z95.0

## 2014-07-23 HISTORY — DX: Unspecified convulsions: R56.9

## 2014-07-23 LAB — I-STAT TROPONIN, ED: Troponin i, poc: 0 ng/mL (ref 0.00–0.08)

## 2014-07-23 LAB — ETHANOL: Alcohol, Ethyl (B): <5 mg/dL (ref <5)

## 2014-07-23 LAB — RAPID URINE DRUG SCREEN, HOSP PERFORMED
Amphetamines: NOT DETECTED
BENZODIAZEPINES: POSITIVE — AB
Barbiturates: NOT DETECTED
Cocaine: NOT DETECTED
OPIATES: NOT DETECTED
TETRAHYDROCANNABINOL: NOT DETECTED

## 2014-07-23 LAB — COMPREHENSIVE METABOLIC PANEL
ALK PHOS: 52 U/L (ref 38–126)
ALT: 52 U/L (ref 14–54)
AST: 63 U/L — ABNORMAL HIGH (ref 15–41)
Albumin: 3.7 g/dL (ref 3.5–5.0)
Anion gap: 8 (ref 5–15)
BILIRUBIN TOTAL: 0.2 mg/dL — AB (ref 0.3–1.2)
BUN: 9 mg/dL (ref 6–20)
CHLORIDE: 100 mmol/L — AB (ref 101–111)
CO2: 28 mmol/L (ref 22–32)
Calcium: 8.6 mg/dL — ABNORMAL LOW (ref 8.9–10.3)
Creatinine, Ser: 0.62 mg/dL (ref 0.44–1.00)
GFR calc Af Amer: >60 mL/min (ref >60)
Glucose, Bld: 95 mg/dL (ref 65–99)
POTASSIUM: 3.7 mmol/L (ref 3.5–5.1)
SODIUM: 136 mmol/L (ref 135–145)
TOTAL PROTEIN: 7.6 g/dL (ref 6.5–8.1)

## 2014-07-23 LAB — CBC WITH DIFFERENTIAL/PLATELET
BASOS ABS: 0 10*3/uL (ref 0.0–0.1)
Basophils Relative: 1 % (ref 0–1)
EOS PCT: 3 % (ref 0–5)
Eosinophils Absolute: 0.2 10*3/uL (ref 0.0–0.7)
HEMATOCRIT: 34.5 % — AB (ref 36.0–46.0)
HEMOGLOBIN: 10.7 g/dL — AB (ref 12.0–15.0)
LYMPHS PCT: 53 % — AB (ref 12–46)
Lymphs Abs: 2.7 10*3/uL (ref 0.7–4.0)
MCH: 23.6 pg — AB (ref 26.0–34.0)
MCHC: 31 g/dL (ref 30.0–36.0)
MCV: 76.2 fL — AB (ref 78.0–100.0)
MONOS PCT: 8 % (ref 3–12)
Monocytes Absolute: 0.4 10*3/uL (ref 0.1–1.0)
Neutro Abs: 1.9 10*3/uL (ref 1.7–7.7)
Neutrophils Relative %: 36 % — ABNORMAL LOW (ref 43–77)
Platelets: 151 10*3/uL (ref 150–400)
RBC: 4.53 MIL/uL (ref 3.87–5.11)
RDW: 16.6 % — ABNORMAL HIGH (ref 11.5–15.5)
WBC: 5.2 10*3/uL (ref 4.0–10.5)

## 2014-07-23 LAB — GLUCOSE, CAPILLARY: GLUCOSE-CAPILLARY: 137 mg/dL — AB (ref 65–99)

## 2014-07-23 LAB — PREGNANCY, URINE: Preg Test, Ur: NEGATIVE

## 2014-07-23 MED ORDER — ALUM & MAG HYDROXIDE-SIMETH 200-200-20 MG/5ML PO SUSP
30.0000 mL | ORAL | Status: DC | PRN
  Administered 2014-07-24: 30 mL via ORAL
  Filled 2014-07-23: qty 30

## 2014-07-23 MED ORDER — INTERFERON BETA-1A 44 MCG/0.5ML ~~LOC~~ SOSY
44.0000 ug | PREFILLED_SYRINGE | SUBCUTANEOUS | Status: DC
Start: 2014-07-24 — End: 2014-07-23

## 2014-07-23 MED ORDER — OXYCODONE-ACETAMINOPHEN 7.5-325 MG PO TABS: 1.0000 | ORAL_TABLET | Freq: Four times a day (QID) | ORAL | Status: DC | PRN

## 2014-07-23 MED ORDER — LORATADINE 10 MG PO TABS
10.0000 mg | ORAL_TABLET | Freq: Every day | ORAL | Status: DC
  Administered 2014-07-24: 10 mg via ORAL
  Filled 2014-07-23: qty 1

## 2014-07-23 MED ORDER — TRAZODONE HCL 100 MG PO TABS: 100.0000 mg | ORAL_TABLET | Freq: Every evening | ORAL | Status: DC | PRN

## 2014-07-23 MED ORDER — PANTOPRAZOLE SODIUM 40 MG PO TBEC
40.0000 mg | DELAYED_RELEASE_TABLET | Freq: Every day | ORAL | Status: DC
  Administered 2014-07-24: 40 mg via ORAL
  Filled 2014-07-23: qty 1

## 2014-07-23 MED ORDER — ZOLPIDEM TARTRATE 5 MG PO TABS: 5.0000 mg | ORAL_TABLET | Freq: Every evening | ORAL | Status: DC | PRN

## 2014-07-23 MED ORDER — DULOXETINE HCL 60 MG PO CPEP
60.0000 mg | ORAL_CAPSULE | Freq: Two times a day (BID) | ORAL | Status: DC
  Filled 2014-07-23: qty 1

## 2014-07-23 MED ORDER — OXYCODONE HCL 5 MG PO TABS
2.5000 mg | ORAL_TABLET | Freq: Four times a day (QID) | ORAL | Status: DC | PRN
  Administered 2014-07-23: 2.5 mg via ORAL
  Filled 2014-07-23: qty 1

## 2014-07-23 MED ORDER — ALPRAZOLAM 1 MG PO TABS
1.0000 mg | ORAL_TABLET | Freq: Every day | ORAL | Status: DC | PRN
  Administered 2014-07-23: 1 mg via ORAL
  Filled 2014-07-23: qty 1

## 2014-07-23 MED ORDER — ACETAMINOPHEN 325 MG PO TABS: 650.0000 mg | ORAL_TABLET | Freq: Four times a day (QID) | ORAL | Status: DC | PRN

## 2014-07-23 MED ORDER — BACLOFEN 10 MG PO TABS
10.0000 mg | ORAL_TABLET | Freq: Three times a day (TID) | ORAL | Status: DC | PRN
  Filled 2014-07-23: qty 1

## 2014-07-23 MED ORDER — OXYCODONE-ACETAMINOPHEN 7.5-325 MG PO TABS
1.0000 | ORAL_TABLET | Freq: Four times a day (QID) | ORAL | Status: DC | PRN
Start: 2014-07-23 — End: 2014-08-21

## 2014-07-23 MED ORDER — OXCARBAZEPINE 300 MG PO TABS
600.0000 mg | ORAL_TABLET | Freq: Two times a day (BID) | ORAL | Status: DC
  Filled 2014-07-23: qty 2

## 2014-07-23 MED ORDER — BACLOFEN 10 MG PO TABS: 10.0000 mg | ORAL_TABLET | Freq: Three times a day (TID) | ORAL | Status: DC | PRN

## 2014-07-23 MED ORDER — ALUM & MAG HYDROXIDE-SIMETH 200-200-20 MG/5ML PO SUSP: 30.0000 mL | ORAL | Status: DC | PRN

## 2014-07-23 MED ORDER — ALPRAZOLAM 1 MG PO TABS
1.0000 mg | ORAL_TABLET | Freq: Every day | ORAL | Status: DC | PRN
  Administered 2014-07-23 – 2014-07-24 (×2): 1 mg via ORAL
  Filled 2014-07-23 (×2): qty 1

## 2014-07-23 MED ORDER — GABAPENTIN 300 MG PO CAPS
600.0000 mg | ORAL_CAPSULE | Freq: Three times a day (TID) | ORAL | Status: DC
  Administered 2014-07-24 (×2): 600 mg via ORAL
  Filled 2014-07-23 (×2): qty 2

## 2014-07-23 MED ORDER — ONDANSETRON HCL 4 MG PO TABS: 4.0000 mg | ORAL_TABLET | Freq: Three times a day (TID) | ORAL | Status: DC | PRN

## 2014-07-23 MED ORDER — ACETAMINOPHEN 325 MG PO TABS
650.0000 mg | ORAL_TABLET | ORAL | Status: DC | PRN
  Administered 2014-07-23: 650 mg via ORAL
  Filled 2014-07-23: qty 2

## 2014-07-23 MED ORDER — HYDROCHLOROTHIAZIDE 25 MG PO TABS
25.0000 mg | ORAL_TABLET | Freq: Every day | ORAL | Status: DC
  Administered 2014-07-24: 25 mg via ORAL
  Filled 2014-07-23: qty 1

## 2014-07-23 MED ORDER — FLUTICASONE PROPIONATE 50 MCG/ACT NA SUSP: 1.0000 | Freq: Every day | NASAL | Status: DC | PRN

## 2014-07-23 MED ORDER — LORAZEPAM 1 MG PO TABS: 1.0000 mg | ORAL_TABLET | Freq: Three times a day (TID) | ORAL | Status: DC | PRN

## 2014-07-23 MED ORDER — OXYCODONE-ACETAMINOPHEN 5-325 MG PO TABS
1.0000 | ORAL_TABLET | Freq: Four times a day (QID) | ORAL | Status: DC | PRN
  Administered 2014-07-23: 1 via ORAL
  Filled 2014-07-23: qty 1

## 2014-07-23 MED ORDER — INTERFERON BETA-1A 44 MCG/0.5ML ~~LOC~~ SOSY: 44.0000 ug | PREFILLED_SYRINGE | SUBCUTANEOUS | Status: DC

## 2014-07-23 MED ORDER — PANTOPRAZOLE SODIUM 40 MG PO TBEC
40.0000 mg | DELAYED_RELEASE_TABLET | Freq: Every day | ORAL | Status: DC
  Administered 2014-07-23: 40 mg via ORAL
  Filled 2014-07-23: qty 1

## 2014-07-23 MED ORDER — FLUTICASONE PROPIONATE 50 MCG/ACT NA SUSP
1.0000 | Freq: Every day | NASAL | Status: DC | PRN
  Filled 2014-07-23: qty 16

## 2014-07-23 MED ORDER — MAGNESIUM HYDROXIDE 400 MG/5ML PO SUSP
30.0000 mL | Freq: Every day | ORAL | Status: DC | PRN
  Administered 2014-07-24: 30 mL via ORAL
  Filled 2014-07-23: qty 30

## 2014-07-23 MED ORDER — GABAPENTIN 300 MG PO CAPS
600.0000 mg | ORAL_CAPSULE | Freq: Three times a day (TID) | ORAL | Status: DC
  Administered 2014-07-23: 600 mg via ORAL
  Filled 2014-07-23: qty 2

## 2014-07-23 MED ORDER — TIZANIDINE HCL 4 MG PO TABS
4.0000 mg | ORAL_TABLET | Freq: Three times a day (TID) | ORAL | Status: DC | PRN
  Filled 2014-07-23: qty 1

## 2014-07-23 MED ORDER — DULOXETINE HCL 60 MG PO CPEP
60.0000 mg | ORAL_CAPSULE | Freq: Two times a day (BID) | ORAL | Status: DC
  Administered 2014-07-24 (×2): 60 mg via ORAL
  Filled 2014-07-23 (×2): qty 1

## 2014-07-23 NOTE — Progress Notes (Signed)
[facility] INPATIENT:  Family/Significant Other Suicide Prevention Education  Suicide Prevention Education:  Education Completed; Lavoris Rogers (SON),  (name of family member/significant other) has been identified by the patient as the family member/significant other with whom the patient will be residing, and identified as the person(s) who will aid the patient in the event of a mental health crisis (suicidal ideations/suicide attempt).  With written consent from the patient, the family member/significant other has been provided the following suicide prevention education, prior to the and/or following the discharge of the patient.  The suicide prevention education provided includes the following:  Suicide risk factors  Suicide prevention and interventions  National Suicide Hotline telephone number  [facility] assessment telephone number  [facility] Emergency Assistance 911  [facility] and/or Residential Mobile Crisis Unit telephone number  Request made of family/significant other to:  Remove weapons (e.g., guns, rifles, knives), all items previously/currently identified as safety concern.    Remove drugs/medications (over-the-counter, prescriptions, illicit drugs), all items previously/currently identified as a safety concern.  The family member/significant other verbalizes understanding of the suicide prevention education information provided.  The family member/significant other agrees to remove the items of safety concern listed above.  Roselie Skinner [facility] 07/23/2014, 11:00 PM

## 2014-07-23 NOTE — ED Notes (Signed)
Patient cooperative. Reports feelings of anxiety 10/10, depression 8/10. States she has generalized pain and leg pain 8/10.  Encouragement offered. Given snack. Given Tylenol, Xanax, Neurontin, Protonix.   Q 15 safety checks continue.

## 2014-07-23 NOTE — ED Notes (Signed)
Patient here from pain clinic with complaints of pain all over and SI. Wondering what life would be like if she was not not around. Hx of MS.

## 2014-07-23 NOTE — ED Notes (Signed)
Patient is currently sleeping, vitals will be retreived at a later time. 

## 2014-07-23 NOTE — ED Notes (Signed)
Patient has been cooperative since coming to the unit.  Oriented to the unit and offered a drink.  She will be transferred to [facility] later this evening.

## 2014-07-23 NOTE — Progress Notes (Signed)
Subjective:    Patient ID: Mariah Morris, female    DOB: 1970/05/22, 44 y.o.   MRN: [id number]  HPI: [name] is a 44 year old female who returns for follow up for chronic pain and medication refill. She says her pain is located in her lower back and lower extremities. Also states she's having increase intensity of pain in her lower back and lower extremities.She rates her pain 7. Her current exercise regime is walking and performing stretching exercises. Encouraged to increase her activity as tolerated she verbalizes understanding.  Also states she is under stress, having a lot of family issues with her son's. She's tearful while speaking and states" I wonder if life would be better if I wasn't here. She admits to being depressed and doesn't have a suicidal plan. She's under the care of her psychiatrist Dr. Marguerite Olea. She states she will began counseling on Monday June 27,16. Also stated " she is having chest pain now denies SOB, Nausea or Vomiting. VSS 135/79, 91, O2 Sats 97%. EMS Called.  Pain Inventory Average Pain 7 Pain Right Now 7 My pain is constant, burning, stabbing, tingling and aching  In the last 24 hours, has pain interfered with the following? General activity 5 Relation with others 5 Enjoyment of life 5 What TIME of day is your pain at its worst? daytime evening and night Sleep (in general) Fair  Pain is worse with: walking and some activites Pain improves with: rest, heat/ice, therapy/exercise and medication Relief from Meds: 7  Mobility walk without assistance Do you have any goals in this area?  no  Function Do you have any goals in this area?  no  Neuro/Psych trouble walking spasms confusion depression anxiety  Prior Studies Any changes since last visit?  no  Physicians involved in your care Any changes since last visit?  no   Family History  Problem Relation Age of Onset  . Hypertension Mother   . Diabetes Father   . Hypertension  Father    History   Social History  . Marital Status: Single    Spouse Name: N/A  . Number of Children: N/A  . Years of Education: N/A   Social History Main Topics  . Smoking status: Never Smoker   . Smokeless tobacco: Never Used  . Alcohol Use: Not on file  . Drug Use: Not on file  . Sexual Activity: Not on file   Other Topics Concern  . None   Social History Narrative   Past Surgical History  Procedure Laterality Date  . Ovarian cyst removal    . Cesarean section      x2   Past Medical History  Diagnosis Date  . Neuromuscular disorder 2011    MS  . Hypertension   . GERD (gastroesophageal reflux disease)   . Gestational diabetes    BP 140/84 mmHg  Pulse 96  Resp 16  SpO2 96%  Opioid Risk Score:   Fall Risk Score: Low Fall Risk (0-5 points) (previously educated and given handout)`1  Depression screen PHQ 2/9  Depression screen PHQ 2/9 04/28/2014  Decreased Interest 1  Down, Depressed, Hopeless 1  PHQ - 2 Score 2  Altered sleeping 2  Tired, decreased energy 3  Change in appetite 3  Feeling bad or failure about yourself  0  Trouble concentrating 3  Moving slowly or fidgety/restless 0  Suicidal thoughts 0  PHQ-9 Score 13     Review of Systems  Musculoskeletal: Positive for gait  problem.       Spasms  Psychiatric/Behavioral: Positive for confusion and dysphoric mood. The patient is nervous/anxious.   All other systems reviewed and are negative.      Objective:   Physical Exam  Constitutional: She is oriented to person, place, and time. She appears well-developed and well-nourished.  HENT:  Head: Normocephalic and atraumatic.  Neck: Normal range of motion. Neck supple.  Cardiovascular: Normal rate and regular rhythm.   Pulmonary/Chest: Effort normal and breath sounds normal.  Musculoskeletal:  Normal Muscle Bulk and Muscle Testing Reveals: Upper Extremities: Full ROM and Muscle Strength 5/5 Thoracic and Lumbar Hypersensitivity Lower  Extremities: Full ROM and Muscle Strength 5/5 Bilateral Lower Extremities Flexion Produces Pain into Popliteal Fossa Pt. Escorted to stretcher via EMS   Neurological: She is alert and oriented to person, place, and time.  Skin: Skin is warm and dry.  Psychiatric: She has a normal mood and affect.  Nursing note and vitals reviewed.         Assessment & Plan:  . Multiple Sclerosis: Neurology Following: Dr. Sherryll Burger  2. Low back pain: Continue Current exercise Regime. Continue using heat therapy.  Refilled: oxyCODONE 7.5/325 mg one tablet every 6 hours as need for pain #100. 3. Muscle Spasms:Continue Tizanidine 4 mg one tablet three times a day. 4. Depression/anxiety:Psychiatry Following: Continue Current Medication Regime. Xanax and Cymbalta 5. Morbid obesity:Encouraged to continue with weight Loss and Healthy Living Regimen. 6. Insomnia: Continue Ambien and Continue Sleepy-time Tea. 7. Chest Pain: EMS Transferred to [facility]  45 minutes of face to face patient care time was spent during this visit. All questions were encouraged and answered.   F/U in 1 month

## 2014-07-23 NOTE — ED Notes (Signed)
Bed: [facility] Expected date:  Expected time:  Means of arrival:  Comments: Chest pain/suicidal

## 2014-07-23 NOTE — ED Notes (Signed)
Bed: [facility] Expected date:  Expected time:  Means of arrival:  Comments: Hold for 20

## 2014-07-23 NOTE — ED Provider Notes (Signed)
CSN: [id number]     Arrival date & time 07/23/14  1256 History   First MD Initiated Contact with Patient 07/23/14 1314     Chief Complaint  Patient presents with  . Pain  . Suicidal     (Consider location/radiation/quality/duration/timing/severity/associated sxs/prior Treatment) HPI   44 year old female with history of multiple sclerosis, hypertension, gestational diabetes, and GERD her pain management specialist for evaluation of suicidal ideation and chest pain. Patient has chronic pain and was seen at pain clinic for medication refill. Pain is primarily to her low back and her lower extremities. Furthermore she expressed that she is under a lot of stress due to with family issues and with his son. Patient states she has 5 sons and they are getting trouble. She cannot keep up with them. She express "like would be better if I wasn't here" but she denies having any specific suicidal plan. She reported pain is causing her to lose sleep, decreased appetite, and having worsening of her multiple sclerosis including worsening back pain and leg pain. She denies self medicating with either drugs or alcohol. She does have a psychiatrist, Dr. Marguerite Olea she was prescribed Cymbalta and Xanax for her anxiety depression. Patient felt Xanax has not helped but her doctor refused to increase the dose. When endorsing chest pain, patient states she has had persistent midsternal chest pain ongoing for more than a week. Pain is described as a tightness sensation radiates to her throat worsening with stressed. She does endorse lightheadedness, dizziness, nauseous which has been persistent as well. She does not have any significant family history of cardiac disease and she is not a smoker. She does endorse mild discomfort to the chest at this time.     Past Medical History  Diagnosis Date  . Neuromuscular disorder 2011    MS  . Hypertension   . GERD (gastroesophageal reflux disease)   . Gestational diabetes     Past Surgical History  Procedure Laterality Date  . Ovarian cyst removal    . Cesarean section      x2   Family History  Problem Relation Age of Onset  . Hypertension Mother   . Diabetes Father   . Hypertension Father    History  Substance Use Topics  . Smoking status: Never Smoker   . Smokeless tobacco: Never Used  . Alcohol Use: Not on file   OB History    No data available     Review of Systems  All other systems reviewed and are negative.     Allergies  Advil  Home Medications   Prior to Admission medications   Medication Sig Start Date End Date Taking? Authorizing Provider  ALPRAZolam Prudy Feeler) 1 MG tablet Take 1 mg by mouth daily as needed for anxiety. Dr Laroy Apple rx   Yes Historical Provider, MD  baclofen (LIORESAL) 10 MG tablet Take 10 mg by mouth 3 (three) times daily as needed for muscle spasms.  06/22/14  Yes Historical Provider, MD  cetirizine (ZYRTEC) 10 MG tablet Take 1 tablet (10 mg total) by mouth daily. 03/30/14  Yes Jones Bales, NP  DULoxetine (CYMBALTA) 60 MG capsule Take 60 mg by mouth 2 (two) times daily. 06/22/14  Yes Historical Provider, MD  fluticasone (FLONASE) 50 MCG/ACT nasal spray Place 1 spray into both nostrils daily as needed for allergies.  06/17/13  Yes Historical Provider, MD  gabapentin (NEURONTIN) 600 MG tablet Take 1 tablet (600 mg total) by mouth 3 (three) times daily. 05/07/13  Yes  Ranelle Oyster, MD  hydrochlorothiazide (HYDRODIURIL) 25 MG tablet Take 25 mg by mouth daily.   Yes Historical Provider, MD  interferon beta-1a (REBIF) 44 MCG/0.5ML injection Inject 44 mcg into the skin 3 (three) times a week.   Yes Historical Provider, MD  oxyCODONE-acetaminophen (PERCOCET) 7.5-325 MG per tablet Take 1 tablet by mouth every 6 (six) hours as needed. Patient taking differently: Take 1 tablet by mouth every 6 (six) hours as needed for moderate pain or severe pain.  07/23/14  Yes Jones Bales, NP  pantoprazole (PROTONIX) 40 MG tablet  Take 40 mg by mouth daily.   Yes Historical Provider, MD  tiZANidine (ZANAFLEX) 4 MG tablet Take 1 tablet (4 mg total) by mouth 3 (three) times daily. Patient taking differently: Take 4 mg by mouth 3 (three) times daily as needed for muscle spasms.  06/26/14  Yes Jones Bales, NP  zolpidem (AMBIEN) 10 MG tablet Take 10 mg by mouth at bedtime as needed for sleep.  05/16/13  Yes Historical Provider, MD  lidocaine (XYLOCAINE) 5 % ointment Apply topically as needed. Patient not taking: Reported on 07/23/2014 03/30/14   Jones Bales, NP  oxcarbazepine (TRILEPTAL) 600 MG tablet Take 1 tablet (600 mg total) by mouth 2 (two) times daily. Patient not taking: Reported on 07/23/2014 03/17/13   Ranelle Oyster, MD   BP 152/76 mmHg  Pulse 76  Temp(Src) 98.6 F (37 C) (Oral)  Resp 18  SpO2 99% Physical Exam  Constitutional: She is oriented to person, place, and time. She appears well-developed and well-nourished. No distress.  Mobility obese African-American female, tearful, but nontoxic in appearance  HENT:  Head: Atraumatic.  Eyes: Conjunctivae are normal.  Neck: Neck supple.  Cardiovascular: Normal rate and regular rhythm.   Pulmonary/Chest: Effort normal and breath sounds normal.  Abdominal: Soft. There is no tenderness.  Neurological: She is alert and oriented to person, place, and time.  Skin: No rash noted.  Psychiatric: Her speech is normal. Her mood appears anxious. Thought content is not paranoid. She exhibits a depressed mood. She expresses no homicidal and no suicidal ideation.  Tearful but cooperative.  Nursing note and vitals reviewed.   ED Course  Procedures (including critical care time)  Patient was seen initially at the pain management clinic for her chronic pain related to her multiple sclerosis. She was sent here due to her suicidal ideation without specific plan and complaint of chest pain. Chest pain is atypical for ACS given the duration and description of it. She has a  heart score of 1, low risk for MACE.    3:03 PM EKG without acute ischemic change or abnormal arrhythmia. Labs are mostly reassuring. Mild anemia noted. Chest x-ray is unremarkable. Patient is medically cleared. Will require TTS consultation for psychiatric management.  Labs Review Labs Reviewed  CBC WITH DIFFERENTIAL/PLATELET - Abnormal; Notable for the following:    Hemoglobin 10.7 (*)    HCT 34.5 (*)    MCV 76.2 (*)    MCH 23.6 (*)    RDW 16.6 (*)    Neutrophils Relative % 36 (*)    Lymphocytes Relative 53 (*)    All other components within normal limits  COMPREHENSIVE METABOLIC PANEL - Abnormal; Notable for the following:    Chloride 100 (*)    Calcium 8.6 (*)    AST 63 (*)    Total Bilirubin 0.2 (*)    All other components within normal limits  URINE RAPID DRUG SCREEN, HOSP PERFORMED -  Abnormal; Notable for the following:    Benzodiazepines POSITIVE (*)    All other components within normal limits  PREGNANCY, URINE  ETHANOL  I-STAT TROPOININ, ED    Imaging Review Dg Chest 2 View  07/23/2014   CLINICAL DATA:  Diffuse pain today. No known injury. Initial encounter.  EXAM: CHEST  2 VIEW  COMPARISON:  None.  FINDINGS: Heart size and mediastinal contours are within normal limits. Both lungs are clear. Visualized skeletal structures are unremarkable.  IMPRESSION: Negative exam.   Electronically Signed   By: Drusilla Kanner M.D.   On: 07/23/2014 14:27     EKG Interpretation None      Date: 07/23/2014  Rate: 88  Rhythm: normal sinus rhythm  QRS Axis: normal  Intervals: normal  ST/T Wave abnormalities: normal  Conduction Disutrbances: none  Narrative Interpretation:   Old EKG Reviewed: No significant changes noted     MDM   Final diagnoses:  Chest pain  Depression with anxiety  Suicidal ideation    BP 157/90 mmHg  Pulse 81  Temp(Src) 98.6 F (37 C) (Oral)  Resp 24  SpO2 92%  LMP 06/27/2014  I have reviewed nursing notes and vital signs. I personally  viewed the imaging tests through PACS system and agrees with radiologist's intepretation I reviewed available ER/hospitalization records through the EMR     Fayrene Helper, PA-C 07/23/14 1513  Mirian Mo, MD 07/24/14 1034

## 2014-07-23 NOTE — BH Assessment (Signed)
Assessment Note  Mariah Morris is an 44 y.o. female that presents to [facility] for medical clearance. Patient was referred to [facility] intially for chest pain. Patient was evaluated by [facility] provider and medically cleared. Per medical provider, "Upon evaluating medical symptoms patient reported suicidal ideations." Patient denies suicidal plan. She reports increased depression, stress, and anxiety. She has (3) adult sons that constantly ask her for favors such as car rides back and forth to work and money. She also has a son that she worries about because he likes to drink and drive. Patient is also the mother of 3 yr old twins and worries about not being able to care for them.  She reports that her "baby daddy" lives with her and he is not supportive. Pt sts, "He comes home every night, gets drunk, and argues with me about dinner not being done". Patient is not homicidal towards her "baby daddy" or anyone else. Patient however sts that he often makes her so angry that she wants to hit him. Patient sts, "Athough I want to hit him I will not do it b/c I know I will get in trouble and not see my girls". Patient's depressive symptoms include hopelessness, fatigue, crying spells, and worthlessness. She reports severe anxiety and would like her Xanax increased. Patient denies AVH's. She reports nightmares and she is unable to sleep until 3am. Patient has no appetite. She does not have a psychiatrist/therapist. Patient has received inpatient treatment 1x [facility]) for depression and suicidal thoughts (4 yrs ago).   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 Neuromuscular disorder 2011    MS  . Hypertension   . GERD (gastroesophageal reflux disease)   . Gestational diabetes    Axis IV: other psychosocial or environmental problems, problems related to social environment, problems with access to health care services and problems with  primary support group Axis V: 31-40 impairment in reality testing  Past Medical History:  Past Medical History  Diagnosis Date  . Neuromuscular disorder 2011    MS  . Hypertension   . GERD (gastroesophageal reflux disease)   . Gestational diabetes     Past Surgical History  Procedure Laterality Date  . Ovarian cyst removal    . Cesarean section      x2    Family History:  Family History  Problem Relation Age of Onset  . Hypertension Mother   . Diabetes Father   . Hypertension Father     Social History:  reports that she has never smoked. She has never used smokeless tobacco. Her alcohol and drug histories are not on file.  Additional Social History:  Alcohol / Drug Use Pain Medications: SEE MAR Prescriptions: SEE MAR Over the Counter: SEE MAR History of alcohol / drug use?: No history of alcohol / drug abuse  CIWA: CIWA-Ar BP: 143/79 mmHg Pulse Rate: 81 COWS:    Allergies:  Allergies  Allergen Reactions  . Advil [Ibuprofen] Itching    Home Medications:  (Not in a hospital admission)  OB/GYN Status:  Patient's last menstrual period was 06/27/2014.  General Assessment Data Location of Assessment: [facility] Is this a Tele or Face-to-Face Assessment?: Face-to-Face Is this an Initial Assessment or a Re-assessment for this encounter?: Initial Assessment Marital status: Single Maiden name:  Ezzard Standing) Is patient pregnant?: No Pregnancy Status: No Living Arrangements: Other (Comment), Children, Spouse/significant other (63 yr old son, (2) 3 yr  old daughters, "my baby daddy") Can pt return to current living arrangement?: Yes Admission Status: Voluntary Is patient capable of signing voluntary admission?: Yes Referral Source: Self/Family/Friend Insurance type:  [employer] Medicare/[employer] Medicare HMO)     Crisis Care Plan Living Arrangements: Other (Comment), Children, Spouse/significant other (82 yr old son, (2) 60 yr old daughters, "my baby daddy") Name of  Psychiatrist:  (No pyschiatrist  ) Name of Therapist:  (No therapist )  Education Status Is patient currently in school?: No Current Grade:  (n/a) Highest grade of school patient has completed:  (unk) Name of school:  (n/a) Contact person:  (n/a)  Risk to self with the past 6 months Suicidal Ideation: Yes-Currently Present Has patient been a risk to self within the past 6 months prior to admission? : No Suicidal Intent: No Has patient had any suicidal intent within the past 6 months prior to admission? : No Is patient at risk for suicide?: No Suicidal Plan?: No Has patient had any suicidal plan within the past 6 months prior to admission? : No Access to Means: No What has been your use of drugs/alcohol within the last 12 months?:  (patient denies ) Previous Attempts/Gestures: No How many times?:  (n/a) Other Self Harm Risks:  (n/a) Triggers for Past Attempts: Other (Comment) (no previous suicidal attempts/gestures) Intentional Self Injurious Behavior: None Family Suicide History: No Recent stressful life event(s): Other (Comment) ("No support from my baby daddy", mother of twins, adult sons) Persecutory voices/beliefs?: No Depression: Yes Depression Symptoms: Feeling angry/irritable, Feeling worthless/self pity, Loss of interest in usual pleasures, Guilt, Fatigue, Isolating, Tearfulness, Insomnia, Despondent Substance abuse history and/or treatment for substance abuse?: No Suicide prevention information given to non-admitted patients: Not applicable  Risk to Others within the past 6 months Homicidal Ideation: No Does patient have any lifetime risk of violence toward others beyond the six months prior to admission? : No Thoughts of Harm to Others: No Current Homicidal Intent: No Current Homicidal Plan: No Access to Homicidal Means: No Identified Victim:  (n/a) History of harm to others?: No Assessment of Violence: None Noted Violent Behavior Description:  (patient is calm  and cooperative ) Does patient have access to weapons?: No Criminal Charges Pending?: No Does patient have a court date: No Is patient on probation?: No  Psychosis Hallucinations: None noted Delusions: None noted  Mental Status Report Appearance/Hygiene: Disheveled Eye Contact: Fair Motor Activity: Freedom of movement Speech: Logical/coherent Level of Consciousness: Alert Mood: Depressed, Sad Affect: Depressed Anxiety Level: None Thought Processes: Coherent, Relevant Judgement: Unimpaired Orientation: Person, Time, Place, Situation Obsessive Compulsive Thoughts/Behaviors: None  Cognitive Functioning Concentration: Decreased Memory: Recent Intact, Remote Intact IQ: Above Average Insight: Fair Impulse Control: Fair Appetite: Fair Weight Loss:  (none reported ) Weight Gain:  (none reported) Sleep: Decreased Total Hours of Sleep:  ("I can't fall asleep until 3am") Vegetative Symptoms: None  ADLScreening [facility] Assessment Services) Patient's cognitive ability adequate to safely complete daily activities?: Yes Patient able to express need for assistance with ADLs?: Yes Independently performs ADLs?: Yes (appropriate for developmental age)  Prior Inpatient Therapy Prior Inpatient Therapy: Yes Prior Therapy Dates:  (current) Prior Therapy Facilty/Provider(s):  [facility]) Reason for Treatment:  (suicidal and depression)  Prior Outpatient Therapy Prior Outpatient Therapy: No Prior Therapy Dates:  (n/a) Prior Therapy Facilty/Provider(s):  (n/a) Reason for Treatment:  (n/a) Does patient have an ACCT team?: No Does patient have Intensive In-House Services?  : No Does patient have [facility] services? : No Does patient have P4CC services?:  No  ADL Screening (condition at time of admission) Patient's cognitive ability adequate to safely complete daily activities?: Yes Is the patient deaf or have difficulty hearing?: No Does the patient have difficulty seeing, even when wearing  glasses/contacts?: No Does the patient have difficulty concentrating, remembering, or making decisions?: No Patient able to express need for assistance with ADLs?: Yes Does the patient have difficulty dressing or bathing?: No Independently performs ADLs?: Yes (appropriate for developmental age) Does the patient have difficulty walking or climbing stairs?: No Weakness of Legs: None Weakness of Arms/Hands: None  Home Assistive Devices/Equipment Home Assistive Devices/Equipment: None    Abuse/Neglect Assessment (Assessment to be complete while patient is alone) Physical Abuse: Denies Verbal Abuse: Denies Sexual Abuse: Denies Exploitation of patient/patient's resources: Denies Self-Neglect: Denies Values / Beliefs Cultural Requests During Hospitalization: None Spiritual Requests During Hospitalization: None        Additional Information 1:1 In Past 12 Months?: No CIRT Risk: No Elopement Risk: No Does patient have medical clearance?: Yes     Disposition:  Disposition Initial Assessment Completed for this Encounter: Yes Disposition of Patient: Outpatient treatment, Other dispositions Julieanne Cotton, NP recommends the observation unit @ [facility]) Patient referred to: Other (Comment) [facility] observation unit)  On Site Evaluation by:   Reviewed with Physician:    Melynda Ripple [facility] 07/23/2014 4:11 PM

## 2014-07-23 NOTE — BH Assessment (Signed)
BHH Assessment Progress Note  Per Dahlia Byes, NP, this pt would benefit from admission to the Observation Unit at [facility].  Berneice Heinrich, RN, [facility] has agreed to accept pt to Observation once a bed becomes available after 21:00 today.  Pt has signed Voluntary Admission and Consent for Treatment, as well as Consent to Release Information to [facility] in [place], and a notification call has been places.  Signed forms have been faxed to [facility].  Pt's nurse has been notified, and agrees to send original paperwork along with pt via [place], and to call report to [phone].  Doylene Canning, MA Triage Specialist [phone]

## 2014-07-24 ENCOUNTER — Encounter (HOSPITAL_COMMUNITY): Payer: Self-pay | Admitting: *Deleted

## 2014-07-24 DIAGNOSIS — F411 Generalized anxiety disorder: Secondary | ICD-10-CM | POA: Diagnosis not present

## 2014-07-24 DIAGNOSIS — F329 Major depressive disorder, single episode, unspecified: Secondary | ICD-10-CM | POA: Diagnosis not present

## 2014-07-24 DIAGNOSIS — F322 Major depressive disorder, single episode, severe without psychotic features: Secondary | ICD-10-CM | POA: Diagnosis not present

## 2014-07-24 MED ORDER — HYDROXYZINE HCL 50 MG PO TABS: 50.0000 mg | ORAL_TABLET | Freq: Three times a day (TID) | ORAL | Status: DC | PRN

## 2014-07-24 MED ORDER — CETIRIZINE HCL 10 MG PO TABS: 10.0000 mg | ORAL_TABLET | Freq: Every day | ORAL | Status: DC

## 2014-07-24 MED ORDER — DULOXETINE HCL 60 MG PO CPEP: 60.0000 mg | ORAL_CAPSULE | Freq: Two times a day (BID) | ORAL | Status: DC

## 2014-07-24 MED ORDER — HYDROXYZINE HCL 50 MG PO TABS: 50.0000 mg | ORAL_TABLET | Freq: Three times a day (TID) | ORAL | Status: AC | PRN

## 2014-07-24 MED ORDER — TRAZODONE HCL 100 MG PO TABS: 100.0000 mg | ORAL_TABLET | Freq: Every evening | ORAL | Status: DC | PRN

## 2014-07-24 MED ORDER — PANTOPRAZOLE SODIUM 40 MG PO TBEC: 40.0000 mg | DELAYED_RELEASE_TABLET | Freq: Every day | ORAL | Status: AC

## 2014-07-24 MED ORDER — TIZANIDINE HCL 4 MG PO TABS: 4.0000 mg | ORAL_TABLET | Freq: Three times a day (TID) | ORAL | Status: DC

## 2014-07-24 MED ORDER — GABAPENTIN 600 MG PO TABS: 600.0000 mg | ORAL_TABLET | Freq: Three times a day (TID) | ORAL | Status: DC

## 2014-07-24 MED ORDER — HYDROCHLOROTHIAZIDE 25 MG PO TABS: 25.0000 mg | ORAL_TABLET | Freq: Every day | ORAL | Status: AC

## 2014-07-24 NOTE — Progress Notes (Signed)
Patient ID: Mariah Morris, female   DOB: 1970-05-20, 44 y.o.   MRN: [id number] D-States last night best night of sleep she has had in a long time. She is stressed out at home by her drunk husband, 39 yo twins and 3 grown sons always worrying her.She is open to counsling and is considering leaving her husband. She feels she can afford her own place on her disability. A-Support offered Monitored for safety medications as ordered. R-No complaints at this time. Had Xanax this am due to her complaint of anxiety and is tearful. She is waiting to see the doctor for a disposition.

## 2014-07-24 NOTE — BHH Counselor (Signed)
Mariah Head, NP recommends patient be discharged with plan to follow-up with [facility] walk-in clinic in [place], [place]. Counselor contacted [facility] in [place], [place] at [phone] and spoke with Clydie Braun who stated that the patient will be able to walk-in the clinic until 5pm and request to be seen for a "hospital follow-up."

## 2014-07-24 NOTE — BHH Counselor (Signed)
Counselor met with pt at her bedside to discuss her reasons for admission. Pt stated that she has been really stressed out lately and it seems as though no one understands. Pt shared that she has a total of 5 children and her oldest 3 (sons) are causing her the most stress due to getting in trouble. Pt also shared that she has a strained relationship with her 2 youngest children's father. Pt shares that he drinks on a daily basis and he often tells her that nothing is wrong with her. Pt also shared that he is not supportive of her mental health treatment and that she would like for him to attend some counseling sessions with her.Pt also shared that she currently leaves with her two youngest children's father, but lately she has been considering moving into her own place. Pt shared that she sees a Teacher, music at [employer] in [place]. Counselor feels as though pt could greatly benefit from receiving some inpatient services to assist her with learning and implementing coping strategies and skills designed to target her depression and episodes of anxiety. Counselor also suggested that follow up with her psychiatrist at [facility] once she is discharged. Pt was very receptive of the information presented to her. Pt currently denies SI/HI.   Redmond Pulling, MA OBS Counselor

## 2014-07-24 NOTE — Progress Notes (Signed)
B[facility] INPATIENT:  Family/Significant Other Suicide Prevention Education  Suicide Prevention Education:  Education Completed; Avanell Shackleton, son has been identified by the patient as the family member/significant other with whom the patient will be residing, and identified as the person(s) who will aid the patient in the event of a mental health crisis (suicidal ideations/suicide attempt).  With written consent from the patient, the family member/significant other has been provided the following suicide prevention education, prior to the and/or following the discharge of the patient.  The suicide prevention education provided includes the following:  Suicide risk factors  Suicide prevention and interventions  National Suicide Hotline telephone number  [facility] assessment telephone number  [facility] Emergency Assistance 911  [facility] and/or Residential Mobile Crisis Unit telephone number  Request made of family/significant other to:  Remove weapons (e.g., guns, rifles, knives), all items previously/currently identified as safety concern.    Remove drugs/medications (over-the-counter, prescriptions, illicit drugs), all items previously/currently identified as a safety concern.  The family member/significant other verbalizes understanding of the suicide prevention education information provided.  The family member/significant other agrees to remove the items of safety concern listed above.  Roselie Skinner [facility] 07/24/2014, 12:04 AM

## 2014-07-24 NOTE — Discharge Summary (Signed)
[facility] OBS UNIT DISCHARGE SUMMARY  Patient Identification: Mariah Morris MRN:  [id number] Principal Diagnosis: Major depressive disorder and GAD Diagnosis:   Patient Active Problem List   Diagnosis Date Noted  . GAD (generalized anxiety disorder) [F41.1] 07/24/2014    Priority: High  . Major depressive disorder [F32.2] 07/23/2014    Priority: High  . Morbid obesity [E66.01] 05/27/2014  . Spondylolisthesis of lumbar region [M43.16] 12/10/2012  . Multiple sclerosis [G35] 09/20/2012  . Low back pain radiating to right leg [M54.5] 07/19/2011  . Muscle spasms of lower extremity [M62.49] 07/19/2011  . Cervicalgia [M54.2] 05/16/2011  . Right shoulder pain [M25.511] 05/16/2011  . 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 Right hip pain [M25.551] 05/16/2011  . Neuropathic pain [M79.2] 05/16/2011    Total Time spent with patient: 45 minutes   Subjective:   Mariah Morris is a 44 y.o. female patient admitted with reports of anxiety and depression and fleeting suicidal thoughts without plan. Upon arrival to [facility] OBS Unit, nursing staff report that she was anxious and asking for her Xanax to be increased. Pt came to [place] upon the recommendation of ED staff Julieanne Cotton, NP and Dr. Jannifer Franklin who felt pt could rest and be stabilized short-term in OBS. Pt spent the night in the OBS Unit without incident.  Pt seen and chart reviewed. Pt reports that she has had anxiety/depression currently managed by [facility] from which she just received refills on all her medications 3 days ago. Pt mentioned Xanax dosage being changed briefly. Pt denies suicidal/homicidal ideation and does not appear to be responding to internal stimuli. She does report "hearing whispers sometimes" when she is stressed, but not in the past 2 days. Pt has been resting and reports good sleep last night, although poor at home. Good appetite reported. Affect congruent. Pt reports that her primary stressors are: "boyfriend is always  drunk, kids always borrowing my cars and letting them break, and my chronic pain in my back, chest, ribs, and all over".   [facility] T[facility] social worker called [facility] and they are willing to see pt this afternoon before 5pm. Pt receptive to this idea.   HPI:  Mariah Morris is an 44 y.o. female that presents to [facility] for medical clearance. Patient was referred to [facility] intially for chest pain. Patient was evaluated by [facility] provider and medically cleared. Per medical provider, "Upon evaluating medical symptoms patient reported suicidal ideations." Patient denies suicidal plan. She reports increased depression, stress, and anxiety. She has (3) adult sons that constantly ask her for favors such as car rides back and forth to work and money. She also has a son that she worries about because he likes to drink and drive. Patient is also the mother of 32 yr old twins and worries about not being able to care for them. She reports that her "baby daddy" lives with her and he is not supportive. Pt sts, "He comes home every night, gets drunk, and argues with me about dinner not being done". Patient is not homicidal towards her "baby daddy" or anyone else. Patient however sts that he often makes her so angry that she wants to hit him. Patient sts, "Athough I want to hit him I will not do it b/c I know I will get in trouble and not see my girls". Patient's depressive symptoms include hopelessness, fatigue, crying spells, and worthlessness. She reports severe anxiety and would like her Xanax increased. Patient denies AVH's. She reports nightmares and she is unable  to sleep until 3am. Patient has no appetite. She does not have a psychiatrist/therapist. Patient has received inpatient treatment 1x [facility]) for depression and suicidal thoughts (4 yrs ago).    Past Medical History:  Past Medical History  Diagnosis Date  . Neuromuscular disorder 2011    MS  . Hypertension   . GERD (gastroesophageal reflux disease)   . Gestational  diabetes   . Seizures   . Presence of permanent cardiac pacemaker     Past Surgical History  Procedure Laterality Date  . Ovarian cyst removal    . Cesarean section      x2   Family History:  Family History  Problem Relation Age of Onset  . Hypertension Mother   . Diabetes Father   . Hypertension Father    Social History:  History  Alcohol Use: Not on file     History  Drug Use Not on file    History   Social History  . Marital Status: Single    Spouse Name: N/A  . Number of Children: N/A  . Years of Education: N/A   Social History Main Topics  . Smoking status: Never Smoker   . Smokeless tobacco: Never Used  . Alcohol Use: Not on file  . Drug Use: Not on file  . Sexual Activity: Not on file   Other Topics Concern  . Not on file   Social History Narrative   Additional Social History:                          Allergies:   Allergies  Allergen Reactions  . Advil [Ibuprofen] Itching    Labs:  Results for orders placed or performed during the hospital encounter of 07/23/14 (from the past 48 hour(s))  Glucose, capillary     Status: Abnormal   Collection Time: 07/23/14 10:05 PM  Result Value Ref Range   Glucose-Capillary 137 (H) 65 - 99 mg/dL    Vitals: Blood pressure 108/52, temperature 98.2 F (36.8 C), temperature source Oral, resp. rate 14, height  (1.575 m), weight 131.09 kg (289 lb), last menstrual period 06/27/2014, SpO2 100 %.  Risk to Self:   Risk to Others:   Prior Inpatient Therapy:   Prior Outpatient Therapy:    Current Facility-Administered Medications  Medication Dose Route Frequency Provider Last Rate Last Dose  . acetaminophen (TYLENOL) tablet 650 mg  650 mg Oral Q6H PRN Worthy Flank, NP      . ALPRAZolam Prudy Feeler) tablet 1 mg  1 mg Oral Daily PRN Worthy Flank, NP   1 mg at 07/24/14 0803  . alum & mag hydroxide-simeth (MAALOX/MYLANTA) 200-200-20 MG/5ML suspension 30 mL  30 mL Oral Q4H PRN Worthy Flank, NP   30 mL  at 07/24/14 0435  . baclofen (LIORESAL) tablet 10 mg  10 mg Oral TID PRN Worthy Flank, NP      . DULoxetine (CYMBALTA) DR capsule 60 mg  60 mg Oral BID Worthy Flank, NP   60 mg at 07/24/14 0753  . fluticasone (FLONASE) 50 MCG/ACT nasal spray 1 spray  1 spray Each Nare Daily PRN Worthy Flank, NP      . gabapentin (NEURONTIN) capsule 600 mg  600 mg Oral TID Worthy Flank, NP   600 mg at 07/24/14 0753  . hydrochlorothiazide (HYDRODIURIL) tablet 25 mg  25 mg Oral Daily Worthy Flank, NP   25 mg at 07/24/14 0754  .  interferon beta-1a (REBIF) injection 44 mcg  44 mcg Subcutaneous Once per day on Mon Wed Fri Worthy Flank, NP   44 mcg at 07/24/14 0849  . loratadine (CLARITIN) tablet 10 mg  10 mg Oral Daily Worthy Flank, NP   10 mg at 07/24/14 0754  . magnesium hydroxide (MILK OF MAGNESIA) suspension 30 mL  30 mL Oral Daily PRN Worthy Flank, NP   30 mL at 07/24/14 0435  . oxyCODONE-acetaminophen (PERCOCET/ROXICET) 5-325 MG per tablet 1 tablet  1 tablet Oral Q6H PRN Nelly Rout, MD   1 tablet at 07/23/14 2351   And  . oxyCODONE (Oxy IR/ROXICODONE) immediate release tablet 2.5 mg  2.5 mg Oral Q6H PRN Nelly Rout, MD   2.5 mg at 07/23/14 2350  . pantoprazole (PROTONIX) EC tablet 40 mg  40 mg Oral Daily Worthy Flank, NP   40 mg at 07/24/14 0754  . tiZANidine (ZANAFLEX) tablet 4 mg  4 mg Oral TID PRN Worthy Flank, NP      . traZODone (DESYREL) tablet 100 mg  100 mg Oral QHS PRN Worthy Flank, NP      . zolpidem (AMBIEN) tablet 5 mg  5 mg Oral QHS PRN Worthy Flank, NP        Musculoskeletal: Strength & Muscle Tone: within normal limits Gait & Station: normal Patient leans: N/A  Psychiatric Specialty Exam: Physical Exam  Review of Systems  Musculoskeletal: Positive for myalgias (pt reports chronic back pain which also causes her rib and chest pain) and back pain.  Psychiatric/Behavioral: Positive for depression and hallucinations (sometimes hears whispering but no  commands and not in 2 days). Negative for suicidal ideas. The patient is nervous/anxious.   All other systems reviewed and are negative.   Blood pressure 108/52, temperature 98.2 F (36.8 C), temperature source Oral, resp. rate 14, height 5\' 2"  (1.575 m), weight 131.09 kg (289 lb), last menstrual period 06/27/2014, SpO2 100 %.Body mass index is 52.85 kg/(m^2).  General Appearance: Casual and Fairly Groomed  Patent attorney::  Good  Speech:  Clear and Coherent and Normal Rate  Volume:  Normal  Mood:  Anxious  Affect:  Appropriate and Congruent  Thought Process:  Circumstantial, Coherent and Goal Directed  Orientation:  Full (Time, Place, and Person)  Thought Content:  Rumination and About her children "always asking me to borrow the cars, and my boyfriend always being drunk"  Suicidal Thoughts:  No  Homicidal Thoughts:  No  Memory:  Immediate;   Fair Recent;   Fair Remote;   Fair  Judgement:  Fair  Insight:  Good  Psychomotor Activity:  Normal  Concentration:  Good  Recall:  Good  Fund of Knowledge:Good  Language: Good  Akathisia:  No  Handed:    AIMS (if indicated):     Assets:  Communication Skills Desire for Improvement Resilience Social Support  ADL's:  Intact  Cognition: WNL  Sleep:      Treatment Plan Summary: Major depressive disorder, stable for discharge, does not warrant inpatient admission  Medications: Script given for Vistaril 50mg  tid PRN anxiety #21 (pt used to Xanax)  Disposition:  -Discharge home -Follow-up with walk-in at [facility] (SW already called and [facility] states pt will be seen today before 5pm) -Script as above  Beau Fanny, FNP-BC 07/24/2014 11:06 AM

## 2014-07-24 NOTE — Progress Notes (Addendum)
Patient woke up around 4:30. She requested for something to drink and asked for saltine crackers. Staff offered patient diet ginger ale and saltines. Then she said she had pain on her rt clavicle area. Writer assessed patient. She denied any radiation of pain to her neck or arm. V/S checked and it was within normal limit. She also reported that she hasn't had bowel movement in days. Writer offered patient Mylanta for indigestion and MOM for constipation. Notified NP about patient's complaint. Patient took the medications and went back to sleep. No distress noted. V/S would be rechecked in about an hour. 0600 am; Pt's am V/S  108/52 and pulse 84. She denied chest pain or pain around her clavicle.

## 2014-07-24 NOTE — Discharge Planning (Signed)
Patient will follow-up with [facility] in [place], [place] via [facility].  [facility] contact information is as follows:  [address] [place], [place] [address] [phone]  Patient will need to walk-in today and request to be seen for a "hospital follow-up"   Patient currently receives Psychiatric Services at this location.

## 2014-07-24 NOTE — Progress Notes (Addendum)
This is a 44 years old female admitted for observation due to increase depression and suicide thoughts. Patient reported feeling overwhelmed with stressful life situations. She states; "I 'll be thinking of crazy stuff sometimes, I wonder what life would be like without me". She was tearful during the assessment. Her older child was 32 years old and the youngest were set of 44 years old twins. She also reported having auditory hallucinations. Patient stated that her main stressor is the father of the twins who is an alcoholic and stresses her out a lot. She also identify her medical condition "MS" as another stressor. " I have pain every time" . Her skin assessment was within normal limit.

## 2014-07-24 NOTE — Progress Notes (Signed)
Patient ID: Mariah Morris, female   DOB: Jun 07, 1970, 44 y.o.   MRN: [id number] Discharge Note-Seen by Maura Crandall NP this am and consulted with Dr Lucianne Muss and the decision was made to discharge her home with follow up with [facility] who she has been seeing for her outpatient care. She has medications from [facility], but Renata Caprice wrote her a RX for Trazadone and Vistaril. She had been asking for a letter from Renata Caprice to her Dr at [employer] recommending a stronger RX than the Xanax she is taking because she says it just isnt doing what is is supposed to do.Told her he would not be able to do that. Reviewed her discharge plans and she verbalized her understanding. SHe had all of her property returned to her but chose to go home in her scrubs. Her son is picking her up. She denies any thoughts to hurt self or others, and says she will ask her husband to leave rather than hurt him as she sometimes feels like doing. Expressed her appreciation for her treatment here.Escorted to lobby to wait on her ride.

## 2014-07-24 NOTE — H&P (Signed)
[facility] OBS UNIT H&P  Patient Identification: Mariah Morris MRN:  [id number] Principal Diagnosis: Major depressive disorder and GAD Diagnosis:   Patient Active Problem List   Diagnosis Date Noted  . GAD (generalized anxiety disorder) [F41.1] 07/24/2014    Priority: High  . Major depressive disorder [F32.2] 07/23/2014    Priority: High  . Morbid obesity [E66.01] 05/27/2014  . Spondylolisthesis of lumbar region [M43.16] 12/10/2012  . Multiple sclerosis [G35] 09/20/2012  . Low back pain radiating to right leg [M54.5] 07/19/2011  . Muscle spasms of lower extremity [M62.49] 07/19/2011  . Cervicalgia [M54.2] 05/16/2011  . Right shoulder pain [M25.511] 05/16/2011  . 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 Right hip pain [M25.551] 05/16/2011  . Neuropathic pain [M79.2] 05/16/2011    Total Time spent with patient: 45 minutes  Subjective:   Mariah Morris is a 44 y.o. female patient admitted with reports of anxiety and depression and fleeting suicidal thoughts without plan. Upon arrival to [facility] OBS Unit, nursing staff report that she was anxious and asking for her Xanax to be increased. Pt came to [place] upon the recommendation of ED staff Julieanne Cotton, NP and Dr. Jannifer Franklin who felt pt could rest and be stabilized short-term in OBS. Pt spent the night in the OBS Unit without incident.  Pt seen and chart reviewed. Pt reports that she has had anxiety/depression currently managed by [facility] from which she just received refills on all her medications 3 days ago. Pt mentioned Xanax dosage being changed briefly. Pt denies suicidal/homicidal ideation and does not appear to be responding to internal stimuli. She does report "hearing whispers sometimes" when she is stressed, but not in the past 2 days. Pt has been resting and reports good sleep last night, although poor at home. Good appetite reported. Affect congruent. Pt reports that her primary stressors are: "boyfriend is always drunk, kids always  borrowing my cars and letting them break, and my chronic pain in my back, chest, ribs, and all over".  [facility] social worker called [facility] and they are willing to see pt this afternoon before 5pm.   HPI:  Mariah Morris is an 44 y.o. female that presents to [facility] for medical clearance. Patient was referred to [facility] intially for chest pain. Patient was evaluated by [facility] provider and medically cleared. Per medical provider, "Upon evaluating medical symptoms patient reported suicidal ideations." Patient denies suicidal plan. She reports increased depression, stress, and anxiety. She has (3) adult sons that constantly ask her for favors such as car rides back and forth to work and money. She also has a son that she worries about because he likes to drink and drive. Patient is also the mother of 57 yr old twins and worries about not being able to care for them. She reports that her "baby daddy" lives with her and he is not supportive. Pt sts, "He comes home every night, gets drunk, and argues with me about dinner not being done". Patient is not homicidal towards her "baby daddy" or anyone else. Patient however sts that he often makes her so angry that she wants to hit him. Patient sts, "Athough I want to hit him I will not do it b/c I know I will get in trouble and not see my girls". Patient's depressive symptoms include hopelessness, fatigue, crying spells, and worthlessness. She reports severe anxiety and would like her Xanax increased. Patient denies AVH's. She reports nightmares and she is unable to sleep until 3am. Patient has no appetite.  She does not have a psychiatrist/therapist. Patient has received inpatient treatment 1x [facility]) for depression and suicidal thoughts (4 yrs ago).    Past Medical History:  Past Medical History  Diagnosis Date  . Neuromuscular disorder 2011    MS  . Hypertension   . GERD (gastroesophageal reflux disease)   . Gestational diabetes   . Seizures   . Presence of  permanent cardiac pacemaker     Past Surgical History  Procedure Laterality Date  . Ovarian cyst removal    . Cesarean section      x2   Family History:  Family History  Problem Relation Age of Onset  . Hypertension Mother   . Diabetes Father   . Hypertension Father    Social History:  History  Alcohol Use: Not on file     History  Drug Use Not on file    History   Social History  . Marital Status: Single    Spouse Name: N/A  . Number of Children: N/A  . Years of Education: N/A   Social History Main Topics  . Smoking status: Never Smoker   . Smokeless tobacco: Never Used  . Alcohol Use: Not on file  . Drug Use: Not on file  . Sexual Activity: Not on file   Other Topics Concern  . Not on file   Social History Narrative   Additional Social History:                          Allergies:   Allergies  Allergen Reactions  . Advil [Ibuprofen] Itching    Labs:  Results for orders placed or performed during the hospital encounter of 07/23/14 (from the past 48 hour(s))  Glucose, capillary     Status: Abnormal   Collection Time: 07/23/14 10:05 PM  Result Value Ref Range   Glucose-Capillary 137 (H) 65 - 99 mg/dL    Vitals: Blood pressure 108/52, temperature 98.2 F (36.8 C), temperature source Oral, resp. rate 14, height  (1.575 m), weight 131.09 kg (289 lb), last menstrual period 06/27/2014, SpO2 100 %.  Risk to Self:   Risk to Others:   Prior Inpatient Therapy:   Prior Outpatient Therapy:    Current Facility-Administered Medications  Medication Dose Route Frequency Provider Last Rate Last Dose  . acetaminophen (TYLENOL) tablet 650 mg  650 mg Oral Q6H PRN Worthy Flank, NP      . ALPRAZolam Prudy Feeler) tablet 1 mg  1 mg Oral Daily PRN Worthy Flank, NP   1 mg at 07/24/14 0803  . alum & mag hydroxide-simeth (MAALOX/MYLANTA) 200-200-20 MG/5ML suspension 30 mL  30 mL Oral Q4H PRN Worthy Flank, NP   30 mL at 07/24/14 0435  . baclofen (LIORESAL)  tablet 10 mg  10 mg Oral TID PRN Worthy Flank, NP      . DULoxetine (CYMBALTA) DR capsule 60 mg  60 mg Oral BID Worthy Flank, NP   60 mg at 07/24/14 0753  . fluticasone (FLONASE) 50 MCG/ACT nasal spray 1 spray  1 spray Each Nare Daily PRN Worthy Flank, NP      . gabapentin (NEURONTIN) capsule 600 mg  600 mg Oral TID Worthy Flank, NP   600 mg at 07/24/14 0753  . hydrochlorothiazide (HYDRODIURIL) tablet 25 mg  25 mg Oral Daily Worthy Flank, NP   25 mg at 07/24/14 0754  . interferon beta-1a (REBIF) injection 44 mcg  44 mcg Subcutaneous Once per day on Mon Wed Fri Worthy Flank, NP   44 mcg at 07/24/14 0849  . loratadine (CLARITIN) tablet 10 mg  10 mg Oral Daily Worthy Flank, NP   10 mg at 07/24/14 0754  . magnesium hydroxide (MILK OF MAGNESIA) suspension 30 mL  30 mL Oral Daily PRN Worthy Flank, NP   30 mL at 07/24/14 0435  . oxyCODONE-acetaminophen (PERCOCET/ROXICET) 5-325 MG per tablet 1 tablet  1 tablet Oral Q6H PRN Nelly Rout, MD   1 tablet at 07/23/14 2351   And  . oxyCODONE (Oxy IR/ROXICODONE) immediate release tablet 2.5 mg  2.5 mg Oral Q6H PRN Nelly Rout, MD   2.5 mg at 07/23/14 2350  . pantoprazole (PROTONIX) EC tablet 40 mg  40 mg Oral Daily Worthy Flank, NP   40 mg at 07/24/14 0754  . tiZANidine (ZANAFLEX) tablet 4 mg  4 mg Oral TID PRN Worthy Flank, NP      . traZODone (DESYREL) tablet 100 mg  100 mg Oral QHS PRN Worthy Flank, NP      . zolpidem (AMBIEN) tablet 5 mg  5 mg Oral QHS PRN Worthy Flank, NP        Musculoskeletal: Strength & Muscle Tone: within normal limits Gait & Station: normal Patient leans: N/A  Psychiatric Specialty Exam: Physical Exam  Review of Systems  Musculoskeletal: Positive for myalgias (pt reports chronic back pain which also causes her rib and chest pain) and back pain.  Psychiatric/Behavioral: Positive for depression and hallucinations (sometimes hears whispering but no commands and not in 2 days). Negative for  suicidal ideas. The patient is nervous/anxious.   All other systems reviewed and are negative.   Blood pressure 108/52, temperature 98.2 F (36.8 C), temperature source Oral, resp. rate 14, height  (1.575 m), weight 131.09 kg (289 lb), last menstrual period 06/27/2014, SpO2 100 %.Body mass index is 52.85 kg/(m^2).  General Appearance: Casual and Fairly Groomed  Patent attorney::  Good  Speech:  Clear and Coherent and Normal Rate  Volume:  Normal  Mood:  Anxious  Affect:  Appropriate and Congruent  Thought Process:  Circumstantial, Coherent and Goal Directed  Orientation:  Full (Time, Place, and Person)  Thought Content:  Rumination and About her children "always asking me to borrow the cars, and my boyfriend always being drunk"  Suicidal Thoughts:  No  Homicidal Thoughts:  No  Memory:  Immediate;   Fair Recent;   Fair Remote;   Fair  Judgement:  Fair  Insight:  Good  Psychomotor Activity:  Normal  Concentration:  Good  Recall:  Good  Fund of Knowledge:Good  Language: Good  Akathisia:  No  Handed:    AIMS (if indicated):     Assets:  Communication Skills Desire for Improvement Resilience Social Support  ADL's:  Intact  Cognition: WNL  Sleep:      Treatment Plan Summary: Major depressive disorder, stable for discharge, does not warrant inpatient admission  Medications: Script given for Vistaril  tid PRN anxiety #21 (pt used to Xanax)  Disposition:  -Discharge home -Follow-up with walk-in at [facility] (SW already called and [facility] states pt will be seen today before 5pm) -Script as above   Beau Fanny, FNP-BC 07/24/2014 9:04 AM

## 2014-07-24 NOTE — Discharge Planning (Signed)
Counselor spoke with patient about discharge plans. Patient states that she receives services from [facility] in [place] and feels comfortable walking into the clinic today to request a hospital follow up. Patient states that she received counseling before but stopped going and she feels that it would be helpful to start again. Patient states that she will ask about starting therapy again. Patient states that the counselor has an idea of what is going on and may be able to help her manage her stress level. Patient states that she is able to contact someone for transportation from the hospital to [facility] and she will need to contact them to let them know and they should arrive within 30 minutes. Patient contacted her ride and spoke with the nurse to review AVS. Counselor was available for any additional questions.  Marland Kitchen

## 2014-08-21 ENCOUNTER — Encounter: Payer: Medicare HMO | Attending: Physical Medicine & Rehabilitation | Admitting: Registered Nurse

## 2014-08-21 ENCOUNTER — Encounter: Payer: Self-pay | Admitting: Registered Nurse

## 2014-08-21 VITALS — BP 119/69 | HR 81 | Resp 18

## 2014-08-21 DIAGNOSIS — G35 Multiple sclerosis: Secondary | ICD-10-CM | POA: Diagnosis present

## 2014-08-21 DIAGNOSIS — M545 Low back pain, unspecified: Secondary | ICD-10-CM

## 2014-08-21 DIAGNOSIS — Z79899 Other long term (current) drug therapy: Secondary | ICD-10-CM

## 2014-08-21 DIAGNOSIS — Z5181 Encounter for therapeutic drug level monitoring: Secondary | ICD-10-CM

## 2014-08-21 DIAGNOSIS — M719 Bursopathy, unspecified: Secondary | ICD-10-CM

## 2014-08-21 DIAGNOSIS — M62838 Other muscle spasm: Secondary | ICD-10-CM

## 2014-08-21 DIAGNOSIS — G894 Chronic pain syndrome: Secondary | ICD-10-CM

## 2014-08-21 DIAGNOSIS — M792 Neuralgia and neuritis, unspecified: Secondary | ICD-10-CM

## 2014-08-21 DIAGNOSIS — M4316 Spondylolisthesis, lumbar region: Secondary | ICD-10-CM

## 2014-08-21 MED ORDER — METHYLPREDNISOLONE 4 MG PO TBPK: ORAL_TABLET | ORAL | Status: DC

## 2014-08-21 MED ORDER — OXYCODONE-ACETAMINOPHEN 7.5-325 MG PO TABS: 1.0000 | ORAL_TABLET | Freq: Four times a day (QID) | ORAL | Status: DC | PRN

## 2014-08-21 NOTE — Progress Notes (Signed)
Subjective:    Patient ID: Mariah Morris, female    DOB: 04-09-1970, 44 y.o.   MRN: [id number]  HPI: [name] is a 44 year old female who returns for follow up for chronic pain and medication refill. She says her pain is located in her lower back radiating into lower extremities posteriorly. Also having increase frequency of muscle spasm, will increase tizanidine 1-2 tablets three times a day she verbalizes understanding.Also instructed to start this medication regime when someone is in the home with her to attend to her twins in case of drowsiness she verbalizes understanding. Instructed to call office next week to evaluate treatment she verbalizes understanding. She rates her pain 7. Her current exercise regime is walking and performing stretching exercises. Encouraged to increase her activity as tolerated she verbalizes understanding.  She has resumed group therapy with Dr. Audery Amel and states it is helping. Encouraged to continue with counseling.  Pain Inventory Average Pain 7 Pain Right Now 7 My pain is constant, sharp, stabbing and aching  In the last 24 hours, has pain interfered with the following? General activity 5 Relation with others 5 Enjoyment of life 5 What TIME of day is your pain at its worst? evening night Sleep (in general) NA  Pain is worse with: walking, bending, sitting and some activites Pain improves with: medication Relief from Meds: 6  Mobility walk without assistance ability to climb steps?  no  Function Do you have any goals in this area?  no  Neuro/Psych trouble walking spasms confusion depression anxiety  Prior Studies Any changes since last visit?  no  Physicians involved in your care Any changes since last visit?  no   Family History  Problem Relation Age of Onset  . Hypertension Mother   . Diabetes Father   . Hypertension Father    History   Social History  . Marital Status: Single    Spouse Name: N/A  . Number  of Children: N/A  . Years of Education: N/A   Social History Main Topics  . Smoking status: Never Smoker   . Smokeless tobacco: Never Used  . Alcohol Use: Not on file  . Drug Use: Not on file  . Sexual Activity: Not on file   Other Topics Concern  . None   Social History Narrative   Past Surgical History  Procedure Laterality Date  . Ovarian cyst removal    . Cesarean section      x2   Past Medical History  Diagnosis Date  . Neuromuscular disorder 2011    MS  . Hypertension   . GERD (gastroesophageal reflux disease)   . Gestational diabetes   . Seizures   . Presence of permanent cardiac pacemaker    BP 119/69 mmHg  Pulse 81  Resp 18  SpO2 100%  LMP 06/27/2014  Opioid Risk Score:   Fall Risk Score:  `1  Depression screen PHQ 2/9  Depression screen [facility] 2/9 08/21/2014 04/28/2014  Decreased Interest 1 1  Down, Depressed, Hopeless 1 1  PHQ - 2 Score 2 2  Altered sleeping - 2  Tired, decreased energy - 3  Change in appetite - 3  Feeling bad or failure about yourself  - 0  Trouble concentrating - 3  Moving slowly or fidgety/restless - 0  Suicidal thoughts - 0  PHQ-9 Score - 13     Review of Systems  Musculoskeletal: Positive for gait problem.       Spasms  Psychiatric/Behavioral: Positive  for confusion and dysphoric mood. The patient is nervous/anxious.   All other systems reviewed and are negative.      Objective:   Physical Exam  Constitutional: She is oriented to person, place, and time. She appears well-developed and well-nourished.  HENT:  Head: Normocephalic and atraumatic.  Neck: Normal range of motion. Neck supple.  Cardiovascular: Normal rate and regular rhythm.   Pulmonary/Chest: Effort normal and breath sounds normal.  Musculoskeletal:  Normal Muscle Bulk and Muscle Testing Reveals: Upper Extremities: Full ROM and Muscle Strength 5/5 Bilateral AC Joint Tenderness Lumbar Paraspinal Tenderness: L-1- L-4 Lower Extremities: Full ROM and  Muscle Strength 5/5 Bilateral Lower Extremities Flexion Produces Pain into Lower Extremities Arises from chair slowly Antalgic Gait  Neurological: She is alert and oriented to person, place, and time.  Skin: Skin is warm and dry.  Psychiatric: She has a normal mood and affect.  Nursing note and vitals reviewed.         Assessment & Plan:  1.Multiple Sclerosis: Neurology Following: Dr. Sherryll Burger  2. Low back pain: Continue Current exercise Regime. Continue using heat therapy.  Refilled: oxyCODONE 7.5/325 mg one tablet every 6 hours as need for pain #100. 3. Muscle Spasms:Increase Tizanidine 4 mg one- two tablet's three times a day. 4. Depression/anxiety:Psychiatry Following: Continue Current Medication Regime. Xanax and Cymbalta 5. Morbid obesity:Encouraged to continue with weight Loss and Healthy Living Regimen. 6. Insomnia: Continue Ambien and Continue Sleepy-time Tea. 7. Bursitis: RX: Medrol Dose Pak  20 minutes of face to face patient care time was spent during this visit. All questions were encouraged and answered.   F/U in 1 month

## 2014-09-16 ENCOUNTER — Other Ambulatory Visit: Payer: Self-pay | Admitting: Registered Nurse

## 2014-09-16 ENCOUNTER — Encounter: Payer: Medicare HMO | Attending: Physical Medicine & Rehabilitation | Admitting: Registered Nurse

## 2014-09-16 ENCOUNTER — Encounter: Payer: Self-pay | Admitting: Registered Nurse

## 2014-09-16 VITALS — BP 123/72 | HR 97 | Resp 14

## 2014-09-16 DIAGNOSIS — Z5181 Encounter for therapeutic drug level monitoring: Secondary | ICD-10-CM | POA: Diagnosis not present

## 2014-09-16 DIAGNOSIS — Z79899 Other long term (current) drug therapy: Secondary | ICD-10-CM | POA: Diagnosis not present

## 2014-09-16 DIAGNOSIS — G894 Chronic pain syndrome: Secondary | ICD-10-CM

## 2014-09-16 DIAGNOSIS — G35 Multiple sclerosis: Secondary | ICD-10-CM | POA: Insufficient documentation

## 2014-09-16 DIAGNOSIS — M4316 Spondylolisthesis, lumbar region: Secondary | ICD-10-CM | POA: Diagnosis present

## 2014-09-16 DIAGNOSIS — M545 Low back pain, unspecified: Secondary | ICD-10-CM

## 2014-09-16 DIAGNOSIS — G8929 Other chronic pain: Secondary | ICD-10-CM

## 2014-09-16 DIAGNOSIS — M792 Neuralgia and neuritis, unspecified: Secondary | ICD-10-CM | POA: Diagnosis present

## 2014-09-16 DIAGNOSIS — M79604 Pain in right leg: Secondary | ICD-10-CM

## 2014-09-16 MED ORDER — OXYCODONE-ACETAMINOPHEN 10-325 MG PO TABS: 1.0000 | ORAL_TABLET | Freq: Three times a day (TID) | ORAL | Status: DC | PRN

## 2014-09-16 NOTE — Progress Notes (Signed)
Subjective:    Patient ID: Mariah Morris, female    DOB: Aug 19, 1970, 44 y.o.   MRN: [id number]  HPI:Ms. Sayaka Hoeppner is a 44 year old female who returns for follow up for chronic pain and medication refill. She says her pain is located in her lower back radiating into her right hip and right lower extremities anterior ly and posteriorly. Also states the pain has intensified over the last two week, facial grimacing noted with palpation. Will obtain X-rays of thoracic and lumbar. Also will increase Percocet to 10 mg every 8 hours, will re-valuate next month she verbalizes understanding.  She rates her pain 10. Her current exercise regime is walking and performing chair exercises.  Pain Inventory Average Pain 10 Pain Right Now 10 My pain is constant, sharp, dull, stabbing and aching  In the last 24 hours, has pain interfered with the following? General activity 3 Relation with others 3 Enjoyment of life 3 What TIME of day is your pain at its worst? evening, night Sleep (in general) Poor  Pain is worse with: walking, bending and sitting Pain improves with: rest, heat/ice and medication Relief from Meds: 6  Mobility Do you have any goals in this area?  no  Function Do you have any goals in this area?  no  Neuro/Psych trouble walking spasms confusion depression anxiety  Prior Studies Any changes since last visit?  no  Physicians involved in your care Any changes since last visit?  no   Family History  Problem Relation Age of Onset  . Hypertension Mother   . Diabetes Father   . Hypertension Father    Social History   Social History  . Marital Status: Single    Spouse Name: N/A  . Number of Children: N/A  . Years of Education: N/A   Social History Main Topics  . Smoking status: Never Smoker   . Smokeless tobacco: Never Used  . Alcohol Use: None  . Drug Use: None  . Sexual Activity: Not Asked   Other Topics Concern  . None   Social History Narrative    Past Surgical History  Procedure Laterality Date  . Ovarian cyst removal    . Cesarean section      x2   Past Medical History  Diagnosis Date  . Neuromuscular disorder 2011    MS  . Hypertension   . GERD (gastroesophageal reflux disease)   . Gestational diabetes   . Seizures   . Presence of permanent cardiac pacemaker    BP 123/72 mmHg  Pulse 97  Resp 14  SpO2 95%  Opioid Risk Score:   Fall Risk Score:  `1  Depression screen PHQ 2/9  Depression screen [facility] 2/9 08/21/2014 04/28/2014  Decreased Interest 1 1  Down, Depressed, Hopeless 1 1  PHQ - 2 Score 2 2  Altered sleeping - 2  Tired, decreased energy - 3  Change in appetite - 3  Feeling bad or failure about yourself  - 0  Trouble concentrating - 3  Moving slowly or fidgety/restless - 0  Suicidal thoughts - 0  PHQ-9 Score - 13     Review of Systems  Musculoskeletal: Positive for gait problem.  Neurological:       Spasms  Psychiatric/Behavioral: Positive for confusion and dysphoric mood. The patient is nervous/anxious.   All other systems reviewed and are negative.      Objective:   Physical Exam  Constitutional: She is oriented to person, place, and time. She appears  well-developed and well-nourished.  HENT:  Head: Normocephalic and atraumatic.  Neck: Normal range of motion. Neck supple.  Cardiovascular: Normal rate and regular rhythm.   Pulmonary/Chest: Effort normal and breath sounds normal.  Musculoskeletal:  Normal Muscle Bulk and Muscle Testing Reveals: Upper Extremities: Full ROM and Muscle Strength 5/5 Spinal Forward Flexion 20 Degrees and Extension 10 Degrees Thoracic Hypersensitivity T-10- T-12 Lumbar Hypersensitivity: L-1- L-4 Bilateral Greater Trochanteric Tenderness Lower Extremities: Decreased ROM and Muscle Strength 4/5 Antalgic Gait Antalgic Gait   Neurological: She is alert and oriented to person, place, and time.  Skin: Skin is warm and dry.  Psychiatric: She has a normal mood  and affect.  Nursing note and vitals reviewed.         Assessment & Plan:  1.Multiple Sclerosis: Neurology Following: Dr. Sherryll Burger  2. Low back pain: Continue Current exercise Regime. Continue using heat therapy.  RX: Increased OxyCODONE 10/325 mg one tablet every 8 hours as need for pain # 90. 3. Muscle Spasms:Increase Tizanidine 4 mg one- two tablet's three times a day. 4. Depression/anxiety:Psychiatry Following: Continue Current Medication Regime. Xanax and Cymbalta 5. Morbid obesity:Encouraged to continue with weight Loss and Healthy Living Regimen. 6. Insomnia: Continue Ambien and Continue Sleepy-time Tea. 7. Bursitis: Continue Heat and Ice Therapy   20 minutes of face to face patient care time was spent during this visit. All questions were encouraged and answered.   F/U in 1 month

## 2014-09-17 LAB — PMP ALCOHOL METABOLITE (ETG): ETGU: NEGATIVE ng/mL

## 2014-09-18 ENCOUNTER — Ambulatory Visit
Admission: RE | Admit: 2014-09-18 | Discharge: 2014-09-18 | Disposition: A | Discharge status: Home / Self Care | Payer: Medicare HMO | Source: Ambulatory Visit | Attending: Registered Nurse | Admitting: Registered Nurse

## 2014-09-18 DIAGNOSIS — M545 Low back pain: Secondary | ICD-10-CM | POA: Diagnosis present

## 2014-09-18 DIAGNOSIS — M47814 Spondylosis without myelopathy or radiculopathy, thoracic region: Secondary | ICD-10-CM | POA: Diagnosis not present

## 2014-09-18 DIAGNOSIS — M4316 Spondylolisthesis, lumbar region: Secondary | ICD-10-CM | POA: Insufficient documentation

## 2014-09-18 DIAGNOSIS — G8929 Other chronic pain: Secondary | ICD-10-CM | POA: Diagnosis present

## 2014-09-21 LAB — PRESCRIPTION MONITORING PROFILE (SOLSTAS)
Amphetamine/Meth: NEGATIVE ng/mL
Barbiturate Screen, Urine: NEGATIVE ng/mL
Buprenorphine, Urine: NEGATIVE ng/mL
COCAINE METABOLITES: NEGATIVE ng/mL
CREATININE, URINE: 196.54 mg/dL (ref >20.0)
Cannabinoid Scrn, Ur: NEGATIVE ng/mL
Carisoprodol, Urine: NEGATIVE ng/mL
Fentanyl, Ur: NEGATIVE ng/mL
MDMA URINE: NEGATIVE ng/mL
METHADONE SCREEN, URINE: NEGATIVE ng/mL
Meperidine, Ur: NEGATIVE ng/mL
NITRITES URINE, INITIAL: NEGATIVE ug/mL
OPIATE SCREEN, URINE: NEGATIVE ng/mL
PH URINE, INITIAL: 6.4 pH (ref 4.5–8.9)
Propoxyphene: NEGATIVE ng/mL
TAPENTADOLUR: NEGATIVE ng/mL
TRAMADOL UR: NEGATIVE ng/mL
Zolpidem, Urine: NEGATIVE ng/mL

## 2014-09-21 LAB — OXYCODONE, URINE (LC/MS-MS)
Noroxycodone, Ur: 253 ng/mL (ref <50)
OXYCODONE, UR: NEGATIVE ng/mL — AB (ref <50)
OXYMORPHONE, URINE: NEGATIVE ng/mL — AB (ref <50)

## 2014-09-21 LAB — BENZODIAZEPINES (GC/LC/MS), URINE
ALPRAZOLAMU: 215 ng/mL (ref <25)
CLONAZEPAU: NEGATIVE ng/mL (ref <25)
FLURAZEPAMU: NEGATIVE ng/mL (ref <50)
Lorazepam (GC/LC/MS), ur confirm: NEGATIVE ng/mL (ref <50)
Midazolam (GC/LC/MS), ur confirm: NEGATIVE ng/mL (ref <50)
Nordiazepam (GC/LC/MS), ur confirm: NEGATIVE ng/mL (ref <50)
OXAZEPAMU: NEGATIVE ng/mL (ref <50)
TRIAZOLAMU: NEGATIVE ng/mL (ref <50)
Temazepam (GC/LC/MS), ur confirm: NEGATIVE ng/mL (ref <50)

## 2014-09-25 ENCOUNTER — Telehealth: Payer: Self-pay | Admitting: Registered Nurse

## 2014-09-25 NOTE — Telephone Encounter (Signed)
Attempted to call Ms. Burkman regarding her X-ray Results. [employer].

## 2014-10-07 NOTE — Progress Notes (Signed)
Urine drug screen for this encounter is consistent for prescribed medication 

## 2014-10-12 ENCOUNTER — Encounter: Payer: Medicare HMO | Attending: Physical Medicine & Rehabilitation | Admitting: Registered Nurse

## 2014-10-12 ENCOUNTER — Encounter: Payer: Self-pay | Admitting: Registered Nurse

## 2014-10-12 VITALS — BP 126/70 | HR 92

## 2014-10-12 DIAGNOSIS — Z79899 Other long term (current) drug therapy: Secondary | ICD-10-CM

## 2014-10-12 DIAGNOSIS — Z5181 Encounter for therapeutic drug level monitoring: Secondary | ICD-10-CM

## 2014-10-12 DIAGNOSIS — M7062 Trochanteric bursitis, left hip: Secondary | ICD-10-CM

## 2014-10-12 DIAGNOSIS — M4316 Spondylolisthesis, lumbar region: Secondary | ICD-10-CM

## 2014-10-12 DIAGNOSIS — G894 Chronic pain syndrome: Secondary | ICD-10-CM | POA: Diagnosis not present

## 2014-10-12 DIAGNOSIS — M7061 Trochanteric bursitis, right hip: Secondary | ICD-10-CM

## 2014-10-12 DIAGNOSIS — G35 Multiple sclerosis: Secondary | ICD-10-CM

## 2014-10-12 DIAGNOSIS — M792 Neuralgia and neuritis, unspecified: Secondary | ICD-10-CM | POA: Diagnosis present

## 2014-10-12 DIAGNOSIS — M545 Low back pain: Secondary | ICD-10-CM | POA: Insufficient documentation

## 2014-10-12 MED ORDER — OXYCODONE-ACETAMINOPHEN 10-325 MG PO TABS: 1.0000 | ORAL_TABLET | Freq: Three times a day (TID) | ORAL | Status: DC | PRN

## 2014-10-12 NOTE — Progress Notes (Signed)
Subjective:    Patient ID: Mariah Morris, female    DOB: 1971/01/09, 44 y.o.   MRN: [id number]  HPI: [name] is a 44 year old female who returns for follow up for chronic pain and medication refill. She says her pain is located in her lower back, bilateral hips and  lower extremities. She rates her pain 6. Her current exercise regime is walking and performing chair exercises.  Pain Inventory Average Pain 6 Pain Right Now 6 My pain is sharp, stabbing and aching  In the last 24 hours, has pain interfered with the following? General activity 5 Relation with others 5 Enjoyment of life 5 What TIME of day is your pain at its worst? evening, night Sleep (in general) Fair  Pain is worse with: walking, bending, inactivity, standing and some activites Pain improves with: rest, heat/ice and medication Relief from Meds: 10  Mobility Do you have any goals in this area?  no  Function Do you have any goals in this area?  no  Neuro/Psych trouble walking spasms depression anxiety  Prior Studies Any changes since last visit?  no  Physicians involved in your care Any changes since last visit?  no   Family History  Problem Relation Age of Onset  . Hypertension Mother   . Diabetes Father   . Hypertension Father    Social History   Social History  . Marital Status: Single    Spouse Name: N/A  . Number of Children: N/A  . Years of Education: N/A   Social History Main Topics  . Smoking status: Never Smoker   . Smokeless tobacco: Never Used  . Alcohol Use: None  . Drug Use: None  . Sexual Activity: Not Asked   Other Topics Concern  . None   Social History Narrative   Past Surgical History  Procedure Laterality Date  . Ovarian cyst removal    . Cesarean section      x2   Past Medical History  Diagnosis Date  . Neuromuscular disorder 2011    MS  . Hypertension   . GERD (gastroesophageal reflux disease)   . Gestational diabetes   . Seizures   .  Presence of permanent cardiac pacemaker    BP 126/70 mmHg  Pulse 92  SpO2 99%  Opioid Risk Score:   Fall Risk Score:  `1  Depression screen PHQ 2/9  Depression screen [facility] 2/9 10/12/2014 08/21/2014 04/28/2014  Decreased Interest 1 1 1   Down, Depressed, Hopeless 1 1 1   PHQ - 2 Score 2 2 2   Altered sleeping - - 2  Tired, decreased energy - - 3  Change in appetite - - 3  Feeling bad or failure about yourself  - - 0  Trouble concentrating - - 3  Moving slowly or fidgety/restless - - 0  Suicidal thoughts - - 0  PHQ-9 Score - - 13     Review of Systems  All other systems reviewed and are negative.      Objective:   Physical Exam  Constitutional: She is oriented to person, place, and time. She appears well-developed and well-nourished.  HENT:  Head: Normocephalic and atraumatic.  Neck: Normal range of motion. Neck supple.  Cardiovascular: Normal rate and regular rhythm.   Pulmonary/Chest: Effort normal and breath sounds normal.  Musculoskeletal:  Normal Muscle Bulk and Muscle Testing Reveals: Upper Extremities: Full ROM and Muscle Strength 5/5 Lumbar Paraspinal Tenderness: L-3- L-5 Bilateral Greater Trochanteric Tenderness Lower Extremities: Full ROM and  Muscle Strength 5/5 Arises from chair slowly Antalgic Gait  Neurological: She is alert and oriented to person, place, and time.  Skin: Skin is warm and dry.  Psychiatric: She has a normal mood and affect.  Nursing note and vitals reviewed.         Assessment & Plan:  1.Multiple Sclerosis: Neurology Following: Dr. Sherryll Burger  2. Low back pain: Continue Current exercise Regime. Continue using heat therapy.  Refilled: OxyCODONE 10/325 mg one tablet every 8 hours as need for pain # 90. 3. Muscle Spasms: Continue Tizanidine 4 mg one- two tablet's three times a day. 4. Depression/anxiety:Psychiatry Following Dr. Suzie Portela: Continue Monthly Counseling. Continue Current Medication Regime. Xanax and Cymbalta 5. Morbid  obesity:Encouraged to continue with weight Loss and Healthy Living Regimen. 6. Insomnia: No Complaints Today: Continue Sleepy-time Tea. 7.Greater Trochanteric Bursitis: Continue Heat and Ice Therapy   20 minutes of face to face patient care time was spent during this visit. All questions were encouraged and answered.   F/U in 1 month

## 2014-11-09 ENCOUNTER — Encounter: Payer: Self-pay | Admitting: Registered Nurse

## 2014-11-09 ENCOUNTER — Encounter: Payer: Medicare HMO | Attending: Physical Medicine & Rehabilitation | Admitting: Registered Nurse

## 2014-11-09 VITALS — BP 129/75 | HR 100

## 2014-11-09 DIAGNOSIS — Z5181 Encounter for therapeutic drug level monitoring: Secondary | ICD-10-CM

## 2014-11-09 DIAGNOSIS — M792 Neuralgia and neuritis, unspecified: Secondary | ICD-10-CM | POA: Diagnosis present

## 2014-11-09 DIAGNOSIS — M545 Low back pain, unspecified: Secondary | ICD-10-CM

## 2014-11-09 DIAGNOSIS — G35 Multiple sclerosis: Secondary | ICD-10-CM | POA: Diagnosis present

## 2014-11-09 DIAGNOSIS — G8929 Other chronic pain: Secondary | ICD-10-CM

## 2014-11-09 DIAGNOSIS — G894 Chronic pain syndrome: Secondary | ICD-10-CM

## 2014-11-09 DIAGNOSIS — Z79899 Other long term (current) drug therapy: Secondary | ICD-10-CM

## 2014-11-09 DIAGNOSIS — M4316 Spondylolisthesis, lumbar region: Secondary | ICD-10-CM

## 2014-11-09 MED ORDER — OXYCODONE-ACETAMINOPHEN 10-325 MG PO TABS: 1.0000 | ORAL_TABLET | Freq: Three times a day (TID) | ORAL | Status: DC | PRN

## 2014-11-09 MED ORDER — METHYLPREDNISOLONE 4 MG PO TBPK: ORAL_TABLET | ORAL | Status: DC

## 2014-11-09 NOTE — Progress Notes (Signed)
Subjective:    Patient ID: Mariah Morris, female    DOB: Jul 04, 1970, 44 y.o.   MRN: [id number]  HPI: [name] is a 44 year old female who returns for follow up for chronic pain and medication refill. She says her pain is located in her bilateral shoulders and lower back. Also states her pain has intensified in her lower back  will order Medrol Dose Pak and Oxycodone increase to 100 tablets this month.. She verbalizes understanding.  She rates her pain 7 Her current exercise regime is walking and performing chair exercises.  Pain Inventory Average Pain 7 Pain Right Now 7 My pain is constant, sharp, dull, stabbing and aching  In the last 24 hours, has pain interfered with the following? General activity 5 Relation with others 5 Enjoyment of life 5 What TIME of day is your pain at its worst? evening and night Sleep (in general) Fair  Pain is worse with: walking, bending, sitting, standing and some activites Pain improves with: rest, heat/ice, therapy/exercise and medication Relief from Meds: 7  Mobility walk without assistance  Function Do you have any goals in this area?  no  Neuro/Psych trouble walking spasms confusion depression anxiety  Prior Studies Any changes since last visit?  no  Physicians involved in your care Any changes since last visit?  no   Family History  Problem Relation Age of Onset  . Hypertension Mother   . Diabetes Father   . Hypertension Father    Social History   Social History  . Marital Status: Single    Spouse Name: N/A  . Number of Children: N/A  . Years of Education: N/A   Social History Main Topics  . Smoking status: Never Smoker   . Smokeless tobacco: Never Used  . Alcohol Use: None  . Drug Use: None  . Sexual Activity: Not Asked   Other Topics Concern  . None   Social History Narrative   Past Surgical History  Procedure Laterality Date  . Ovarian cyst removal    . Cesarean section      x2   Past  Medical History  Diagnosis Date  . Neuromuscular disorder (HCC) 2011    MS  . Hypertension   . GERD (gastroesophageal reflux disease)   . Gestational diabetes   . Seizures (HCC)   . Presence of permanent cardiac pacemaker    BP 129/75 mmHg  Pulse 100  SpO2 99%  Opioid Risk Score:   Fall Risk Score:  `1  Depression screen PHQ 2/9  Depression screen [facility] 2/9 10/12/2014 08/21/2014 04/28/2014  Decreased Interest Down, Depressed, Hopeless PHQ - 2 Score Altered sleeping - - 2  Tired, decreased energy - - 3  Change in appetite - - 3  Feeling bad or failure about yourself  - - 0  Trouble concentrating - - 3  Moving slowly or fidgety/restless - - 0  Suicidal thoughts - - 0  PHQ-9 Score - - 13    Review of Systems  Musculoskeletal: Positive for gait problem.       Spasms  Psychiatric/Behavioral: Positive for confusion and dysphoric mood. The patient is nervous/anxious.   All other systems reviewed and are negative.      Objective:   Physical Exam  Constitutional: She is oriented to person, place, and time. She appears well-developed and well-nourished.  HENT:  Head: Normocephalic and atraumatic.  Neck: Normal range of motion.  Neck supple.  Cardiovascular: Normal rate and regular rhythm.   Pulmonary/Chest: Effort normal and breath sounds normal.  Musculoskeletal:  Normal Muscle Bulk and Muscle Testing Reveals: Upper Extremities: Full ROM and Muscle Strength 5/5 Thoracic Paraspinal Tenderness: T-10- T-12 Lumbar Paraspinal Tenderness: L-3- L-5 Lower Extremities: Decreased ROM and Muscle Strength 5/5 Bilateral Lower Extremities Flexion Produces pain into Lumbar and Bilateral Hips Arises from chair slowly Antalgic gait  Neurological: She is alert and oriented to person, place, and time.  Skin: Skin is warm and dry.  Psychiatric: She has a normal mood and affect.  Nursing note and vitals reviewed.         Assessment & Plan:  1.Multiple Sclerosis:  Neurology Following: Dr. Sherryll Burger  2. Low back pain: Continue Current exercise Regime. Continue using heat therapy.  Refilled: OxyCODONE 10/325 mg one tablet every 8 hours as need for pain. Increase to # 100. May take an extra tablet when pain is sever no more than 4 a day. 3. Acute Exacerbation of Chronic Low Back Pain: RX: Medrol Dose Pak 4. Muscle Spasms: Continue Tizanidine 4 mg one- two tablet's three times a day. 5. Depression/anxiety:Psychiatry Following Dr. Suzie Portela: Continue Monthly Counseling. Continue Current Medication Regime. Xanax and Cymbalta 6. Morbid obesity:Encouraged to continue with weight Loss and Healthy Living Regimen. 7. Insomnia: No Complaints Today: Continue Sleepy-time Tea. 8.Greater Trochanteric Bursitis: Continue Heat and Ice Therapy   20 minutes of face to face patient care time was spent during this visit. All questions were encouraged and answered.   F/U in 1 month

## 2014-12-02 ENCOUNTER — Encounter: Payer: Self-pay | Admitting: Physical Medicine & Rehabilitation

## 2014-12-02 ENCOUNTER — Encounter: Payer: Medicare HMO | Attending: Physical Medicine & Rehabilitation | Admitting: Physical Medicine & Rehabilitation

## 2014-12-02 VITALS — BP 154/81 | HR 104 | Resp 16

## 2014-12-02 DIAGNOSIS — M545 Low back pain: Secondary | ICD-10-CM | POA: Diagnosis present

## 2014-12-02 DIAGNOSIS — G35 Multiple sclerosis: Secondary | ICD-10-CM | POA: Diagnosis present

## 2014-12-02 DIAGNOSIS — M792 Neuralgia and neuritis, unspecified: Secondary | ICD-10-CM | POA: Insufficient documentation

## 2014-12-02 DIAGNOSIS — R2689 Other abnormalities of gait and mobility: Secondary | ICD-10-CM | POA: Diagnosis not present

## 2014-12-02 DIAGNOSIS — G4700 Insomnia, unspecified: Secondary | ICD-10-CM

## 2014-12-02 DIAGNOSIS — M4316 Spondylolisthesis, lumbar region: Secondary | ICD-10-CM | POA: Diagnosis not present

## 2014-12-02 MED ORDER — TRAZODONE HCL 100 MG PO TABS: 100.0000 mg | ORAL_TABLET | Freq: Every evening | ORAL | Status: DC | PRN

## 2014-12-02 MED ORDER — TRAZODONE HCL 100 MG PO TABS: 100.0000 mg | ORAL_TABLET | Freq: Every day | ORAL | Status: DC

## 2014-12-02 MED ORDER — OXYCODONE-ACETAMINOPHEN 10-325 MG PO TABS: 1.0000 | ORAL_TABLET | Freq: Three times a day (TID) | ORAL | Status: DC | PRN

## 2014-12-02 MED ORDER — BACLOFEN 10 MG PO TABS: 10.0000 mg | ORAL_TABLET | Freq: Three times a day (TID) | ORAL | Status: DC

## 2014-12-02 NOTE — Patient Instructions (Signed)
WORK ON WAYS TO HELP MANAGE YOUR STRESS.   CONTINUE YOUR WALKING AND STRETCHING  PLEASE CALL ME WITH ANY PROBLEMS OR QUESTIONS (#[phone]).  HAVE A GOOD DAY!

## 2014-12-02 NOTE — Progress Notes (Signed)
Subjective:    Patient ID: Mariah Morris, female    DOB: 01/02/1971, 44 y.o.   MRN: [id number]  HPI   Mariah Morris is here in follow up of her chronic pain and MS. She has been active this summer trying to exercise and stretch more. She has felt that her back and shoulders have tightened with the change to cooler weather. She has tried to stretch to keep from being so stiff. She is walking about 30 minutes per day on most days.   She disclosed to me that she is going through a lot of stress. Her father has been very ill.   Pain Inventory Average Pain 8 Pain Right Now 8 My pain is constant, sharp, stabbing and aching  In the last 24 hours, has pain interfered with the following? General activity 6 Relation with others 6 Enjoyment of life 6 What TIME of day is your pain at its worst? Evening and Night Sleep (in general) Fair  Pain is worse with: walking, bending, sitting and inactivity Pain improves with: rest, heat/ice and medication Relief from Meds: 7  Mobility Do you have any goals in this area?  no  Function Do you have any goals in this area?  no  Neuro/Psych trouble walking confusion anxiety  Prior Studies Any changes since last visit?  no  Physicians involved in your care Any changes since last visit?  no   Family History  Problem Relation Age of Onset  . Hypertension Mother   . Diabetes Father   . Hypertension Father    Social History   Social History  . Marital Status: Single    Spouse Name: N/A  . Number of Children: N/A  . Years of Education: N/A   Social History Main Topics  . Smoking status: Never Smoker   . Smokeless tobacco: Never Used  . Alcohol Use: None  . Drug Use: None  . Sexual Activity: Not Asked   Other Topics Concern  . None   Social History Narrative   Past Surgical History  Procedure Laterality Date  . Ovarian cyst removal    . Cesarean section      x2   Past Medical History  Diagnosis Date  . Neuromuscular  disorder (HCC) 2011    MS  . Hypertension   . GERD (gastroesophageal reflux disease)   . Gestational diabetes   . Seizures (HCC)   . Presence of permanent cardiac pacemaker    BP 154/81 mmHg  Pulse 104  Resp 16  SpO2 100%  Opioid Risk Score:   Fall Risk Score:  `1  Depression screen PHQ 2/9  Depression screen [facility] 2/9 10/12/2014 08/21/2014 04/28/2014  Decreased Interest Down, Depressed, Hopeless PHQ - 2 Score Altered sleeping - - 2  Tired, decreased energy - - 3  Change in appetite - - 3  Feeling bad or failure about yourself  - - 0  Trouble concentrating - - 3  Moving slowly or fidgety/restless - - 0  Suicidal thoughts - - 0  PHQ-9 Score - - 13     Review of Systems  Neurological:       Gait Instability  Psychiatric/Behavioral: Positive for confusion. The patient is nervous/anxious.   All other systems reviewed and are negative.      Objective:   Physical Exam  Constitutional: She is oriented to person, place, and time. She appears well-developed and well-nourished. IN distress Morbidly  obese  HENT:  Head: Normocephalic.  Neck: Neck supple.  Musculoskeletal: She exhibits tenderness. In all limbs (no allodynia but hypersensitive), low back tender also. Neurological: She is alert and oriented to person, place, and time.  Skin: Skin is warm and dry.  Psychiatric: She has a normal mood and affect.  Symmetric normal motor tone is noted throughout. Normal muscle bulk. Muscle testing reveals 4-5/5 muscle strength of the upper extremity, and 4- to 5/5 of the lower extremity. (proximally limited due to pain) limited range of motion in upper and lower extremities.  Sensory is decreased to light touch, on the right.  DTR in the upper and lower extremity are present and symmetric 2+ except right patella 3+. No clonus is noted.  She struggles with extension still---cannot extend past -20 ext.  . Low back remains very tender to palpation. Gait wide based.   .    Assessment:  1. MS-relaxing remitting  2. Low back pain, with documented Grade 1 spondylolisthesis of L4 on L5, With maladaptive posture  3. Right sided pain, neck, shoulder back and leg in a diffuse, non radicular pattern, most likely MS related  4. Depression/anxiety  5. Morbid obesity   Plan:  1. Continue attempts with diet and exercise modification. 2. For leg symptoms, continue gabapentin at 600mg  TID (2 doses in the evening --dinner and bedtime--- to help with sleep)  3. Trazodone--resume for sleep---100mg  qhs 4. Zanaflex 4mg  TID. Resume baclofen 10mg  TID  (CAN TAKE BOTH TOGETHER) 5. Percocet continues at  7.5 q8 prn. #100---refilled today  6. Will send to [facility] for a lumbar extension brace. Not sure what will actually work  with her size. 7. Follow up with me or NP in About a month. 30 minutes of face to face patient care time were spent during this visit. All questions were encouraged and answered.

## 2014-12-07 ENCOUNTER — Telehealth: Payer: Self-pay | Admitting: Physical Medicine & Rehabilitation

## 2014-12-07 NOTE — Telephone Encounter (Signed)
LMTCB

## 2014-12-07 NOTE — Telephone Encounter (Signed)
Went to pharmacy to pick up prescription for Trazodone and it was never sent over to her pharmacy.  Please call patient.

## 2014-12-21 DIAGNOSIS — I10 Essential (primary) hypertension: Secondary | ICD-10-CM | POA: Diagnosis not present

## 2014-12-21 DIAGNOSIS — D649 Anemia, unspecified: Secondary | ICD-10-CM | POA: Diagnosis not present

## 2014-12-21 DIAGNOSIS — E119 Type 2 diabetes mellitus without complications: Secondary | ICD-10-CM | POA: Diagnosis not present

## 2014-12-21 DIAGNOSIS — R946 Abnormal results of thyroid function studies: Secondary | ICD-10-CM | POA: Diagnosis not present

## 2015-01-01 ENCOUNTER — Encounter: Payer: Medicare HMO | Attending: Physical Medicine & Rehabilitation | Admitting: Registered Nurse

## 2015-01-01 ENCOUNTER — Encounter: Payer: Self-pay | Admitting: Registered Nurse

## 2015-01-01 VITALS — BP 131/76 | HR 73

## 2015-01-01 DIAGNOSIS — M4316 Spondylolisthesis, lumbar region: Secondary | ICD-10-CM | POA: Insufficient documentation

## 2015-01-01 DIAGNOSIS — Z79899 Other long term (current) drug therapy: Secondary | ICD-10-CM

## 2015-01-01 DIAGNOSIS — M545 Low back pain: Secondary | ICD-10-CM | POA: Diagnosis present

## 2015-01-01 DIAGNOSIS — G35 Multiple sclerosis: Secondary | ICD-10-CM | POA: Diagnosis not present

## 2015-01-01 DIAGNOSIS — R2689 Other abnormalities of gait and mobility: Secondary | ICD-10-CM

## 2015-01-01 DIAGNOSIS — G894 Chronic pain syndrome: Secondary | ICD-10-CM | POA: Diagnosis not present

## 2015-01-01 DIAGNOSIS — M792 Neuralgia and neuritis, unspecified: Secondary | ICD-10-CM | POA: Diagnosis present

## 2015-01-01 DIAGNOSIS — G4700 Insomnia, unspecified: Secondary | ICD-10-CM

## 2015-01-01 DIAGNOSIS — Z5181 Encounter for therapeutic drug level monitoring: Secondary | ICD-10-CM

## 2015-01-01 MED ORDER — OXYCODONE-ACETAMINOPHEN 10-325 MG PO TABS: 1.0000 | ORAL_TABLET | Freq: Four times a day (QID) | ORAL | Status: DC | PRN

## 2015-01-01 MED ORDER — OXYCODONE-ACETAMINOPHEN 10-325 MG PO TABS: 1.0000 | ORAL_TABLET | Freq: Three times a day (TID) | ORAL | Status: DC | PRN

## 2015-01-01 MED ORDER — TIZANIDINE HCL 4 MG PO TABS: 4.0000 mg | ORAL_TABLET | Freq: Three times a day (TID) | ORAL | Status: DC

## 2015-01-01 MED ORDER — OXYCODONE-ACETAMINOPHEN 10-325 MG PO TABS
1.0000 | ORAL_TABLET | Freq: Four times a day (QID) | ORAL | Status: DC | PRN
Start: 2015-01-01 — End: 2015-01-01

## 2015-01-01 NOTE — Progress Notes (Signed)
Subjective:    Patient ID: Mariah Morris, female    DOB: [birth year], 44 y.o.   MRN: [id number]  HPI: [name] is a 44 year old female who returns for follow up for chronic pain and medication refill. She says her pain is located in her lower back radiating into her right lower extremity posteriorly. Also states with weather changing her pain has intensified will change her frequency to every 6 hours she verbalizes understanding. She rates her pain 9. Her current exercise regime is walking and performing stretching exercises.  Pain Inventory Average Pain 9 Pain Right Now 9 My pain is intermittent, constant, sharp, stabbing and aching  In the last 24 hours, has pain interfered with the following? General activity 5 Relation with others 5 Enjoyment of life 5 What TIME of day is your pain at its worst? Evening and Night Sleep (in general) Fair  Pain is worse with: walking, bending, sitting and inactivity Pain improves with: rest, heat/ice and medication Relief from Meds: 8  Mobility Do you have any goals in this area?  no  Function Do you have any goals in this area?  no  Neuro/Psych weakness trouble walking spasms confusion depression anxiety  Prior Studies Any changes since last visit?  no  Physicians involved in your care Any changes since last visit?  no   Family History  Problem Relation Age of Onset  . Hypertension Mother   . Diabetes Father   . Hypertension Father    Social History   Social History  . Marital Status: Single    Spouse Name: N/A  . Number of Children: N/A  . Years of Education: N/A   Social History Main Topics  . Smoking status: Never Smoker   . Smokeless tobacco: Never Used  . Alcohol Use: None  . Drug Use: None  . Sexual Activity: Not Asked   Other Topics Concern  . None   Social History Narrative   Past Surgical History  Procedure Laterality Date  . Ovarian cyst removal    . Cesarean section      x2    Past Medical History  Diagnosis Date  . Neuromuscular disorder (HCC) 2011    MS  . Hypertension   . GERD (gastroesophageal reflux disease)   . Gestational diabetes   . Seizures (HCC)   . Presence of permanent cardiac pacemaker    BP 131/76 mmHg  Pulse 73  SpO2 100%  Opioid Risk Score:   Fall Risk Score:  `1  Depression screen PHQ 2/9  Depression screen [facility] 2/9 10/12/2014 08/21/2014 04/28/2014  Decreased Interest Down, Depressed, Hopeless PHQ - 2 Score Altered sleeping - - 2  Tired, decreased energy - - 3  Change in appetite - - 3  Feeling bad or failure about yourself  - - 0  Trouble concentrating - - 3  Moving slowly or fidgety/restless - - 0  Suicidal thoughts - - 0  PHQ-9 Score - - 13     Review of Systems  Musculoskeletal: Positive for gait problem.       Spasms  Neurological: Positive for weakness.  Psychiatric/Behavioral: Positive for confusion. The patient is nervous/anxious.        Depression  All other systems reviewed and are negative.      Objective:   Physical Exam  Constitutional: She is oriented to person, place, and time. She appears well-developed and well-nourished.  HENT:  Head: Normocephalic and atraumatic.  Neck: Normal range of motion. Neck supple.  Cardiovascular: Normal rate and regular rhythm.   Pulmonary/Chest: Effort normal and breath sounds normal.  Musculoskeletal:  Normal Muscle Bulk and Muscle Testing Reveals: Upper Extremities: Full ROM and Muscle Strength 5/5 Thoracic Hypersensitivity: T-2- T-5 Lumbar Paraspinal Tenderness: L-3- L-5 Lower Extremities: Full ROM and Muscle Strength 5/5 Left Lower Extremity Flexion Produces Pain into Left Lower Extremity Arises from chair slowly Antalgic gait    Neurological: She is alert and oriented to person, place, and time.  Skin: Skin is warm and dry.  Psychiatric: She has a normal mood and affect.  Nursing note and vitals reviewed.         Assessment &  Plan:  1.Multiple Sclerosis: Neurology Following: Dr. Sherryll Burger  2. Low back pain: Continue Current exercise Regime. Continue using heat therapy.Waiting on Lumbar Extension Brace. Office Staff will call [employer] Today  Refilled: Increased OxyCODONE 10/325 mg one tablet every 6 hours as need for pain.# 120.Second script given to accommodate scheduled appointment. 3. Muscle Spasms: Continue Tizanidine and Baclofen 4. Depression/anxiety:Psychiatry Following Dr. Suzie Portela: Continue Monthly Counseling. Continue Current Medication Regime. Xanax and Cymbalta 5. Morbid obesity:Encouraged to continue with weight Loss and Healthy Living Regimen. 6. Insomnia: No Complaints Today: Continue Trazodone.   20 minutes of face to face patient care time was spent during this visit. All questions were encouraged and answered.   F/U in 1 month

## 2015-01-12 ENCOUNTER — Telehealth: Payer: Self-pay | Admitting: Physical Medicine & Rehabilitation

## 2015-01-12 DIAGNOSIS — M4316 Spondylolisthesis, lumbar region: Secondary | ICD-10-CM

## 2015-01-12 DIAGNOSIS — M62838 Other muscle spasm: Secondary | ICD-10-CM

## 2015-01-12 NOTE — Telephone Encounter (Signed)
Please advise---calling about her back brace. Can you write the specifics on a [facility]?

## 2015-01-12 NOTE — Telephone Encounter (Signed)
Per Dr Riley Kill office note on 12/02/14 it is mentioned for a back brace, I do not see an order/ referral being entered. Please enter order and I will route to [employer].

## 2015-01-13 NOTE — Telephone Encounter (Signed)
An rx was given to the patient at her visit. She must have misplaced it. A new order on epic was written

## 2015-01-13 NOTE — Telephone Encounter (Signed)
Attempted to reach [facility] but both numbers are not in service. Dr Riley Kill entered an order in epic.

## 2015-01-15 ENCOUNTER — Telehealth: Payer: Self-pay | Admitting: *Deleted

## 2015-01-15 NOTE — Telephone Encounter (Signed)
New home number for [facility]

## 2015-01-15 NOTE — Telephone Encounter (Signed)
Ms. Buckmaster called stating her pain has intensifies with weather changes. She was asking if she could get an early refill on 01/22/2015. This will be 8 day's early. I had increased her medication on 01/01/2015, educated on compliance. Instructed to incorporate her anti-inflammatories, Zanaflex and heat in her treatment regimen. Also instructed she needs to be compliant with medication regimen. Self administration of medication can lead to discharge she verbalizes understanding. I will allow her to pick her medication up on 01/27/2015. She verbalizes understanding.

## 2015-01-27 ENCOUNTER — Telehealth: Payer: Self-pay

## 2015-01-27 NOTE — Telephone Encounter (Signed)
Pt called stating that you gave her the okay to fill her Oxycodone early today, instead of tomorrow. Please advise on this? If so, we need to contact pharmacy to let them know of this approval. Thanks!

## 2015-01-27 NOTE — Telephone Encounter (Signed)
I spoke to [name] earlier and spoke with Pharmacist to fill prescription.

## 2015-02-04 ENCOUNTER — Emergency Department
Admission: EM | Admit: 2015-02-04 | Discharge: 2015-02-04 | Disposition: A | Discharge status: Home / Self Care | Payer: No Typology Code available for payment source | Attending: Emergency Medicine | Admitting: Emergency Medicine

## 2015-02-04 ENCOUNTER — Emergency Department: Payer: No Typology Code available for payment source

## 2015-02-04 ENCOUNTER — Encounter: Payer: Self-pay | Admitting: Medical Oncology

## 2015-02-04 DIAGNOSIS — Z79899 Other long term (current) drug therapy: Secondary | ICD-10-CM | POA: Insufficient documentation

## 2015-02-04 DIAGNOSIS — Y92410 Unspecified street and highway as the place of occurrence of the external cause: Secondary | ICD-10-CM | POA: Insufficient documentation

## 2015-02-04 DIAGNOSIS — S3992XA Unspecified injury of lower back, initial encounter: Secondary | ICD-10-CM | POA: Diagnosis not present

## 2015-02-04 DIAGNOSIS — S0990XA Unspecified injury of head, initial encounter: Secondary | ICD-10-CM | POA: Insufficient documentation

## 2015-02-04 DIAGNOSIS — Y998 Other external cause status: Secondary | ICD-10-CM | POA: Diagnosis not present

## 2015-02-04 DIAGNOSIS — Y9389 Activity, other specified: Secondary | ICD-10-CM | POA: Diagnosis not present

## 2015-02-04 DIAGNOSIS — E119 Type 2 diabetes mellitus without complications: Secondary | ICD-10-CM | POA: Diagnosis not present

## 2015-02-04 DIAGNOSIS — I10 Essential (primary) hypertension: Secondary | ICD-10-CM | POA: Diagnosis not present

## 2015-02-04 DIAGNOSIS — S199XXA Unspecified injury of neck, initial encounter: Secondary | ICD-10-CM | POA: Insufficient documentation

## 2015-02-04 DIAGNOSIS — M542 Cervicalgia: Secondary | ICD-10-CM

## 2015-02-04 MED ORDER — ACETAMINOPHEN 500 MG PO TABS
1000.0000 mg | ORAL_TABLET | Freq: Once | ORAL | Status: AC
  Administered 2015-02-04: 1000 mg via ORAL

## 2015-02-04 MED ORDER — CYCLOBENZAPRINE HCL 10 MG PO TABS: 10.0000 mg | ORAL_TABLET | Freq: Three times a day (TID) | ORAL | Status: DC | PRN

## 2015-02-04 MED ORDER — ACETAMINOPHEN 500 MG PO TABS
ORAL_TABLET | ORAL | Status: AC
  Filled 2015-02-04: qty 2
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
## 2015-02-12 DIAGNOSIS — E119 Type 2 diabetes mellitus without complications: Secondary | ICD-10-CM | POA: Diagnosis not present

## 2015-02-12 DIAGNOSIS — R7309 Other abnormal glucose: Secondary | ICD-10-CM | POA: Diagnosis not present

## 2015-02-12 DIAGNOSIS — Z1389 Encounter for screening for other disorder: Secondary | ICD-10-CM | POA: Diagnosis not present

## 2015-02-12 DIAGNOSIS — B379 Candidiasis, unspecified: Secondary | ICD-10-CM | POA: Diagnosis not present

## 2015-02-12 DIAGNOSIS — N390 Urinary tract infection, site not specified: Secondary | ICD-10-CM | POA: Diagnosis not present

## 2015-02-15 ENCOUNTER — Encounter: Payer: Self-pay | Admitting: Registered Nurse

## 2015-02-15 ENCOUNTER — Encounter: Payer: Medicare HMO | Attending: Physical Medicine & Rehabilitation | Admitting: Registered Nurse

## 2015-02-15 VITALS — BP 121/68 | HR 102

## 2015-02-15 DIAGNOSIS — G894 Chronic pain syndrome: Secondary | ICD-10-CM

## 2015-02-15 DIAGNOSIS — M545 Low back pain: Secondary | ICD-10-CM | POA: Diagnosis present

## 2015-02-15 DIAGNOSIS — M6249 Contracture of muscle, multiple sites: Secondary | ICD-10-CM | POA: Diagnosis not present

## 2015-02-15 DIAGNOSIS — Z79899 Other long term (current) drug therapy: Secondary | ICD-10-CM

## 2015-02-15 DIAGNOSIS — M4316 Spondylolisthesis, lumbar region: Secondary | ICD-10-CM | POA: Diagnosis not present

## 2015-02-15 DIAGNOSIS — M792 Neuralgia and neuritis, unspecified: Secondary | ICD-10-CM | POA: Insufficient documentation

## 2015-02-15 DIAGNOSIS — G35 Multiple sclerosis: Secondary | ICD-10-CM

## 2015-02-15 DIAGNOSIS — Z5181 Encounter for therapeutic drug level monitoring: Secondary | ICD-10-CM

## 2015-02-15 DIAGNOSIS — M62838 Other muscle spasm: Secondary | ICD-10-CM

## 2015-02-15 MED ORDER — OXYCODONE-ACETAMINOPHEN 10-325 MG PO TABS: 1.0000 | ORAL_TABLET | Freq: Four times a day (QID) | ORAL | Status: DC | PRN
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
## 2015-03-22 ENCOUNTER — Encounter: Payer: Medicare HMO | Admitting: Registered Nurse

## 2015-03-23 ENCOUNTER — Encounter: Payer: Medicare HMO | Attending: Physical Medicine & Rehabilitation | Admitting: Registered Nurse

## 2015-03-23 ENCOUNTER — Encounter: Payer: Self-pay | Admitting: Registered Nurse

## 2015-03-23 VITALS — BP 153/96 | HR 101

## 2015-03-23 DIAGNOSIS — M792 Neuralgia and neuritis, unspecified: Secondary | ICD-10-CM | POA: Insufficient documentation

## 2015-03-23 DIAGNOSIS — M4316 Spondylolisthesis, lumbar region: Secondary | ICD-10-CM | POA: Insufficient documentation

## 2015-03-23 DIAGNOSIS — Z79899 Other long term (current) drug therapy: Secondary | ICD-10-CM

## 2015-03-23 DIAGNOSIS — Z5181 Encounter for therapeutic drug level monitoring: Secondary | ICD-10-CM

## 2015-03-23 DIAGNOSIS — G35 Multiple sclerosis: Secondary | ICD-10-CM | POA: Insufficient documentation

## 2015-03-23 DIAGNOSIS — M6249 Contracture of muscle, multiple sites: Secondary | ICD-10-CM | POA: Diagnosis not present

## 2015-03-23 DIAGNOSIS — M545 Low back pain: Secondary | ICD-10-CM | POA: Insufficient documentation

## 2015-03-23 DIAGNOSIS — M62838 Other muscle spasm: Secondary | ICD-10-CM

## 2015-03-23 DIAGNOSIS — G894 Chronic pain syndrome: Secondary | ICD-10-CM

## 2015-03-23 MED ORDER — TIZANIDINE HCL 4 MG PO TABS: 4.0000 mg | ORAL_TABLET | Freq: Three times a day (TID) | ORAL | Status: DC

## 2015-03-23 MED ORDER — OXYCODONE-ACETAMINOPHEN 10-325 MG PO TABS: 1.0000 | ORAL_TABLET | Freq: Four times a day (QID) | ORAL | Status: DC | PRN

## 2015-03-23 NOTE — Progress Notes (Signed)
Subjective:    Patient ID: Mariah Morris, female    DOB: 01/06/1971, 45 y.o.   MRN: [id number]  HPI: [name] is a 45 year old female who returns for follow up for chronic pain and medication refill. She says her pain is located in her upper- lower back. She rates her pain 8. Her current exercise regime is walking and performing stretching exercises.  Pain Inventory Average Pain 8 Pain Right Now 8 My pain is constant, stabbing and aching  In the last 24 hours, has pain interfered with the following? General activity 6 Relation with others 6 Enjoyment of life 6 What TIME of day is your pain at its worst? evening Sleep (in general) Fair  Pain is worse with: walking, bending, sitting and standing Pain improves with: rest, heat/ice and medication Relief from Meds: 8  Mobility Do you have any goals in this area?  no  Function Do you have any goals in this area?  no  Neuro/Psych trouble walking spasms confusion anxiety  Prior Studies Any changes since last visit?  no  Physicians involved in your care Any changes since last visit?  no   Family History  Problem Relation Age of Onset  . Hypertension Mother   . Diabetes Father   . Hypertension Father    Social History   Social History  . Marital Status: Single    Spouse Name: N/A  . Number of Children: N/A  . Years of Education: N/A   Social History Main Topics  . Smoking status: Never Smoker   . Smokeless tobacco: Never Used  . Alcohol Use: None  . Drug Use: None  . Sexual Activity: Not Asked   Other Topics Concern  . None   Social History Narrative   Past Surgical History  Procedure Laterality Date  . Ovarian cyst removal    . Cesarean section      x2   Past Medical History  Diagnosis Date  . Neuromuscular disorder (HCC) 2011    MS  . Hypertension   . GERD (gastroesophageal reflux disease)   . Gestational diabetes   . Seizures (HCC)   . Presence of permanent cardiac  pacemaker    BP 153/96 mmHg  Pulse 101  SpO2 98%  Opioid Risk Score:   Fall Risk Score:  `1  Depression screen PHQ 2/9  Depression screen [facility] 2/9 02/15/2015 10/12/2014 08/21/2014 04/28/2014  Decreased Interest 1 1 1 1   Down, Depressed, Hopeless 1 1 1 1   PHQ - 2 Score 2 2 2 2   Altered sleeping - - - 2  Tired, decreased energy - - - 3  Change in appetite - - - 3  Feeling bad or failure about yourself  - - - 0  Trouble concentrating - - - 3  Moving slowly or fidgety/restless - - - 0  Suicidal thoughts - - - 0  PHQ-9 Score - - - 13     Review of Systems     Objective:   Physical Exam  Constitutional: She is oriented to person, place, and time. She appears well-developed and well-nourished.  HENT:  Head: Normocephalic and atraumatic.  Neck: Normal range of motion. Neck supple.  Cardiovascular: Normal rate and regular rhythm.   Pulmonary/Chest: Effort normal and breath sounds normal.  Musculoskeletal:  Normal Muscle Bulk and Muscle Testing Reveals: Upper Extremities: Full ROM and Muscle Strength 5/5 Thoracic Paraspinal Tenderness: T-1- T-3  T-7-T-9 Lumbar Paraspinal Tenderness: L-3- L-5 Lower Extremities: Full ROM and  Muscle Strength 5/5 Bilateral Lower Extremities Flexion Produces Pain into Bilateral Hips and Bilateral Calves Arises from chair slowly Narrow Based Gait   Neurological: She is alert and oriented to person, place, and time.  Skin: Skin is warm and dry.  Psychiatric: She has a normal mood and affect.  Nursing note and vitals reviewed.         Assessment & Plan:  1.Multiple Sclerosis: Neurology Following: Dr. Sherryll Burger  2. Low back pain: Continue Current exercise Regime. Continue using heat therapy. Refilled: OxyCODONE 10/325 mg one tablet every 6 hours as need for pain.# 120 3. Muscle Spasms: Continue to alternate Tizanidine and Baclofen 4. Depression/anxiety:Psychiatry Following Dr. Suzie Portela: Continue Monthly Counseling. Continue Current Medication Regime.  Xanax and Cymbalta 5. Morbid obesity:Encouraged to continue with weight Loss and Healthy Living Regimen. 6. Insomnia: No Complaints Today: Continue Ambien.   20 minutes of face to face patient care time was spent during this visit. All questions were encouraged and answered.   F/U in 1 month

## 2015-03-27 LAB — TOXASSURE SELECT,+ANTIDEPR,UR: PDF: 0

## 2015-03-31 NOTE — Progress Notes (Signed)
Urine drug screen for this encounter is consistent for prescribed medication.  Creatinine is very low.

## 2015-04-20 ENCOUNTER — Encounter: Payer: Medicare HMO | Attending: Physical Medicine & Rehabilitation | Admitting: Registered Nurse

## 2015-04-20 ENCOUNTER — Encounter: Payer: Self-pay | Admitting: Registered Nurse

## 2015-04-20 VITALS — BP 143/83 | HR 98

## 2015-04-20 DIAGNOSIS — M6249 Contracture of muscle, multiple sites: Secondary | ICD-10-CM

## 2015-04-20 DIAGNOSIS — Z79899 Other long term (current) drug therapy: Secondary | ICD-10-CM

## 2015-04-20 DIAGNOSIS — M792 Neuralgia and neuritis, unspecified: Secondary | ICD-10-CM | POA: Diagnosis present

## 2015-04-20 DIAGNOSIS — Z5181 Encounter for therapeutic drug level monitoring: Secondary | ICD-10-CM

## 2015-04-20 DIAGNOSIS — M4316 Spondylolisthesis, lumbar region: Secondary | ICD-10-CM

## 2015-04-20 DIAGNOSIS — G894 Chronic pain syndrome: Secondary | ICD-10-CM

## 2015-04-20 DIAGNOSIS — M545 Low back pain: Secondary | ICD-10-CM | POA: Insufficient documentation

## 2015-04-20 DIAGNOSIS — G35 Multiple sclerosis: Secondary | ICD-10-CM | POA: Insufficient documentation

## 2015-04-20 DIAGNOSIS — M62838 Other muscle spasm: Secondary | ICD-10-CM

## 2015-04-20 MED ORDER — OXYCODONE-ACETAMINOPHEN 10-325 MG PO TABS: 1.0000 | ORAL_TABLET | Freq: Four times a day (QID) | ORAL | Status: DC | PRN

## 2015-04-20 NOTE — Progress Notes (Signed)
Subjective:    Patient ID: Mariah Morris, female    DOB: 08-26-1970, 45 y.o.   MRN: [id number]  HPI: [name] is a 45 year old female who returns for follow up for chronic pain and medication refill. She states her pain is located in her lower back radiating into her lower extremities anteriorly. She rates her pain 8. Her current exercise regime is walking and performing stretching exercises.  Pain Inventory Average Pain 8 Pain Right Now 8 My pain is constant, burning, stabbing and aching  In the last 24 hours, has pain interfered with the following? General activity 5 Relation with others 5 Enjoyment of life 5 What TIME of day is your pain at its worst? evening and night Sleep (in general) Fair  Pain is worse with: walking, bending, sitting and standing Pain improves with: rest, heat/ice and medication Relief from Meds: 7  Mobility Do you have any goals in this area?  no  Function Do you have any goals in this area?  no  Neuro/Psych No problems in this area  Prior Studies Any changes since last visit?  no  Physicians involved in your care Any changes since last visit?  no   Family History  Problem Relation Age of Onset  . Hypertension Mother   . Diabetes Father   . Hypertension Father    Social History   Social History  . Marital Status: Single    Spouse Name: N/A  . Number of Children: N/A  . Years of Education: N/A   Social History Main Topics  . Smoking status: Never Smoker   . Smokeless tobacco: Never Used  . Alcohol Use: None  . Drug Use: None  . Sexual Activity: Not Asked   Other Topics Concern  . None   Social History Narrative   Past Surgical History  Procedure Laterality Date  . Ovarian cyst removal    . Cesarean section      x2   Past Medical History  Diagnosis Date  . Neuromuscular disorder (HCC) 2011    MS  . Hypertension   . GERD (gastroesophageal reflux disease)   . Gestational diabetes   . Seizures (HCC)    . Presence of permanent cardiac pacemaker    BP 143/83 mmHg  Pulse 98  SpO2 99%  Opioid Risk Score:   Fall Risk Score:  `1  Depression screen PHQ 2/9  Depression screen [facility] 2/9 02/15/2015 10/12/2014 08/21/2014 04/28/2014  Decreased Interest Down, Depressed, Hopeless PHQ - 2 Score Altered sleeping - - - 2  Tired, decreased energy - - - 3  Change in appetite - - - 3  Feeling bad or failure about yourself  - - - 0  Trouble concentrating - - - 3  Moving slowly or fidgety/restless - - - 0  Suicidal thoughts - - - 0  PHQ-9 Score - - - 13    Review of Systems  All other systems reviewed and are negative.      Objective:   Physical Exam  Constitutional: She is oriented to person, place, and time. She appears well-developed and well-nourished.  HENT:  Head: Normocephalic and atraumatic.  Neck: Normal range of motion. Neck supple.  Cardiovascular: Normal rate and regular rhythm.   Pulmonary/Chest: Effort normal and breath sounds normal.  Musculoskeletal:  Normal Muscle Bulk and Muscle Testing Reveals: Upper Extremities: Full ROM and Muscle Strength 5/5 Lumbar  Paraspinal Tenderness: L-3- L-5 Wearing Back Brace Lower Extremities: Full ROM and Muscle Strength 5/5 Right Lower Extremity Flexion Produces pain into Lower Extremity  Arises from chair slowly Antalgic Gait   Neurological: She is alert and oriented to person, place, and time.  Skin: Skin is warm and dry.  Psychiatric: She has a normal mood and affect.  Nursing note and vitals reviewed.         Assessment & Plan:  1.Multiple Sclerosis: Neurology Following: Dr. Sherryll Burger  2. Low back pain: Continue Current exercise Regime. Continue using heat therapy. Refilled: OxyCODONE 10/325 mg one tablet every 6 hours as need for pain.# 120. Second script given to accommodate scheduled appointment 3. Muscle Spasms: Continue to alternate Tizanidine and Baclofen 4. Depression/anxiety:Psychiatry Following  Dr. Suzie Portela: Continue Monthly Counseling. Continue Current Medication Regime. Xanax and Cymbalta 5. Morbid obesity:Encouraged to continue with weight Loss and Healthy Living Regimen. 6. Insomnia: No Complaints Today: Continue Ambien.   20 minutes of face to face patient care time was spent during this visit. All questions were encouraged and answered.   F/U in 1 month

## 2015-05-25 ENCOUNTER — Encounter: Payer: Self-pay | Admitting: Registered Nurse

## 2015-05-25 ENCOUNTER — Encounter: Payer: Medicare HMO | Attending: Physical Medicine & Rehabilitation | Admitting: Registered Nurse

## 2015-05-25 VITALS — BP 146/87 | HR 79

## 2015-05-25 DIAGNOSIS — M6249 Contracture of muscle, multiple sites: Secondary | ICD-10-CM | POA: Diagnosis not present

## 2015-05-25 DIAGNOSIS — G35 Multiple sclerosis: Secondary | ICD-10-CM | POA: Diagnosis present

## 2015-05-25 DIAGNOSIS — M4316 Spondylolisthesis, lumbar region: Secondary | ICD-10-CM | POA: Insufficient documentation

## 2015-05-25 DIAGNOSIS — G894 Chronic pain syndrome: Secondary | ICD-10-CM

## 2015-05-25 DIAGNOSIS — M545 Low back pain: Secondary | ICD-10-CM | POA: Insufficient documentation

## 2015-05-25 DIAGNOSIS — Z79899 Other long term (current) drug therapy: Secondary | ICD-10-CM

## 2015-05-25 DIAGNOSIS — M62838 Other muscle spasm: Secondary | ICD-10-CM

## 2015-05-25 DIAGNOSIS — M792 Neuralgia and neuritis, unspecified: Secondary | ICD-10-CM | POA: Diagnosis present

## 2015-05-25 DIAGNOSIS — Z5181 Encounter for therapeutic drug level monitoring: Secondary | ICD-10-CM

## 2015-05-25 MED ORDER — OXYCODONE-ACETAMINOPHEN 10-325 MG PO TABS: 1.0000 | ORAL_TABLET | Freq: Four times a day (QID) | ORAL | Status: DC | PRN

## 2015-05-25 MED ORDER — TIZANIDINE HCL 4 MG PO TABS: 4.0000 mg | ORAL_TABLET | Freq: Three times a day (TID) | ORAL | Status: DC

## 2015-05-25 NOTE — Progress Notes (Signed)
Subjective:    Patient ID: Mariah Morris, female    DOB: [birth year], 45 y.o.   MRN: [id number]  HPI: Mariah Morris is a 45 year old female who returns for follow up for chronic pain and medication refill. She states her pain is located in her mid-lower back radiating into her lower extremities posteriorly. Also states she's having muscle spasms and the Tizanidine helps. She rates her pain 7. Her current exercise regime is walking at the park twice a day and performing stretching exercises. Also states she has lost 8 lbs.  Pain Inventory Average Pain 7 Pain Right Now 7 My pain is intermittent, sharp, burning, stabbing and aching  In the last 24 hours, has pain interfered with the following? General activity 6 Relation with others 5 Enjoyment of life 6 What TIME of day is your pain at its worst? Evening and night Sleep (in general) Fair  Pain is worse with: walking, standing and some activites Pain improves with: rest and medication Relief from Meds: 7  Mobility Do you have any goals in this area?  no  Function Do you have any goals in this area?  no  Neuro/Psych spasms confusion anxiety  Prior Studies Any changes since last visit?  no  Physicians involved in your care Any changes since last visit?  no   Family History  Problem Relation Age of Onset  . Hypertension Mother   . Diabetes Father   . Hypertension Father    Social History   Social History  . Marital Status: Single    Spouse Name: N/A  . Number of Children: N/A  . Years of Education: N/A   Social History Main Topics  . Smoking status: Never Smoker   . Smokeless tobacco: Never Used  . Alcohol Use: None  . Drug Use: None  . Sexual Activity: Not Asked   Other Topics Concern  . None   Social History Narrative   Past Surgical History  Procedure Laterality Date  . Ovarian cyst removal    . Cesarean section      x2   Past Medical History  Diagnosis Date  . Neuromuscular  disorder (HCC) 2011    MS  . Hypertension   . GERD (gastroesophageal reflux disease)   . Gestational diabetes   . Seizures (HCC)   . Presence of permanent cardiac pacemaker    BP 146/87 mmHg  Pulse 79  SpO2 97%  Opioid Risk Score:   Fall Risk Score:  `1  Depression screen PHQ 2/9  Depression screen [facility] 2/9 02/15/2015 10/12/2014 08/21/2014 04/28/2014  Decreased Interest Down, Depressed, Hopeless PHQ - 2 Score Altered sleeping - - - 2  Tired, decreased energy - - - 3  Change in appetite - - - 3  Feeling bad or failure about yourself  - - - 0  Trouble concentrating - - - 3  Moving slowly or fidgety/restless - - - 0  Suicidal thoughts - - - 0  PHQ-9 Score - - - 13     Review of Systems     Objective:   Physical Exam  Constitutional: She is oriented to person, place, and time. She appears well-developed and well-nourished.  HENT:  Head: Normocephalic and atraumatic.  Neck: Normal range of motion. Neck supple.  Cardiovascular: Normal rate and regular rhythm.   Pulmonary/Chest: Effort normal and breath sounds normal.  Musculoskeletal:  Normal Muscle Bulk  and muscle Testing Reveals: Upper Extremities: Full ROM and Muscle Strength 5/5 Lumbar Hypersensitivity Wearing Back brace Lower Extremities: Full ROM and Muscle Strength 5/5 Arises from chair slowly Antalgic gait  Neurological: She is alert and oriented to person, place, and time.  Skin: Skin is warm and dry.  Psychiatric: She has a normal mood and affect.  Nursing note and vitals reviewed.         Assessment & Plan:  1.Multiple Sclerosis: Neurology Following: Dr. Sherryll Burger  2. Low back pain: Continue Current exercise Regime. Continue using heat therapy. Refilled: OxyCODONE 10/325 mg one tablet every 6 hours as need for pain.# 120. We will continue the opioid monitoring program, this consists of regular clinic visits, examinations, urine drug screen, pill counts as well use of [place] Controlled Substance reporting System. 3. Muscle Spasms: Continue Tizanidine  4. Depression/anxiety:Psychiatry Following Dr. Suzie Portela: Continue Monthly Counseling. Continue Current Medication Regime. Xanax and Cymbalta 5. Morbid obesity:Encouraged to continue with weight Loss and Healthy Living Regimen. 6. Insomnia: No Complaints Today: Continue Ambien.   20 minutes of face to face patient care time was spent during this visit. All questions were encouraged and answered.   F/U in 1 month

## 2015-06-21 ENCOUNTER — Encounter: Payer: Medicare HMO | Attending: Physical Medicine & Rehabilitation | Admitting: Registered Nurse

## 2015-06-21 ENCOUNTER — Encounter: Payer: Self-pay | Admitting: Registered Nurse

## 2015-06-21 VITALS — BP 149/95 | HR 95 | Resp 15

## 2015-06-21 DIAGNOSIS — M4316 Spondylolisthesis, lumbar region: Secondary | ICD-10-CM | POA: Diagnosis present

## 2015-06-21 DIAGNOSIS — G35D Multiple sclerosis, unspecified: Secondary | ICD-10-CM

## 2015-06-21 DIAGNOSIS — M545 Low back pain, unspecified: Secondary | ICD-10-CM

## 2015-06-21 DIAGNOSIS — M792 Neuralgia and neuritis, unspecified: Secondary | ICD-10-CM

## 2015-06-21 DIAGNOSIS — G35 Multiple sclerosis: Secondary | ICD-10-CM | POA: Diagnosis not present

## 2015-06-21 DIAGNOSIS — G894 Chronic pain syndrome: Secondary | ICD-10-CM

## 2015-06-21 DIAGNOSIS — Z5181 Encounter for therapeutic drug level monitoring: Secondary | ICD-10-CM

## 2015-06-21 DIAGNOSIS — Z79899 Other long term (current) drug therapy: Secondary | ICD-10-CM

## 2015-06-21 DIAGNOSIS — M79604 Pain in right leg: Secondary | ICD-10-CM

## 2015-06-21 MED ORDER — OXYCODONE-ACETAMINOPHEN 10-325 MG PO TABS: 1.0000 | ORAL_TABLET | Freq: Four times a day (QID) | ORAL | Status: DC | PRN

## 2015-06-21 NOTE — Progress Notes (Signed)
Subjective:    Patient ID: Mariah Morris, female    DOB: 02/14/1970, 45 y.o.   MRN: [id number]  HPI: [name] is a 45 year old female who returns for follow up for chronic pain and medication refill. She states her pain is located in her mid-lower back radiating into her lower extremities posteriorly and bilateral knees.  She rates her pain 7. Her current exercise regime is walking at the park twice a day and performing stretching exercises.  Pain Inventory Average Pain 7 Pain Right Now 7 My pain is constant, burning, stabbing and aching  In the last 24 hours, has pain interfered with the following? General activity 6 Relation with others 6 Enjoyment of life 6 What TIME of day is your pain at its worst? evening, night Sleep (in general) Fair  Pain is worse with: walking, bending and standing Pain improves with: rest, pacing activities and medication Relief from Meds: 7  Mobility Do you have any goals in this area?  no  Function Do you have any goals in this area?  no  Neuro/Psych spasms confusion anxiety  Prior Studies Any changes since last visit?  no  Physicians involved in your care Any changes since last visit?  no   Family History  Problem Relation Age of Onset  . Hypertension Mother   . Diabetes Father   . Hypertension Father    Social History   Social History  . Marital Status: Single    Spouse Name: N/A  . Number of Children: N/A  . Years of Education: N/A   Social History Main Topics  . Smoking status: Never Smoker   . Smokeless tobacco: Never Used  . Alcohol Use: None  . Drug Use: None  . Sexual Activity: Not Asked   Other Topics Concern  . None   Social History Narrative   Past Surgical History  Procedure Laterality Date  . Ovarian cyst removal    . Cesarean section      x2   Past Medical History  Diagnosis Date  . Neuromuscular disorder (HCC) 2011    MS  . Hypertension   . GERD (gastroesophageal reflux  disease)   . Gestational diabetes   . Seizures (HCC)   . Presence of permanent cardiac pacemaker    BP 149/95 mmHg  Pulse 95  Resp 15  SpO2 99%  LMP  (Within Weeks)  Opioid Risk Score:   Fall Risk Score:  `1  Depression screen PHQ 2/9  Depression screen [facility] 2/9 02/15/2015 10/12/2014 08/21/2014 04/28/2014  Decreased Interest Down, Depressed, Hopeless PHQ - 2 Score Altered sleeping - - - 2  Tired, decreased energy - - - 3  Change in appetite - - - 3  Feeling bad or failure about yourself  - - - 0  Trouble concentrating - - - 3  Moving slowly or fidgety/restless - - - 0  Suicidal thoughts - - - 0  PHQ-9 Score - - - 13      Review of Systems  Neurological:       Spasms   Psychiatric/Behavioral: Positive for confusion. The patient is nervous/anxious.   All other systems reviewed and are negative.      Objective:   Physical Exam  Constitutional: She is oriented to person, place, and time. She appears well-developed and well-nourished.  HENT:  Head: Normocephalic and atraumatic.  Neck: Normal range of motion.  Neck supple.  Cardiovascular: Normal rate and regular rhythm.   Pulmonary/Chest: Effort normal and breath sounds normal.  Musculoskeletal:  Normal Muscle Bulk and Muscle Testing Reveals: Upper Extremities: Full ROM and Muscle Strength 5/5 Thoracic Paraspinal Tenderness: T-7- T-9 Lumbar Hypersensitivity Lower Extremities: Full ROM and Muscle Strength 5/5 Right Lower Extremity Flexion Produces Pain into Extremity Arises from chair slowly Narrow Based Gait  Neurological: She is alert and oriented to person, place, and time.  Skin: Skin is warm and dry.  Psychiatric: She has a normal mood and affect.  Nursing note and vitals reviewed.         Assessment & Plan:  1.Multiple Sclerosis: Neurology Following: Dr. Sherryll Burger  2. Low back pain: Continue Current exercise Regime. Continue using heat therapy. Refilled: OxyCODONE 10/325 mg one  tablet every 6 hours as need for pain.# 120. We will continue the opioid monitoring program, this consists of regular clinic visits, examinations, urine drug screen, pill counts as well use of [place] Controlled Substance reporting System. 3. Muscle Spasms: Continue Tizanidine  4. Depression/anxiety:Psychiatry Following Dr. Suzie Portela: Continue Monthly Counseling. Continue Current Medication Regime. Xanax and Cymbalta 5. Morbid obesity:Encouraged to continue with weight Loss and Healthy Living Regimen. 6. Insomnia: No Complaints Today: Continue Ambien.   20 minutes of face to face patient care time was spent during this visit. All questions were encouraged and answered.   F/U in 1 month

## 2015-07-19 ENCOUNTER — Encounter: Payer: Medicare HMO | Attending: Physical Medicine & Rehabilitation | Admitting: Physical Medicine & Rehabilitation

## 2015-07-19 ENCOUNTER — Encounter: Payer: Self-pay | Admitting: Physical Medicine & Rehabilitation

## 2015-07-19 VITALS — BP 99/67 | HR 83

## 2015-07-19 DIAGNOSIS — Z5181 Encounter for therapeutic drug level monitoring: Secondary | ICD-10-CM

## 2015-07-19 DIAGNOSIS — M4316 Spondylolisthesis, lumbar region: Secondary | ICD-10-CM | POA: Insufficient documentation

## 2015-07-19 DIAGNOSIS — M792 Neuralgia and neuritis, unspecified: Secondary | ICD-10-CM | POA: Insufficient documentation

## 2015-07-19 DIAGNOSIS — Z79899 Other long term (current) drug therapy: Secondary | ICD-10-CM

## 2015-07-19 DIAGNOSIS — G894 Chronic pain syndrome: Secondary | ICD-10-CM

## 2015-07-19 DIAGNOSIS — M545 Low back pain: Secondary | ICD-10-CM | POA: Diagnosis present

## 2015-07-19 DIAGNOSIS — R2689 Other abnormalities of gait and mobility: Secondary | ICD-10-CM

## 2015-07-19 DIAGNOSIS — G35 Multiple sclerosis: Secondary | ICD-10-CM | POA: Diagnosis present

## 2015-07-19 MED ORDER — OXYCODONE-ACETAMINOPHEN 10-325 MG PO TABS: 1.0000 | ORAL_TABLET | Freq: Four times a day (QID) | ORAL | Status: DC | PRN

## 2015-07-19 MED ORDER — OXYCODONE-ACETAMINOPHEN 10-325 MG PO TABS
1.0000 | ORAL_TABLET | Freq: Four times a day (QID) | ORAL | Status: DC | PRN
Start: 2015-07-19 — End: 2015-09-20

## 2015-07-19 NOTE — Patient Instructions (Addendum)
   SUPPLEMENTS: TURMERIC, GINGER, TART CHERRY EXTRACT---CAN GET THESE ALL IN PILL FORM   PLEASE CALL ME WITH ANY PROBLEMS OR QUESTIONS [phone])

## 2015-07-19 NOTE — Progress Notes (Signed)
Subjective:    Patient ID: Mariah Morris, female    DOB: 1971/01/12, 45 y.o.   MRN: [id number]  HPI   Mariah Morris is here in follow up of her chronic pain. She struggles with her pain but is trying to stay active as she can. She does her stretches daily. She is working on her diet and has made changes. She has dropped fried food and sugared drinks. She has lost 10lbs over the last couple months.   There have been no changes as it pertains to her MS.  She maintains on percocet, gabapentin, tizanidine for pain control.   Pain Inventory Average Pain 7 Pain Right Now 7 My pain is Na  In the last 24 hours, has pain interfered with the following? General activity 5 Relation with others 5 Enjoyment of life 5 What TIME of day is your pain at its worst? evening Sleep (in general) Fair  Pain is worse with: walking, bending, sitting and inactivity Pain improves with: rest, heat/ice, therapy/exercise and medication Relief from Meds: na  Mobility do you drive?  yes Do you have any goals in this area?  no  Function Do you have any goals in this area?  no  Neuro/Psych trouble walking spasms confusion  Prior Studies Any changes since last visit?  no  Physicians involved in your care Any changes since last visit?  no   Family History  Problem Relation Age of Onset  . Hypertension Mother   . Diabetes Father   . Hypertension Father    Social History   Social History  . Marital Status: Single    Spouse Name: N/A  . Number of Children: N/A  . Years of Education: N/A   Social History Main Topics  . Smoking status: Never Smoker   . Smokeless tobacco: Never Used  . Alcohol Use: Not on file  . Drug Use: Not on file  . Sexual Activity: Not on file   Other Topics Concern  . Not on file   Social History Narrative   Past Surgical History  Procedure Laterality Date  . Ovarian cyst removal    . Cesarean section      x2   Past Medical History  Diagnosis Date  .  Neuromuscular disorder (HCC) 2011    MS  . Hypertension   . GERD (gastroesophageal reflux disease)   . Gestational diabetes   . Seizures (HCC)   . Presence of permanent cardiac pacemaker    LMP  (Within Weeks)  Opioid Risk Score:   Fall Risk Score:  `1  Depression screen PHQ 2/9  Depression screen [facility] 2/9 02/15/2015 10/12/2014 08/21/2014 04/28/2014  Decreased Interest Down, Depressed, Hopeless PHQ - 2 Score Altered sleeping - - - 2  Tired, decreased energy - - - 3  Change in appetite - - - 3  Feeling bad or failure about yourself  - - - 0  Trouble concentrating - - - 3  Moving slowly or fidgety/restless - - - 0  Suicidal thoughts - - - 0  PHQ-9 Score - - - 13       Review of Systems  All other systems reviewed and are negative.      Objective:   Physical Exam  Constitutional: She is oriented to person, place, and time. She appears well-developed and well-nourished. IN distress Morbidly obese  HENT:  Head: Normocephalic.  Neck: Neck supple.  Musculoskeletal:  She exhibits tenderness. In all limbs , low back tender also.  Neurological: She is alert and oriented to person, place, and time.  Skin: Skin is warm and dry.  Psychiatric: She has a normal mood and affect.  Symmetric normal motor tone is noted throughout. Normal muscle bulk. Muscle testing reveals 4-5/5 muscle strength of the upper extremity, and 4- to 5/5 of the lower extremity.   Sensory is decreased to light touch, on the right.  DTR in the upper and lower extremity are present and symmetric 2+ except right patella 3+. No clonus is noted.  She struggles with extension still---cannot extend past -15-20 ext.  Low back remains tender to palpation. Gait wide based.      Assessment:  1. MS-relaxing remitting  2. Low back pain, with documented Grade 1 spondylolisthesis of L4 on L5, With maladaptive posture  3. Right sided pain, neck, shoulder back and leg in a diffuse, non radicular  pattern, most likely MS related  4. Depression/anxiety  5. Morbid obesity    Plan:  1. Continue attempts with diet and exercise modification. I made a referral to [facility] for formal counseling. She's on the right track. She needs to stay with the plan and work on a few ways to fine tune her diet. Even if her exercise is not much, she needs to maintain daily activity  2. For leg symptoms, continue gabapentin at 600mg  TID (2 doses in the evening --dinner and bedtime--- to help with sleep)  3. Ambien 4. Zanaflex 4mg  TID.for spasms  5. Percocet continues at 7.5 q8 prn. #100---refilled today with a second rx for next month, 6. Discussed lumbar extension exercises. Pictures were provided 7. Follow up with me or NP in About 2 month. 30 minutes of face to face patient care time were spent during this visit. All questions were encouraged and answered.

## 2015-07-29 LAB — TOXASSURE SELECT,+ANTIDEPR,UR: PDF: 0

## 2015-07-30 NOTE — Progress Notes (Signed)
Urine drug screen for this encounter is consistent for prescribed medications.   

## 2015-07-30 NOTE — Addendum Note (Signed)
Addended by: Kathrine Haddock L on: 07/30/2015 09:11 AM   Modules accepted: Medications

## 2015-09-20 ENCOUNTER — Encounter: Payer: Medicare HMO | Attending: Physical Medicine & Rehabilitation | Admitting: Physical Medicine & Rehabilitation

## 2015-09-20 ENCOUNTER — Encounter: Payer: Self-pay | Admitting: Physical Medicine & Rehabilitation

## 2015-09-20 VITALS — BP 125/85 | HR 98

## 2015-09-20 DIAGNOSIS — M545 Low back pain: Secondary | ICD-10-CM | POA: Insufficient documentation

## 2015-09-20 DIAGNOSIS — Z5181 Encounter for therapeutic drug level monitoring: Secondary | ICD-10-CM

## 2015-09-20 DIAGNOSIS — Z79899 Other long term (current) drug therapy: Secondary | ICD-10-CM

## 2015-09-20 DIAGNOSIS — G894 Chronic pain syndrome: Secondary | ICD-10-CM

## 2015-09-20 DIAGNOSIS — G35 Multiple sclerosis: Secondary | ICD-10-CM | POA: Diagnosis not present

## 2015-09-20 DIAGNOSIS — M4316 Spondylolisthesis, lumbar region: Secondary | ICD-10-CM | POA: Diagnosis not present

## 2015-09-20 DIAGNOSIS — M792 Neuralgia and neuritis, unspecified: Secondary | ICD-10-CM | POA: Diagnosis not present

## 2015-09-20 DIAGNOSIS — R2689 Other abnormalities of gait and mobility: Secondary | ICD-10-CM

## 2015-09-20 MED ORDER — OXYCODONE-ACETAMINOPHEN 10-325 MG PO TABS: 1.0000 | ORAL_TABLET | Freq: Four times a day (QID) | ORAL | 0 refills | Status: DC | PRN

## 2015-09-20 NOTE — Progress Notes (Signed)
Subjective:    Patient ID: Mariah Morris, female    DOB: 1970/10/16, 45 y.o.   MRN: [id number]  HPI   Mariah Morris is here in follow up of her MS and chronic pain. She is still "hurting", but has lost further weight. She is up to 12-13lbs so far. She is walking and working on her stretching. She is exercising every day and hopes to do more once her children go back to school this Fall.   She continues on percocet for pain control which provides some relief.   Pain Inventory Average Pain 8 Pain Right Now 8 My pain is constant, stabbing and aching  In the last 24 hours, has pain interfered with the following? General activity 5 Relation with others 5 Enjoyment of life 5 What TIME of day is your pain at its worst? evening and night Sleep (in general) Fair  Pain is worse with: standing and some activites Pain improves with: rest, heat/ice and medication Relief from Meds: 7  Mobility Do you have any goals in this area?  no  Function Do you have any goals in this area?  no  Neuro/Psych trouble walking spasms confusion anxiety  Prior Studies Any changes since last visit?  no  Physicians involved in your care Any changes since last visit?  no   Family History  Problem Relation Age of Onset  . Hypertension Mother   . Diabetes Father   . Hypertension Father    Social History   Social History  . Marital status: Single    Spouse name: N/A  . Number of children: N/A  . Years of education: N/A   Social History Main Topics  . Smoking status: Never Smoker  . Smokeless tobacco: Never Used  . Alcohol use None  . Drug use: Unknown  . Sexual activity: Not Asked   Other Topics Concern  . None   Social History Narrative  . None   Past Surgical History:  Procedure Laterality Date  . CESAREAN SECTION     x2  . OVARIAN CYST REMOVAL     Past Medical History:  Diagnosis Date  . GERD (gastroesophageal reflux disease)   . Gestational diabetes   . Hypertension    . Neuromuscular disorder (HCC) 2011   MS  . Presence of permanent cardiac pacemaker   . Seizures (HCC)    BP 125/85   Pulse 98   SpO2 97%   Opioid Risk Score:   Fall Risk Score:  `1  Depression screen PHQ 2/9  Depression screen [facility] 2/9 07/19/2015 02/15/2015 10/12/2014 08/21/2014 04/28/2014  Decreased Interest 0 1 1 1 1   Down, Depressed, Hopeless 0 1 1 1 1   PHQ - 2 Score 0 2 2 2 2   Altered sleeping - - - - 2  Tired, decreased energy - - - - 3  Change in appetite - - - - 3  Feeling bad or failure about yourself  - - - - 0  Trouble concentrating - - - - 3  Moving slowly or fidgety/restless - - - - 0  Suicidal thoughts - - - - 0  PHQ-9 Score - - - - 13    Review of Systems  Constitutional: Negative.   HENT: Negative.   Eyes: Negative.   Respiratory: Negative.   Cardiovascular: Negative.   Gastrointestinal: Negative.   Endocrine: Negative.   Genitourinary: Negative.   Musculoskeletal: Positive for back pain.  Skin: Negative.   Allergic/Immunologic: Negative.   Neurological: Negative.  Hematological: Negative.   Psychiatric/Behavioral: Positive for confusion. The patient is nervous/anxious.        Objective:   Physical Exam  Constitutional: She is oriented to person, place, and time. She appears well-developed and well-nourished. IN distress Morbidly obese  HENT:  Head: Normocephalic.  Neck: Neck supple.  Musculoskeletal: She exhibits tenderness. In all limbs , low back tender also.  Neurological: She is alert and oriented to person, place, and time.  Skin: Skin is warm and dry.  Psychiatric: She has a normal mood and affect.  Symmetric normal motor tone is noted throughout. Normal muscle bulk. Muscle testing reveals 4-5/5 muscle strength of the upper extremity, and 4- to 5/5 of the lower extremity.   Sensory is decreased to light touch, on the right.  DTR in the upper and lower extremity are present and symmetric 2+ except right patella 3+. No clonus is noted.    She struggles with extension.   Low back remains tender to palpation. Gait wide based.      Assessment:  1. MS-relaxing remitting  2. Low back pain, with documented Grade 1 spondylolisthesis of L4 on L5, With maladaptive posture  3. Right sided pain, neck, shoulder back and leg in a diffuse, non radicular pattern, most likely MS related  4. Depression/anxiety  5. Morbid obesity    Plan:  1. Continue attempts with diet and exercise modification. She will be talking to a dietician through her primary MD. She doesn't recall getting a call from [facility].  Discussed limitations of carbs.  2. For neuropathic leg pain, continue gabapentin at 600mg  TID (2 doses in the evening --dinner and bedtime--- to help with sleep)  3. Ambien  4. Zanaflex 4mg  TID.for spasms.   5. Percocet continues at 7.5 q8 prn. #100---refilled today with a second rx for next month, 6. Discussed lumbar extension exercises once again. Needs to expand as possible.  7. Follow up with me or NP in About 2 month.  30 minutes of face to face patient care time were spent during this visit. All questions were encouraged and answered.

## 2015-09-20 NOTE — Patient Instructions (Signed)
YOU HAVE TO WATCH YOUR CARB INTAKE.    PLEASE CALL ME WITH ANY PROBLEMS OR QUESTIONS [phone])

## 2015-11-19 ENCOUNTER — Encounter: Payer: Self-pay | Admitting: Registered Nurse

## 2015-11-19 ENCOUNTER — Encounter: Payer: Medicare HMO | Attending: Physical Medicine & Rehabilitation | Admitting: Registered Nurse

## 2015-11-19 VITALS — BP 123/86 | HR 85 | Resp 14

## 2015-11-19 DIAGNOSIS — Z79899 Other long term (current) drug therapy: Secondary | ICD-10-CM

## 2015-11-19 DIAGNOSIS — G35 Multiple sclerosis: Secondary | ICD-10-CM | POA: Diagnosis present

## 2015-11-19 DIAGNOSIS — Z5181 Encounter for therapeutic drug level monitoring: Secondary | ICD-10-CM

## 2015-11-19 DIAGNOSIS — M792 Neuralgia and neuritis, unspecified: Secondary | ICD-10-CM | POA: Diagnosis not present

## 2015-11-19 DIAGNOSIS — R2689 Other abnormalities of gait and mobility: Secondary | ICD-10-CM

## 2015-11-19 DIAGNOSIS — M545 Low back pain: Secondary | ICD-10-CM | POA: Insufficient documentation

## 2015-11-19 DIAGNOSIS — G894 Chronic pain syndrome: Secondary | ICD-10-CM | POA: Diagnosis not present

## 2015-11-19 DIAGNOSIS — M4316 Spondylolisthesis, lumbar region: Secondary | ICD-10-CM

## 2015-11-19 MED ORDER — OXYCODONE-ACETAMINOPHEN 10-325 MG PO TABS: 1.0000 | ORAL_TABLET | Freq: Four times a day (QID) | ORAL | 0 refills | Status: DC | PRN

## 2015-11-19 NOTE — Progress Notes (Signed)
Subjective:    Patient ID: [name], female    DOB: [birth year], 45 y.o.   MRN: [id number]  HPI:Ms. Dawnette Krogh is a 45 year old female who returns for follow up for chronic pain and medication refill. She states her pain is located in her mid-lower back radiating into her lower extremities posteriorly and bilateral knees.  She rates her pain 7. Her current exercise regime is walking at the park twice a day and performing stretching exercises.  Pain Inventory Average Pain 7 Pain Right Now 7 My pain is constant, burning, stabbing, tingling and aching  In the last 24 hours, has pain interfered with the following? General activity 6 Relation with others 6 Enjoyment of life 6 What TIME of day is your pain at its worst? Evening, night Sleep (in general) Fair  Pain is worse with: walking, bending, sitting and standing Pain improves with: rest, heat/ice, therapy/exercise and medication Relief from Meds: 8  Mobility Do you have any goals in this area?  no  Function Do you have any goals in this area?  no  Neuro/Psych tingling trouble walking spasms confusion anxiety  Prior Studies Any changes since last visit?  no  Physicians involved in your care Any changes since last visit?  no   Family History  Problem Relation Age of Onset  . Hypertension Mother   . Diabetes Father   . Hypertension Father    Social History   Social History  . Marital status: Single    Spouse name: N/A  . Number of children: N/A  . Years of education: N/A   Social History Main Topics  . Smoking status: Never Smoker  . Smokeless tobacco: Never Used  . Alcohol use None  . Drug use: Unknown  . Sexual activity: Not Asked   Other Topics Concern  . None   Social History Narrative  . None   Past Surgical History:  Procedure Laterality Date  . CESAREAN SECTION     x2  . OVARIAN CYST REMOVAL     Past Medical History:  Diagnosis Date  . GERD (gastroesophageal reflux  disease)   . Gestational diabetes   . Hypertension   . Neuromuscular disorder (HCC) 2011   MS  . Presence of permanent cardiac pacemaker   . Seizures (HCC)    BP 123/86   Pulse 85   Resp 14   SpO2 97%   Opioid Risk Score:   Fall Risk Score:  `1  Depression screen PHQ 2/9  Depression screen [facility] 2/9 07/19/2015 02/15/2015 10/12/2014 08/21/2014 04/28/2014  Decreased Interest 0 1 1 1 1   Down, Depressed, Hopeless 0 1 1 1 1   PHQ - 2 Score 0 2 2 2 2   Altered sleeping - - - - 2  Tired, decreased energy - - - - 3  Change in appetite - - - - 3  Feeling bad or failure about yourself  - - - - 0  Trouble concentrating - - - - 3  Moving slowly or fidgety/restless - - - - 0  Suicidal thoughts - - - - 0  PHQ-9 Score - - - - 13     Review of Systems  All other systems reviewed and are negative.      Objective:   Physical Exam  Constitutional: She is oriented to person, place, and time. She appears well-developed and well-nourished.  HENT:  Head: Normocephalic and atraumatic.  Neck: Normal range of motion. Neck supple.  Cardiovascular: Normal rate and  regular rhythm.   Pulmonary/Chest: Effort normal and breath sounds normal.  Musculoskeletal:  Normal Muscle Bulk and Muscle Testing Reveals: Upper Extremities: Full ROM and Muscle Strength 5/5 Thoracic Paraspinal Tenderness: T-10- T-12 Lumbar Paraspinal Tenderness: L-3- L-5 Lower Extremities: Full ROM and Muscle Strength 5/5 Arises from chair slowly Narrow Based Gait  Neurological: She is alert and oriented to person, place, and time.  Skin: Skin is warm and dry.  Psychiatric: She has a normal mood and affect.  Nursing note and vitals reviewed.         Assessment & Plan:  1.Multiple Sclerosis: Neurology Following: Dr. Sherryll BurgerShah  2. Low back pain/ Neuropathic Pain: Continue Current exercise Regime. Continue using heat therapy. Continue Gabapentin and Pamelor Refilled: OxyCODONE 10/325 mg one tablet every 6 hours as need for pain.#  120. We will continue the opioid monitoring program, this consists of regular clinic visits, examinations, urine drug screen, pill counts as well use of [place] Controlled Substance reporting System. 3. Muscle Spasms: Continue Tizanidine  4. Depression/anxiety:Psychiatry Following Dr. Suzie PortelaMoffitt: Continue Monthly Counseling. Continue Current Medication Regime. Xanax and Cymbalta 5. Morbid obesity:Encouraged to continue with weight Loss and Healthy Living Regimen. 6. Insomnia: No Complaints Today: Continue Ambien.   20 minutes of face to face patient care time was spent during this visit. All questions were encouraged and answered.   F/U in 1 month

## 2016-01-13 ENCOUNTER — Encounter: Payer: Self-pay | Admitting: Registered Nurse

## 2016-01-13 ENCOUNTER — Encounter: Payer: Medicare HMO | Attending: Physical Medicine & Rehabilitation | Admitting: Registered Nurse

## 2016-01-13 VITALS — BP 144/92 | HR 101 | Resp 14

## 2016-01-13 DIAGNOSIS — M792 Neuralgia and neuritis, unspecified: Secondary | ICD-10-CM

## 2016-01-13 DIAGNOSIS — M4316 Spondylolisthesis, lumbar region: Secondary | ICD-10-CM | POA: Diagnosis present

## 2016-01-13 DIAGNOSIS — M545 Low back pain, unspecified: Secondary | ICD-10-CM

## 2016-01-13 DIAGNOSIS — G894 Chronic pain syndrome: Secondary | ICD-10-CM

## 2016-01-13 DIAGNOSIS — Z79899 Other long term (current) drug therapy: Secondary | ICD-10-CM

## 2016-01-13 DIAGNOSIS — G35 Multiple sclerosis: Secondary | ICD-10-CM | POA: Insufficient documentation

## 2016-01-13 DIAGNOSIS — R2689 Other abnormalities of gait and mobility: Secondary | ICD-10-CM

## 2016-01-13 DIAGNOSIS — Z5181 Encounter for therapeutic drug level monitoring: Secondary | ICD-10-CM

## 2016-01-13 MED ORDER — OXYCODONE-ACETAMINOPHEN 10-325 MG PO TABS: 1.0000 | ORAL_TABLET | Freq: Four times a day (QID) | ORAL | 0 refills | Status: DC | PRN

## 2016-01-13 MED ORDER — GABAPENTIN 600 MG PO TABS: 600.0000 mg | ORAL_TABLET | Freq: Three times a day (TID) | ORAL | 4 refills | Status: DC

## 2016-01-13 NOTE — Addendum Note (Signed)
Addended by: Angela Nevin D on: 01/13/2016 03:34 PM   Modules accepted: Orders

## 2016-01-13 NOTE — Progress Notes (Signed)
Subjective:    Patient ID: Mariah Morris, female    DOB: 1970/08/03, 45 y.o.   MRN: [id number]  HPI: :Ms. Kathryne GinShantelle Favia is a 45 year old female who returns for follow up appointment for chronic pain and medication refill. She states her pain is located in her upper-lower back radiating into her lower extremities posteriorly and bilateral knees. She rates her pain 8. . Her current exercise regime is walking at the park twice a day and performing stretching exercises.  Pain Inventory Average Pain 8 Pain Right Now 8 My pain is constant, burning, stabbing, tingling and aching  In the last 24 hours, has pain interfered with the following? General activity 6 Relation with others 6 Enjoyment of life 6 What TIME of day is your pain at its worst? evening, night Sleep (in general) Fair  Pain is worse with: walking, bending, sitting and standing Pain improves with: rest, heat/ice, therapy/exercise and medication Relief from Meds: 7  Mobility walk without assistance Do you have any goals in this area?  no  Function Do you have any goals in this area?  no  Neuro/Psych weakness trouble walking spasms confusion depression anxiety  Prior Studies Any changes since last visit?  no  Physicians involved in your care Any changes since last visit?  no   Family History  Problem Relation Age of Onset  . Hypertension Mother   . Diabetes Father   . Hypertension Father    Social History   Social History  . Marital status: Single    Spouse name: N/A  . Number of children: N/A  . Years of education: N/A   Social History Main Topics  . Smoking status: Never Smoker  . Smokeless tobacco: Never Used  . Alcohol use None  . Drug use: Unknown  . Sexual activity: Not Asked   Other Topics Concern  . None   Social History Narrative  . None   Past Surgical History:  Procedure Laterality Date  . CESAREAN SECTION     x2  . OVARIAN CYST REMOVAL     Past Medical History:    Diagnosis Date  . GERD (gastroesophageal reflux disease)   . Gestational diabetes   . Hypertension   . Neuromuscular disorder (HCC) 2011   MS  . Presence of permanent cardiac pacemaker   . Seizures (HCC)    BP (!) 144/92 (BP Location: Right Wrist, Patient Position: Sitting, Cuff Size: Normal)   Pulse (!) 101   Resp 14   SpO2 98%   Opioid Risk Score:   Fall Risk Score:  `1  Depression screen PHQ 2/9  [facility] 2/9 07/19/2015 02/15/2015 10/12/2014 08/21/2014 04/28/2014  Decreased Interest 0 1 1 1 1   Down, Depressed, Hopeless 0 1 1 1 1   PHQ - 2 Score 0 2 2 2 2   Altered sleeping - - - - 2  Tired, decreased energy - - - - 3  Change in appetite - - - - 3  Feeling bad or failure about yourself  - - - - 0  Trouble concentrating - - - - 3  Moving slowly or fidgety/restless - - - - 0  Suicidal thoughts - - - - 0  PHQ-9 Score - - - - 13    Review of Systems  HENT: Negative.   Eyes: Negative.   Respiratory: Negative.   Cardiovascular: Negative.   Gastrointestinal: Negative.   Endocrine: Negative.   Genitourinary: Negative.   Musculoskeletal: Positive for arthralgias, back pain,  gait problem, myalgias, neck pain and neck stiffness.       Spasms   Allergic/Immunologic: Negative.   Neurological: Positive for weakness.  Hematological: Negative.   Psychiatric/Behavioral: Positive for confusion and dysphoric mood. The patient is nervous/anxious.   All other systems reviewed and are negative.      Objective:   Physical Exam  Constitutional: She is oriented to person, place, and time. She appears well-developed and well-nourished.  HENT:  Head: Normocephalic and atraumatic.  Neck: Normal range of motion. Neck supple.  Cardiovascular: Normal rate and regular rhythm.   Pulmonary/Chest: Effort normal and breath sounds normal.  Musculoskeletal:  Normal Muscle Bulk and Muscle Testing Reveals: Upper Extremities: Full ROM and Muscle Strength 5/5 Thoracic Paraspinal  Tenderness: T-1-T-3 Lumbar Paraspinal Tenderness: L-3-L-5 Lower Extremities: Full ROM and Muscle Strength 5/5 Bilateral Lower Extremities Flexion Produces Pain into Lower Extremities Posteriorly Arises from Table Slowly Antalgic gait  Neurological: She is alert and oriented to person, place, and time.  Skin: Skin is warm and dry.  Psychiatric: She has a normal mood and affect.  Nursing note and vitals reviewed.         Assessment & Plan:  1.Multiple Sclerosis: Neurology Following: Dr. Sherryll Burger  2. Low back pain/ Neuropathic Pain: Continue Current exercise Regime. Continue using heat therapy. Continue Gabapentin and Pamelor Refilled: OxyCODONE 10/325 mg one tablet every 6 hours as need for pain.# 120. We will continue the opioid monitoring program, this consists of regular clinic visits, examinations, urine drug screen, pill counts as well use of [place] Controlled Substance reporting System. 3. Muscle Spasms: Continue Tizanidine  4. Depression/anxiety:Psychiatry Following Dr. Suzie Portela: Continue Monthly Counseling. Continue Current Medication Regime. Xanax  5. Morbid obesity:Encouraged to continue with weight Loss and Healthy Living Regimen. 6. Insomnia: No Complaints Today: Continue Ambien.   20 minutes of face to face patient care time was spent during this visit. All questions were encouraged and answered.   F/U in 1 month

## 2016-01-20 LAB — TOXASSURE SELECT,+ANTIDEPR,UR

## 2016-01-27 NOTE — Progress Notes (Signed)
Urine drug screen for this encounter is consistent for prescribed medication 

## 2016-02-24 ENCOUNTER — Ambulatory Visit: Payer: Self-pay | Admitting: Registered Nurse

## 2016-02-28 ENCOUNTER — Telehealth: Payer: Self-pay

## 2016-02-28 NOTE — Telephone Encounter (Signed)
On February 28, 2016  NCCSR was reviewed. No conflict was seen on the [facility] Controlled Substance Reporting System with multiple prescribers. Genella Asaro has signed a Hotel manager with our office. If there are any discrepancies this will be reported to her Physician.

## 2016-02-29 ENCOUNTER — Encounter: Payer: Self-pay | Admitting: Registered Nurse

## 2016-02-29 ENCOUNTER — Telehealth: Payer: Self-pay | Admitting: Registered Nurse

## 2016-02-29 ENCOUNTER — Encounter: Payer: Medicare HMO | Attending: Physical Medicine & Rehabilitation | Admitting: Registered Nurse

## 2016-02-29 VITALS — BP 124/85 | HR 83

## 2016-02-29 DIAGNOSIS — M4316 Spondylolisthesis, lumbar region: Secondary | ICD-10-CM | POA: Diagnosis not present

## 2016-02-29 DIAGNOSIS — Z5181 Encounter for therapeutic drug level monitoring: Secondary | ICD-10-CM

## 2016-02-29 DIAGNOSIS — M62838 Other muscle spasm: Secondary | ICD-10-CM | POA: Diagnosis not present

## 2016-02-29 DIAGNOSIS — M792 Neuralgia and neuritis, unspecified: Secondary | ICD-10-CM | POA: Diagnosis not present

## 2016-02-29 DIAGNOSIS — M545 Low back pain: Secondary | ICD-10-CM | POA: Diagnosis present

## 2016-02-29 DIAGNOSIS — G35 Multiple sclerosis: Secondary | ICD-10-CM | POA: Diagnosis not present

## 2016-02-29 DIAGNOSIS — G894 Chronic pain syndrome: Secondary | ICD-10-CM

## 2016-02-29 DIAGNOSIS — Z79899 Other long term (current) drug therapy: Secondary | ICD-10-CM

## 2016-02-29 DIAGNOSIS — R2689 Other abnormalities of gait and mobility: Secondary | ICD-10-CM

## 2016-02-29 MED ORDER — OXYCODONE-ACETAMINOPHEN 10-325 MG PO TABS: 1.0000 | ORAL_TABLET | Freq: Four times a day (QID) | ORAL | 0 refills | Status: DC | PRN

## 2016-02-29 NOTE — Progress Notes (Signed)
Subjective:    Patient ID: Mariah Morris, female    DOB: 26-Sep-1970, 46 y.o.   MRN: [id number]  HPI: [name] is a 46 year old female who returns for follow up appointment for chronic pain and medication refill. She states her pain is located in her lower back and lower extremities.She rates her pain 7. Her current exercise regime is walking and performing stretching exercises. Also states she fell a few weeks ago, she was removing groceries from her trunk and lost her balanced. She landed on her right knee, she was able to pick herself up. She didn't seek medical attention.   Pain Inventory Average Pain 7 Pain Right Now 7 My pain is constant, burning and stabbing  In the last 24 hours, has pain interfered with the following? General activity 5 Relation with others 5 Enjoyment of life 5 What TIME of day is your pain at its worst? evening Sleep (in general) .  Pain is worse with: walking, bending, sitting, inactivity, standing and some activites Pain improves with: rest, heat/ice, therapy/exercise and medication Relief from Meds: 8  Mobility walk without assistance  Function Do you have any goals in this area?  yes  Neuro/Psych trouble walking spasms confusion anxiety  Prior Studies Any changes since last visit?  no  Physicians involved in your care Any changes since last visit?  no   Family History  Problem Relation Age of Onset  . Hypertension Mother   . Diabetes Father   . Hypertension Father    Social History   Social History  . Marital status: Single    Spouse name: N/A  . Number of children: N/A  . Years of education: N/A   Social History Main Topics  . Smoking status: Never Smoker  . Smokeless tobacco: Never Used  . Alcohol use Not on file  . Drug use: Unknown  . Sexual activity: Not on file   Other Topics Concern  . Not on file   Social History Narrative  . No narrative on file   Past Surgical History:  Procedure  Laterality Date  . CESAREAN SECTION     x2  . OVARIAN CYST REMOVAL     Past Medical History:  Diagnosis Date  . GERD (gastroesophageal reflux disease)   . Gestational diabetes   . Hypertension   . Neuromuscular disorder (HCC) 2011   MS  . Presence of permanent cardiac pacemaker   . Seizures (HCC)    There were no vitals taken for this visit.  Opioid Risk Score:   Fall Risk Score:  `1  Depression screen PHQ 2/9  Depression screen [facility] 2/9 07/19/2015 02/15/2015 10/12/2014 08/21/2014 04/28/2014  Decreased Interest 0 1 1 1 1   Down, Depressed, Hopeless 0 1 1 1 1   PHQ - 2 Score 0 2 2 2 2   Altered sleeping - - - - 2  Tired, decreased energy - - - - 3  Change in appetite - - - - 3  Feeling bad or failure about yourself  - - - - 0  Trouble concentrating - - - - 3  Moving slowly or fidgety/restless - - - - 0  Suicidal thoughts - - - - 0  PHQ-9 Score - - - - 13   Review of Systems  Constitutional: Negative.   HENT: Negative.   Eyes: Negative.   Respiratory: Negative.   Cardiovascular: Negative.   Gastrointestinal: Negative.   Endocrine: Negative.   Genitourinary: Negative.   Musculoskeletal: Positive for  back pain and gait problem.  Skin: Negative.   Allergic/Immunologic: Negative.   Neurological: Positive for numbness.  Hematological: Negative.   Psychiatric/Behavioral: Positive for confusion. The patient is nervous/anxious.        Objective:   Physical Exam  Constitutional: She is oriented to person, place, and time. She appears well-developed and well-nourished.  HENT:  Head: Normocephalic and atraumatic.  Neck: Normal range of motion. Neck supple.  Cardiovascular: Normal rate and regular rhythm.   Pulmonary/Chest: Effort normal and breath sounds normal.  Musculoskeletal:  Normal Muscle Bulk and Muscle Testing Reveals: Upper Extremities: Full ROM and Muscle Strength 5/5 Lumbar Paraspinal Tenderness: L-2-L-4 Lower Extremities: Full ROM and Muscle Strength  5/5 Bilateral Lower Extremities Flexion Produces Pain into Extremities Arises from Table Slowly Antalgic Gait  Neurological: She is alert and oriented to person, place, and time.  Skin: Skin is warm and dry.  Psychiatric: She has a normal mood and affect.  Nursing note and vitals reviewed.         Assessment & Plan:  1.Multiple Sclerosis: Neurology Following: Dr. Sherryll Burger  2. Low back pain/ Neuropathic Pain: Continue Current exercise Regime. Continue using heat therapy. Continue Gabapentin and Pamelor Refilled: OxyCODONE 10/325 mg one tablet every 6 hours as need for pain.# 120. We will continue the opioid monitoring program, this consists of regular clinic visits, examinations, urine drug screen, pill counts as well use of [place] Controlled Substance reporting System. 3. Muscle Spasms: Continue Tizanidine  4. Depression/anxiety:Psychiatry Following Dr. Suzie Portela: Continue Monthly Counseling. Continue Current Medication Regime. Xanax  5. Morbid obesity:Encouraged to continue with weight Loss and Healthy Living Regimen. 6. Insomnia: No Complaints Today: Continue Ambien.   20 minutes of face to face patient care time was spent during this visit. All questions were encouraged and answered.   F/U in 1 month

## 2016-02-29 NOTE — Telephone Encounter (Signed)
On January 30,2018 the  NCCSR was reviewed no conflict was seen on the [place] Controlled Substance Reporting System with multiple prescribers. Mariah Morris has a signed narcotic contract with our office. If there were any discrepancies this would have been reported to her physician.

## 2016-03-27 ENCOUNTER — Encounter: Payer: Medicare HMO | Attending: Physical Medicine & Rehabilitation | Admitting: Registered Nurse

## 2016-03-27 ENCOUNTER — Encounter: Payer: Self-pay | Admitting: Registered Nurse

## 2016-03-27 VITALS — BP 115/83 | HR 78 | Resp 16

## 2016-03-27 DIAGNOSIS — R2689 Other abnormalities of gait and mobility: Secondary | ICD-10-CM | POA: Diagnosis not present

## 2016-03-27 DIAGNOSIS — G894 Chronic pain syndrome: Secondary | ICD-10-CM | POA: Diagnosis not present

## 2016-03-27 DIAGNOSIS — Z5181 Encounter for therapeutic drug level monitoring: Secondary | ICD-10-CM | POA: Diagnosis not present

## 2016-03-27 DIAGNOSIS — M545 Low back pain: Secondary | ICD-10-CM | POA: Insufficient documentation

## 2016-03-27 DIAGNOSIS — M62838 Other muscle spasm: Secondary | ICD-10-CM | POA: Diagnosis not present

## 2016-03-27 DIAGNOSIS — G35 Multiple sclerosis: Secondary | ICD-10-CM

## 2016-03-27 DIAGNOSIS — Z79899 Other long term (current) drug therapy: Secondary | ICD-10-CM | POA: Diagnosis not present

## 2016-03-27 DIAGNOSIS — M4316 Spondylolisthesis, lumbar region: Secondary | ICD-10-CM

## 2016-03-27 DIAGNOSIS — M792 Neuralgia and neuritis, unspecified: Secondary | ICD-10-CM | POA: Diagnosis not present

## 2016-03-27 MED ORDER — OXYCODONE-ACETAMINOPHEN 10-325 MG PO TABS
1.0000 | ORAL_TABLET | Freq: Four times a day (QID) | ORAL | 0 refills | Status: DC | PRN
Start: 2016-03-27 — End: 2016-04-26

## 2016-03-27 NOTE — Progress Notes (Signed)
Subjective:    Patient ID: Mariah Morris, female    DOB: 06-Apr-1970, 46 y.o.   MRN: [id number]  HPI:  [name] is a 46 year old female who returns for follow up appointmentfor chronic pain and medication refill. She states her pain is located in her upper- lower back and radiating into her bilateral lower extremities R>L.She rates her pain 7. Her current exercise regime is walking and performing stretching exercises.  Pain Inventory Average Pain 7 Pain Right Now 7 My pain is not answered  In the last 24 hours, has pain interfered with the following? General activity 5 Relation with others 6 Enjoyment of life 6 What TIME of day is your pain at its worst? evening and night Sleep (in general) NA  Pain is worse with: walking, bending, sitting and some activites Pain improves with: medication Relief from Meds: 8  Mobility Do you have any goals in this area?  no  Function Do you have any goals in this area?  no  Neuro/Psych tingling trouble walking spasms confusion anxiety  Prior Studies Any changes since last visit?  no  Physicians involved in your care Any changes since last visit?  no   Family History  Problem Relation Age of Onset  . Hypertension Mother   . Diabetes Father   . Hypertension Father    Social History   Social History  . Marital status: Single    Spouse name: N/A  . Number of children: N/A  . Years of education: N/A   Social History Main Topics  . Smoking status: Never Smoker  . Smokeless tobacco: Never Used  . Alcohol use None  . Drug use: Unknown  . Sexual activity: Not Asked   Other Topics Concern  . None   Social History Narrative  . None   Past Surgical History:  Procedure Laterality Date  . CESAREAN SECTION     x2  . OVARIAN CYST REMOVAL     Past Medical History:  Diagnosis Date  . GERD (gastroesophageal reflux disease)   . Gestational diabetes   . Hypertension   . Neuromuscular disorder (HCC) 2011   MS  . Presence of permanent cardiac pacemaker   . Seizures (HCC)    BP 115/83   Pulse 78   Resp 16   SpO2 97%   Opioid Risk Score:   Fall Risk Score:  `1  Depression screen PHQ 2/9  Depression screen [facility] 2/9 03/27/2016 07/19/2015 02/15/2015 10/12/2014 08/21/2014 04/28/2014  Decreased Interest 0 0 1 1 1 1   Down, Depressed, Hopeless 0 0 1 1 1 1   PHQ - 2 Score 0 0 2 2 2 2   Altered sleeping - - - - - 2  Tired, decreased energy - - - - - 3  Change in appetite - - - - - 3  Feeling bad or failure about yourself  - - - - - 0  Trouble concentrating - - - - - 3  Moving slowly or fidgety/restless - - - - - 0  Suicidal thoughts - - - - - 0  PHQ-9 Score - - - - - 13    Review of Systems  Constitutional: Negative.   HENT: Negative.   Eyes: Negative.   Respiratory: Negative.   Cardiovascular: Negative.   Gastrointestinal: Negative.   Endocrine: Negative.   Genitourinary: Negative.   Musculoskeletal: Positive for back pain.  Skin: Negative.   Neurological:       Tingling  Hematological: Negative.  Psychiatric/Behavioral: Positive for confusion. The patient is nervous/anxious.   All other systems reviewed and are negative.      Objective:   Physical Exam  Constitutional: She is oriented to person, place, and time. She appears well-developed and well-nourished.  HENT:  Head: Normocephalic and atraumatic.  Neck: Normal range of motion. Neck supple.  Cardiovascular: Normal rate and regular rhythm.   Pulmonary/Chest: Effort normal and breath sounds normal.  Musculoskeletal:  Normal Muscle Bulk and Muscle Testing Reveals: Upper Extremities: Full ROM and Muscle Strength 5/5 Bilateral AC Joint Tenderness Thoracic Paraspinal Tenderness: T-1-T-3 Lumbar Paraspinal Tenderness: L-3-L-5 Lower Extremities Full ROM and Muscle Strength 5/5 Right Lower Extremity Flexion Produces Pain into Lumbar and Right Knee Left Lower Extremity Flexion Produces Pain into Left Lower Extremity Arises from  Table Slowly Antalgic Gait  Neurological: She is alert and oriented to person, place, and time.  Skin: Skin is warm and dry.  Psychiatric: She has a normal mood and affect.  Nursing note and vitals reviewed.         Assessment & Plan:  1.Multiple Sclerosis: Neurology Following: Dr. Sherryll Burger. 03/27/2016  2. Low back pain/ Neuropathic Pain: Continue Current exercise Regime. 03/27/2016 Continue using heat therapy. Continue Gabapentin and Pamelor Refilled: OxyCODONE 10/325 mg one tablet every 6 hours as need for pain.# 120. We will continue the opioid monitoring program, this consists of regular clinic visits, examinations, urine drug screen, pill counts as well use of [place] Controlled Substance reporting System. 3. Muscle Spasms: Continue Tizanidine. 03/27/2016 4. Depression/anxiety:Psychiatry Following Dr. Suzie Portela: Continue Monthly Counseling. Continue Current Medication Regime. Xanax . 03/27/2016 5. Morbid obesity:Encouraged to continue with weight Loss and Healthy Living Regimen. 03/27/2016 6. Insomnia: No Complaints Today: Continue Ambien. 03/27/2016  20  minutes of face to face patient care time was spent during this visit. All questions were encouraged and answered.   F/U in 1 month

## 2016-04-26 ENCOUNTER — Encounter: Payer: Medicare HMO | Attending: Physical Medicine & Rehabilitation | Admitting: Physical Medicine & Rehabilitation

## 2016-04-26 ENCOUNTER — Encounter: Payer: Self-pay | Admitting: Physical Medicine & Rehabilitation

## 2016-04-26 VITALS — BP 163/92 | HR 81

## 2016-04-26 DIAGNOSIS — M792 Neuralgia and neuritis, unspecified: Secondary | ICD-10-CM | POA: Diagnosis present

## 2016-04-26 DIAGNOSIS — G894 Chronic pain syndrome: Secondary | ICD-10-CM | POA: Diagnosis not present

## 2016-04-26 DIAGNOSIS — M545 Low back pain: Secondary | ICD-10-CM | POA: Insufficient documentation

## 2016-04-26 DIAGNOSIS — R2689 Other abnormalities of gait and mobility: Secondary | ICD-10-CM | POA: Diagnosis not present

## 2016-04-26 DIAGNOSIS — G35 Multiple sclerosis: Secondary | ICD-10-CM | POA: Diagnosis not present

## 2016-04-26 DIAGNOSIS — Z79899 Other long term (current) drug therapy: Secondary | ICD-10-CM

## 2016-04-26 DIAGNOSIS — G35D Multiple sclerosis, unspecified: Secondary | ICD-10-CM

## 2016-04-26 DIAGNOSIS — M4316 Spondylolisthesis, lumbar region: Secondary | ICD-10-CM

## 2016-04-26 DIAGNOSIS — Z5181 Encounter for therapeutic drug level monitoring: Secondary | ICD-10-CM

## 2016-04-26 MED ORDER — OXYCODONE-ACETAMINOPHEN 10-325 MG PO TABS: 1.0000 | ORAL_TABLET | Freq: Four times a day (QID) | ORAL | 0 refills | Status: DC | PRN

## 2016-04-26 NOTE — Patient Instructions (Signed)
PLEASE FEEL FREE TO CALL OUR OFFICE WITH ANY PROBLEMS OR QUESTIONS ([phone])      

## 2016-04-26 NOTE — Progress Notes (Signed)
Subjective:    Patient ID: Mariah Morris, female    DOB: Aug 16, 1970, 46 y.o.   MRN: [id number]  HPI   Mariah Morris is here in folllow up of her MS and chronic pain. She states she has been more active and walking. She rides her bike regularly at home. She states that she feels better physically and emotinally when she exercises.   She is trying to watch her diet and cut back portions as she can. She is cutting back on fat, sugar, sodium too.   For pain she uses percocet for pain control wit hreasonable relief. Her sleep is good. zanaflex is useful for spasms  Pain Inventory Average Pain 7 Pain Right Now 7 My pain is constant, burning, stabbing, tingling and aching  In the last 24 hours, has pain interfered with the following? General activity 5 Relation with others 6 Enjoyment of life 6 What TIME of day is your pain at its worst? evening Sleep (in general) .  Pain is worse with: walking, bending, sitting, standing and some activites Pain improves with: rest, heat/ice, therapy/exercise and medication Relief from Meds: 7  Mobility walk without assistance  Function Do you have any goals in this area?  no  Neuro/Psych numbness tingling trouble walking  Prior Studies Any changes since last visit?  no  Physicians involved in your care Any changes since last visit?  no   Family History  Problem Relation Age of Onset  . Hypertension Mother   . Diabetes Father   . Hypertension Father    Social History   Social History  . Marital status: Single    Spouse name: N/A  . Number of children: N/A  . Years of education: N/A   Social History Main Topics  . Smoking status: Never Smoker  . Smokeless tobacco: Never Used  . Alcohol use Not on file  . Drug use: Unknown  . Sexual activity: Not on file   Other Topics Concern  . Not on file   Social History Narrative  . No narrative on file   Past Surgical History:  Procedure Laterality Date  . CESAREAN SECTION       x2  . OVARIAN CYST REMOVAL     Past Medical History:  Diagnosis Date  . GERD (gastroesophageal reflux disease)   . Gestational diabetes   . Hypertension   . Neuromuscular disorder (HCC) 2011   MS  . Presence of permanent cardiac pacemaker   . Seizures (HCC)    There were no vitals taken for this visit.  Opioid Risk Score:   Fall Risk Score:  `1  Depression screen PHQ 2/9  Depression screen [facility] 2/9 03/27/2016 07/19/2015 02/15/2015 10/12/2014 08/21/2014 04/28/2014  Decreased Interest 1 0 1 1 1 1   Down, Depressed, Hopeless 1 0 1 1 1 1   PHQ - 2 Score 2 0 2 2 2 2   Altered sleeping - - - - - 2  Tired, decreased energy - - - - - 3  Change in appetite - - - - - 3  Feeling bad or failure about yourself  - - - - - 0  Trouble concentrating - - - - - 3  Moving slowly or fidgety/restless - - - - - 0  Suicidal thoughts - - - - - 0  PHQ-9 Score - - - - - 13    Review of Systems  Constitutional: Negative.   HENT: Negative.   Eyes: Negative.   Respiratory: Negative.   Cardiovascular: Negative.  Gastrointestinal: Negative.   Endocrine: Negative.   Genitourinary: Negative.   Musculoskeletal: Negative.   Skin: Negative.   Allergic/Immunologic: Negative.   Neurological: Negative.   Hematological: Negative.   Psychiatric/Behavioral: Negative.   All other systems reviewed and are negative.      Objective:   Physical Exam  Constitutional: She is oriented to person, place, and time. She appears well-developed and well-nourished. IN distress Morbidly obese HENT:  Head: Normocephalic.  Neck: Neck supple.  Musculoskeletal: She exhibits tenderness. In all limbs    Neurological: She is alert and oriented to person, place, and time.  Skin: Skin is warm and dry.  Psychiatric: She has a normal mood and affect.  Symmetric normal motor tone is noted throughout. Normal muscle bulk. Muscle testing reveals 4-5/5 muscle strength of the upper extremity, and 4-5/5 of the lower extremity.   Sensory is decreased to light touch, on the right arm/leg.  DTR in the upper and lower extremity are present and symmetric 1+ except right patella 3+.   She is flexed 10 degrees in resting stance.  Low back remains tender to palpation in the L4-S1 areas. Extension causes the most pain.  Gait wide based.    Assessment: 1. MS-relaxing remitting  2. Low back pain, with documented Grade 1 spondylolisthesis of L4 on L5, With maladaptive posture  3. Right sided pain, neck, shoulder back and leg in a diffuse, non radicular pattern, most likely MS related  4. Depression/anxiety  5. Morbid obesity    Plan:  1. Made a referral for dietary consult in [place] at [facility]. She is working on better dietary habits already      2. For neuropathic leg pain, continue gabapentin at 600mg  TID (2 doses in the evening --dinner and bedtime--- to help with sleep)  3. Ambien  4. Zanaflex 4mg  TID.for spasms.   5. Percocet continues at 7.5 q8 prn. #100--second RF for next month 6. COntinue with HEP  7. Follow up with me or NP in About 2 months.  30 minutes of face to face patient care time were spent during this visit. All questions were encouraged and answered.

## 2016-04-30 LAB — TOXASSURE SELECT,+ANTIDEPR,UR

## 2016-05-11 ENCOUNTER — Telehealth: Payer: Self-pay | Admitting: Registered Nurse

## 2016-05-11 NOTE — Telephone Encounter (Signed)
Ms. Annand UDS was performed on 04/26/2016 it was consistent.

## 2016-05-16 ENCOUNTER — Ambulatory Visit: Payer: Self-pay | Admitting: Dietician

## 2016-05-22 ENCOUNTER — Encounter: Payer: Medicare HMO | Attending: Physical Medicine & Rehabilitation | Admitting: Dietician

## 2016-05-22 ENCOUNTER — Encounter: Payer: Self-pay | Admitting: Dietician

## 2016-05-22 VITALS — BP 108/92 | Ht 62.0 in | Wt 294.4 lb

## 2016-05-22 DIAGNOSIS — Z713 Dietary counseling and surveillance: Secondary | ICD-10-CM | POA: Diagnosis not present

## 2016-05-22 DIAGNOSIS — Z6841 Body Mass Index (BMI) 40.0 and over, adult: Secondary | ICD-10-CM | POA: Insufficient documentation

## 2016-05-22 DIAGNOSIS — E119 Type 2 diabetes mellitus without complications: Secondary | ICD-10-CM

## 2016-05-22 NOTE — Patient Instructions (Signed)
  Check blood sugars 2 x day before breakfast and 2 hrs after supper every day Exercise:  walk for    20  minutes   4  days a week Avoid sugar sweetened drinks (soda, tea, coffee, sports drinks, juices) Limit intake of fried foods and sweets/desserts Eat 3 meals day,   2  snacks a day in afternoon and at bedtime Space meals 4-6 hours apart Eat 2-3 carbohydrate servings/meal + protein Eat 1 carbohydrate serving/snack + protein Make dentist / eye doctor appointments Bring blood sugar records to the next appointment/class Get a Sharps container Return for appointment/classes on:  06-22-16

## 2016-05-22 NOTE — Progress Notes (Signed)
Diabetes Self-Management Education  Visit Type: First/Initial  Appt. Start Time: 1030 Appt. End Time: 1130  05/22/2016  [name], identified by name and date of birth, is a 46 y.o. female with a diagnosis of Diabetes: Type 2.   ASSESSMENT  Blood pressure (!) 108/92, height 5\' 2"  (1.575 m), weight 294 lb 6.4 oz (133.5 kg). Body mass index is 53.85 kg/m.      Diabetes Self-Management Education - 05/22/16 1207      Visit Information   Visit Type First/Initial     Initial Visit   Diabetes Type Type 2     Health Coping   How would you rate your overall health? Fair     Psychosocial Assessment   Patient Belief/Attitude about Diabetes Motivated to manage diabetes   Self-care barriers None   Self-management support Doctor's office;Family   Other persons present Patient   Patient Concerns Nutrition/Meal planning;Medication;Healthy Lifestyle;Glycemic Control;Weight Control;Problem Solving   Special Needs None   Preferred Learning Style Auditory   Learning Readiness Ready   What is the last grade level you completed in school? 9     Pre-Education Assessment   Patient understands the diabetes disease and treatment process. Needs Instruction   Patient understands incorporating nutritional management into lifestyle. Needs Instruction   Patient undertands incorporating physical activity into lifestyle. Needs Review   Patient understands using medications safely. Needs Review   Patient understands monitoring blood glucose, interpreting and using results Needs Review   Patient understands prevention, detection, and treatment of acute complications. Needs Instruction   Patient understands prevention, detection, and treatment of chronic complications. Needs Instruction   Patient understands how to develop strategies to address psychosocial issues. Needs Instruction   Patient understands how to develop strategies to promote health/change behavior. Needs Instruction     Complications   How often do you check your blood sugar? 3-4 times/day  3x/day-FBG, ac lunch and supper   Fasting Blood glucose range (mg/dL) 191-478  ac lunch and supper -mostly 150's   Have you had a dilated eye exam in the past 12 months? No  2-3 years ago   Have you had a dental exam in the past 12 months? No  8 years ago; has full upper denture   Are you checking your feet? Yes   How many days per week are you checking your feet? 7     Dietary Intake   Breakfast --  eats breakfast at 9a; eats cold cereal and almond milk or sausage or ham biscuit; eats out for breakfast 3x/wk   Snack (morning) --  eats nabs, yogurt or chips at 11a   Lunch --  eats lunch at 12:30p; usually eats meat/cheese sandwich with fruit, yogurt and/or chips   Snack (afternoon) --  eats chips, greek yogurt  or fruit at 3p   Dinner --  eats supper at 6p; eats sweets 4-5x/wk. and fried foods 2-3x/wk.   Snack (evening) --  none   Beverage(s) --  drinks 6-7 glasses of water/day, 1 cup coffee with sweetened creamer and almond milk 1-2x/day     Exercise   Exercise Type ADL's;Light (walking / raking leaves)  walks and does stretches arms and legs   How many days per week to you exercise? 3  3   How many minutes per day do you exercise? 15   Total minutes per week of exercise 45     Patient Education   Previous Diabetes Education No   Disease state  Definition of  diabetes, type 1 and 2, and the diagnosis of diabetes;Explored patient's options for treatment of their diabetes;Factors that contribute to the development of diabetes   Nutrition management  Role of diet in the treatment of diabetes and the relationship between the three main macronutrients and blood glucose level;Food label reading, portion sizes and measuring food.;Carbohydrate counting   Physical activity and exercise  Role of exercise on diabetes management, blood pressure control and cardiac health.;Helped patient identify appropriate exercises  in relation to his/her diabetes, diabetes complications and other health issue.   Medications Reviewed patients medication for diabetes, action, purpose, timing of dose and side effects.   Monitoring Purpose and frequency of SMBG.;Identified appropriate SMBG and/or A1C goals.;Yearly dilated eye exam;Daily foot exams;Taught/discussed recording of test results and interpretation of SMBG.   Chronic complications Relationship between chronic complications and blood glucose control;Assessed and discussed foot care and prevention of foot problems;Dental care;Retinopathy and reason for yearly dilated eye exams;Nephropathy, what it is, prevention of, the use of ACE, ARB's and early detection of through urine microalbumia.;Reviewed with patient heart disease, higher risk of, and prevention   Preconception care --  c/o vaginal dryness and pain with sexual intercourse-plans to talk to dr about estrogen use   Personal strategies to promote health Lifestyle issues that need to be addressed for better diabetes care;Helped patient develop diabetes management plan for (enter comment)      Individualized Plan for Diabetes Self-Management Training:   Learning Objective:  Patient will have a greater understanding of diabetes self-management. Patient education plan is to attend individual and/or group sessions per assessed needs and concerns.   Plan:   Patient Instructions   Check blood sugars 2 x day before breakfast and 2 hrs after supper every day Exercise:  walk for    20  minutes   4  days a week Avoid sugar sweetened drinks (soda, tea, coffee, sports drinks, juices) Limit intake of fried foods and sweets/desserts Eat 3 meals day,   2  snacks a day in afternoon and at bedtime Space meals 4-6 hours apart Eat 2-3 carbohydrate servings/meal + protein Eat 1 carbohydrate serving/snack + protein Make dentist / eye doctor appointments Bring blood sugar records to the next appointment/class Get a Sharps  container Return for appointment/classes on:  06-22-16   Expected Outcomes:   positive  Education material provided: General meal planning guidelines  If problems or questions, patient to contact team via:  [phone]  Future DSME appointment:  06-22-16

## 2016-06-22 ENCOUNTER — Encounter: Payer: Self-pay | Admitting: Dietician

## 2016-06-22 ENCOUNTER — Encounter: Payer: Medicare HMO | Attending: Physical Medicine & Rehabilitation | Admitting: Dietician

## 2016-06-22 VITALS — Wt 292.6 lb

## 2016-06-22 DIAGNOSIS — Z713 Dietary counseling and surveillance: Secondary | ICD-10-CM | POA: Diagnosis present

## 2016-06-22 DIAGNOSIS — Z6841 Body Mass Index (BMI) 40.0 and over, adult: Secondary | ICD-10-CM | POA: Insufficient documentation

## 2016-06-22 DIAGNOSIS — E119 Type 2 diabetes mellitus without complications: Secondary | ICD-10-CM

## 2016-06-22 NOTE — Progress Notes (Signed)
Appt. Start Time: 9:00 Appt. End Time: 12:00  Class 1 Diabetes Overview - define DM; state own type of DM; identify functions of pancreas and insulin; define insulin deficiency vs insulin resistance  Psychosocial - identify DM as a source of stress; state the effects of stress on BG control; verbalize appropriate stress management techniques; identify personal stress issues   Nutritional Management - describe effects of food on blood glucose; identify sources of carbohydrate, protein and fat; verbalize the importance of balance meals in controlling blood glucose; identify meals as well balanced or not; estimate servings of carbohydrate from menus; use food labels to identify servings size, content of carbohydrate, fiber, protein, fat, saturated fat and sodium; recognize food sources of fat, saturated fat, trans fat, sodium and verbalize goals for intake; describe healthful appropriate food choices when dining out   Exercise - describe the effects of exercise on blood glucose and importance of regular exercise in controlling diabetes; state a plan for personal exercise; verbalize contraindications for exercise  Self-Monitoring - state importance of HBGM and demo procedure accurately; use HBGM results to effectively manage diabetes; identify importance of regular HbA1C testing and goals for results  Acute Complications/Sick Day Guidelines - recognize hyperglycemia and hypoglycemia with causes and effects; identify blood glucose results as high, low or in control; list steps in treating and preventing high and low blood glucose; state appropriate measure to manage blood glucose when ill (need for meds, HBGM plan, when to call physician, need for fluids)  Chronic Complications/Foot, Skin, Eye Dental Care - identify possible long-term complications of diabetes (retinopathy, neuropathy, nephropathy, cardiovascular disease, infections); explain steps in prevention and treatment of chronic complications;  state importance of daily self-foot exams; describe how to examine feet and what to look for; explain appropriate eye and dental care  Lifestyle Changes/Goals & Health/Community Resources - state benefits of making appropriate lifestyle changes; identify habits that need to change (meals, tobacco, alcohol); identify strategies to reduce risk factors for personal health; set goals for proper diabetes care; state need for and frequency of healthcare follow-up; describe appropriate community resources for good health (ADA, web sites, apps)   Pregnancy/Sexual Health - define gestational diabetes; state importance of good blood glucose control and birth control prior to pregnancy; state importance of good blood glucose control in preventing sexual problems (impotence, vaginal dryness, infections, loss of desire); state relationship of blood glucose control and pregnancy outcome; describe risk of maternal and fetal complications  Teaching Materials Used: Class 1 Slides/Notebook Diabetes Booklet ID Card  Medic Alert/Medic ID Forms Sleep Evaluation Exercise Handout Daily Food Record Planning a Balanced Meal Goals for Class 1 

## 2016-06-23 ENCOUNTER — Telehealth: Payer: Self-pay | Admitting: Registered Nurse

## 2016-06-23 NOTE — Telephone Encounter (Signed)
On 06/23/2016 the  NCCSR was reviewed no conflict was seen on the [facility] Reporting System with multiple prescribers. [name] has a signed narcotic contract with our office. If there were any discrepancies this would have been reported to her physician.

## 2016-06-27 ENCOUNTER — Encounter: Payer: Medicare HMO | Attending: Physical Medicine & Rehabilitation | Admitting: Registered Nurse

## 2016-06-27 ENCOUNTER — Encounter: Payer: Self-pay | Admitting: Registered Nurse

## 2016-06-27 VITALS — BP 139/90 | HR 82 | Resp 14

## 2016-06-27 DIAGNOSIS — M62838 Other muscle spasm: Secondary | ICD-10-CM | POA: Diagnosis not present

## 2016-06-27 DIAGNOSIS — G35 Multiple sclerosis: Secondary | ICD-10-CM | POA: Diagnosis not present

## 2016-06-27 DIAGNOSIS — M792 Neuralgia and neuritis, unspecified: Secondary | ICD-10-CM | POA: Diagnosis present

## 2016-06-27 DIAGNOSIS — M545 Low back pain: Secondary | ICD-10-CM

## 2016-06-27 DIAGNOSIS — R2689 Other abnormalities of gait and mobility: Secondary | ICD-10-CM

## 2016-06-27 DIAGNOSIS — Z79899 Other long term (current) drug therapy: Secondary | ICD-10-CM

## 2016-06-27 DIAGNOSIS — G894 Chronic pain syndrome: Secondary | ICD-10-CM | POA: Diagnosis not present

## 2016-06-27 DIAGNOSIS — Z5181 Encounter for therapeutic drug level monitoring: Secondary | ICD-10-CM | POA: Diagnosis not present

## 2016-06-27 DIAGNOSIS — M4316 Spondylolisthesis, lumbar region: Secondary | ICD-10-CM | POA: Diagnosis not present

## 2016-06-27 DIAGNOSIS — M79604 Pain in right leg: Secondary | ICD-10-CM

## 2016-06-27 MED ORDER — OXYCODONE-ACETAMINOPHEN 10-325 MG PO TABS: 1.0000 | ORAL_TABLET | Freq: Four times a day (QID) | ORAL | 0 refills | Status: DC | PRN

## 2016-06-27 NOTE — Progress Notes (Signed)
Subjective:    Patient ID: Mariah Morris, female    DOB: 1970-03-20, 46 y.o.   MRN: [id number]  HPI: Mariah Morris is a 46 year old female who returns for follow up appointmentfor chronic pain and medication refill. She states her pain is located in her mid- lower back and radiating into her bilaterallower extremities R>L.She rates her pain 7. Her current exercise regime is walking in the park three times a week, chair exercises  and performing stretching exercises.  Mariah Morris states she is attending Diabetic Teaching classes weekly  Last UDS was performed on 04/26/2016 it was consistent.   Pain Inventory Average Pain 7 Pain Right Now 7 My pain is constant, burning, stabbing and aching  In the last 24 hours, has pain interfered with the following? General activity 6 Relation with others 6 Enjoyment of life 6 What TIME of day is your pain at its worst? evening, night  Sleep (in general) Fair  Pain is worse with: walking, bending, sitting, standing and some activites Pain improves with: rest, heat/ice and medication Relief from Meds: 7  Mobility Do you have any goals in this area?  no  Function Do you have any goals in this area?  no  Neuro/Psych trouble walking spasms confusion anxiety  Prior Studies Any changes since last visit?  no  Physicians involved in your care Any changes since last visit?  no   Family History  Problem Relation Age of Onset  . Hypertension Mother   . Diabetes Father   . Hypertension Father    Social History   Social History  . Marital status: Single    Spouse name: N/A  . Number of children: N/A  . Years of education: N/A   Social History Main Topics  . Smoking status: Never Smoker  . Smokeless tobacco: Never Used  . Alcohol use No  . Drug use: Unknown  . Sexual activity: Not Asked   Other Topics Concern  . None   Social History Narrative  . None   Past Surgical History:  Procedure Laterality Date  .  CESAREAN SECTION     x2  . OVARIAN CYST REMOVAL     Past Medical History:  Diagnosis Date  . GERD (gastroesophageal reflux disease)   . Gestational diabetes   . Hypertension   . Neuromuscular disorder (HCC) 2011   MS  . Presence of permanent cardiac pacemaker   . Seizures (HCC)    BP 139/90 (BP Location: Right Wrist, Patient Position: Sitting, Cuff Size: Normal)   Pulse 82   Resp 14   SpO2 95%   Opioid Risk Score:   Fall Risk Score:  `1  Depression screen PHQ 2/9  Depression screen [facility] 2/9 05/22/2016 03/27/2016 07/19/2015 02/15/2015 10/12/2014 08/21/2014 04/28/2014  Decreased Interest 0 1 0 1 1 1 1   Down, Depressed, Hopeless 1 1 0 1 1 1 1   PHQ - 2 Score 1 2 0 2 2 2 2   Altered sleeping - - - - - - 2  Tired, decreased energy - - - - - - 3  Change in appetite - - - - - - 3  Feeling bad or failure about yourself  - - - - - - 0  Trouble concentrating - - - - - - 3  Moving slowly or fidgety/restless - - - - - - 0  Suicidal thoughts - - - - - - 0  PHQ-9 Score - - - - - - 13  Review of Systems  Constitutional: Negative.   HENT: Negative.   Eyes: Negative.   Respiratory: Negative.   Cardiovascular: Negative.   Gastrointestinal: Negative.   Endocrine:       Diabetes  Genitourinary: Negative.   Musculoskeletal: Positive for arthralgias, back pain, gait problem, myalgias, neck pain and neck stiffness.  Allergic/Immunologic: Negative.   Hematological: Negative.   Psychiatric/Behavioral: Positive for confusion. The patient is nervous/anxious.        Objective:   Physical Exam  Constitutional: She is oriented to person, place, and time. She appears well-developed and well-nourished.  HENT:  Head: Normocephalic and atraumatic.  Neck: Normal range of motion. Neck supple.  Cardiovascular: Normal rate and regular rhythm.   Pulmonary/Chest: Effort normal and breath sounds normal.  Musculoskeletal:  Normal Muscle Bulk and Muscle Testing Reveals: Upper Extremities: Full ROM and  Muscle Strength 5/5 Thoracic Paraspinal Tenderness: T-7-T-9 Lumbar Paraspinal Tenderness: L-3-L-5 Lower Extremities: Full ROM and Muscle Strength 5/5 Arises from Table Slowly Antalgic gait  Neurological: She is alert and oriented to person, place, and time.  Skin: Skin is warm and dry.  Psychiatric: She has a normal mood and affect.  Nursing note and vitals reviewed.         Assessment & Plan:  1.Multiple Sclerosis: Neurology Following: Dr. Sherryll Burger. 06/27/2016  2. Low back pain/ Neuropathic Pain: Continue Current exercise Regime. 06/27/2016 Continue using heat therapy. Continue Gabapentin and Pamelor Refilled: OxyCODONE 10/325 mg one tablet every 6 hours as need for pain.# 120. We will continue the opioid monitoring program, this consists of regular clinic visits, examinations, urine drug screen, pill counts as well use of [place] Controlled Substance reporting System. 3. Muscle Spasms: Continue Tizanidine. 06/27/2016 4. Depression/anxiety:Psychiatry Following Dr. Suzie Portela: Continue Monthly Counseling. Continue Current Medication Regime. Xanax . 06/27/2016 5. Morbid obesity:Encouraged to continue with weight Loss and Healthy Living Regimen. 06/27/2016 6. Insomnia: No Complaints Today: Continue Ambien. 06/27/2016  15 minutes of face to face patient care time was spent during this visit. All questions were encouraged and answered.    F/U in 1 month

## 2016-06-29 ENCOUNTER — Encounter: Payer: Self-pay | Admitting: *Deleted

## 2016-06-29 ENCOUNTER — Encounter: Payer: Medicare HMO | Admitting: *Deleted

## 2016-06-29 VITALS — Wt 292.2 lb

## 2016-06-29 DIAGNOSIS — E119 Type 2 diabetes mellitus without complications: Secondary | ICD-10-CM

## 2016-06-29 DIAGNOSIS — Z713 Dietary counseling and surveillance: Secondary | ICD-10-CM | POA: Diagnosis not present

## 2016-06-29 NOTE — Progress Notes (Signed)
Appt. Start Time: 0900 Appt. End Time: 1200  Class 2 Nutritional Management - identify sources of carbohydrate, protein and fat; plan balanced meals; estimate servings of carbohydrates in meals  Psychosocial - identify DM as a source of stress; state the effects of stress on BG control  Exercise - describe the effects of exercise on blood glucose and importance of regular exercise in controlling diabetes; state a plan for personal exercise; verbalize contraindications for exercise  Self-Monitoring - state importance of SMBG; use SMBG results to effectively manage diabetes; identify importance of regular HbA1C testing and goals for results  Acute Complications - recognize hyperglycemia and hypoglycemia with causes and effects; identify blood glucose results as high, low or in control; list steps in treating and preventing high and low blood glucose  Sick Day Guidelines: state appropriate measure to manage blood glucose when ill (need for meds, HBGM plan, when to call physician, need for fluids)  Chronic Complications/Foot, Skin, Eye Dental Care - identify possible long-term complications of diabetes (retinopathy, neuropathy, nephropathy, cardiovascular disease, infections); explain steps in prevention and treatment of chronic complications; state importance of daily self-foot exams; describe how to examine feet and what to look for; explain appropriate eye and dental care  Lifestyle Changes/Goals - state benefits of making appropriate lifestyle changes; identify habits that need to change (meals, tobacco, alcohol); identify strategies to reduce risk factors for personal health  Pregnancy/Sexual Health - state importance of good blood glucose control in preventing sexual problems (impotence, vaginal dryness, infections, loss of desire)  Teaching Materials Used: Class 2 Slide Packet A1C Pamphlet Foot Care Literature Kidney Test Handout Quick and "Balanced" Meal Ideas Carb Counting and Meal  Planning Book Goals for Class 2 

## 2016-07-06 ENCOUNTER — Encounter: Payer: Medicare HMO | Attending: Physical Medicine & Rehabilitation | Admitting: Dietician

## 2016-07-06 ENCOUNTER — Encounter: Payer: Self-pay | Admitting: Dietician

## 2016-07-06 VITALS — BP 128/90 | Wt 286.8 lb

## 2016-07-06 DIAGNOSIS — Z713 Dietary counseling and surveillance: Secondary | ICD-10-CM | POA: Diagnosis present

## 2016-07-06 DIAGNOSIS — E119 Type 2 diabetes mellitus without complications: Secondary | ICD-10-CM

## 2016-07-06 DIAGNOSIS — Z6841 Body Mass Index (BMI) 40.0 and over, adult: Secondary | ICD-10-CM | POA: Diagnosis not present

## 2016-07-06 NOTE — Progress Notes (Signed)

## 2016-07-11 ENCOUNTER — Encounter: Payer: Self-pay | Admitting: Dietician

## 2016-07-25 ENCOUNTER — Encounter: Payer: Self-pay | Admitting: Registered Nurse

## 2016-07-25 ENCOUNTER — Encounter: Payer: Medicare HMO | Attending: Physical Medicine & Rehabilitation | Admitting: Registered Nurse

## 2016-07-25 VITALS — BP 118/80 | HR 77

## 2016-07-25 DIAGNOSIS — Z5181 Encounter for therapeutic drug level monitoring: Secondary | ICD-10-CM | POA: Diagnosis not present

## 2016-07-25 DIAGNOSIS — R2689 Other abnormalities of gait and mobility: Secondary | ICD-10-CM

## 2016-07-25 DIAGNOSIS — M545 Low back pain, unspecified: Secondary | ICD-10-CM

## 2016-07-25 DIAGNOSIS — G894 Chronic pain syndrome: Secondary | ICD-10-CM | POA: Diagnosis not present

## 2016-07-25 DIAGNOSIS — Z79899 Other long term (current) drug therapy: Secondary | ICD-10-CM | POA: Diagnosis not present

## 2016-07-25 DIAGNOSIS — M792 Neuralgia and neuritis, unspecified: Secondary | ICD-10-CM

## 2016-07-25 DIAGNOSIS — M4316 Spondylolisthesis, lumbar region: Secondary | ICD-10-CM

## 2016-07-25 DIAGNOSIS — G35 Multiple sclerosis: Secondary | ICD-10-CM

## 2016-07-25 DIAGNOSIS — G35D Multiple sclerosis, unspecified: Secondary | ICD-10-CM

## 2016-07-25 MED ORDER — OXYCODONE-ACETAMINOPHEN 10-325 MG PO TABS: 1.0000 | ORAL_TABLET | Freq: Four times a day (QID) | ORAL | 0 refills | Status: DC | PRN

## 2016-07-25 MED ORDER — GABAPENTIN 600 MG PO TABS: 600.0000 mg | ORAL_TABLET | Freq: Three times a day (TID) | ORAL | 4 refills | Status: DC

## 2016-07-25 MED ORDER — TIZANIDINE HCL 4 MG PO TABS: 4.0000 mg | ORAL_TABLET | Freq: Three times a day (TID) | ORAL | 4 refills | Status: DC

## 2016-07-25 NOTE — Progress Notes (Signed)
Subjective:    Patient ID: Mariah Morris, female    DOB: October 24, 1970, 46 y.o.   MRN: [id number]  HPI: [name] is a 46 year old female who returns for follow up appointmentfor chronic pain and medication refill. She states her pain is located in herlower back radiating into her bilaterallower extremities R>L.She rates her pain 8. Her current exercise regime is walking in the park twice a day, chair exercises  and performing stretching exercises.  Also states she has lost 15 lbs, encouraged to continue with Healthy Diet regime.  Last UDS was performed on 04/26/2016 it was consistent.    Pain Inventory Average Pain 8 Pain Right Now 8 My pain is constant, sharp and aching  In the last 24 hours, has pain interfered with the following? General activity 8 Relation with others 8 Enjoyment of life 8 What TIME of day is your pain at its worst? . Sleep (in general) Fair  Pain is worse with: . Pain improves with: . Relief from Meds: 5  Mobility use a cane ability to climb steps?  no do you drive?  yes  Function Do you have any goals in this area?  no  Neuro/Psych trouble walking spasms confusion depression anxiety  Prior Studies Any changes since last visit?  no  Physicians involved in your care Any changes since last visit?  no   Family History  Problem Relation Age of Onset  . Hypertension Mother   . Diabetes Father   . Hypertension Father    Social History   Social History  . Marital status: Single    Spouse name: N/A  . Number of children: N/A  . Years of education: N/A   Social History Main Topics  . Smoking status: Never Smoker  . Smokeless tobacco: Never Used  . Alcohol use No  . Drug use: Unknown  . Sexual activity: Not on file   Other Topics Concern  . Not on file   Social History Narrative  . No narrative on file   Past Surgical History:  Procedure Laterality Date  . CESAREAN SECTION     x2  . OVARIAN CYST REMOVAL      Past Medical History:  Diagnosis Date  . GERD (gastroesophageal reflux disease)   . Gestational diabetes   . Hypertension   . Neuromuscular disorder (HCC) 2011   MS  . Presence of permanent cardiac pacemaker   . Seizures (HCC)    There were no vitals taken for this visit.  Opioid Risk Score:   Fall Risk Score:  `1  Depression screen PHQ 2/9  Depression screen [facility] 2/9 05/22/2016 03/27/2016 07/19/2015 02/15/2015 10/12/2014 08/21/2014 04/28/2014  Decreased Interest 0 1 0 1 1 1 1   Down, Depressed, Hopeless 1 1 0 1 1 1 1   PHQ - 2 Score 1 2 0 2 2 2 2   Altered sleeping - - - - - - 2  Tired, decreased energy - - - - - - 3  Change in appetite - - - - - - 3  Feeling bad or failure about yourself  - - - - - - 0  Trouble concentrating - - - - - - 3  Moving slowly or fidgety/restless - - - - - - 0  Suicidal thoughts - - - - - - 0  PHQ-9 Score - - - - - - 13     Review of Systems  Constitutional: Negative.   HENT: Negative.   Eyes: Negative.  Respiratory: Negative.   Cardiovascular: Negative.   Gastrointestinal: Negative.   Endocrine: Negative.   Genitourinary: Negative.   Musculoskeletal: Negative.   Skin: Negative.   Allergic/Immunologic: Negative.   Neurological: Negative.   Hematological: Negative.   Psychiatric/Behavioral: Negative.   All other systems reviewed and are negative.      Objective:   Physical Exam  Constitutional: She is oriented to person, place, and time. She appears well-developed and well-nourished.  HENT:  Head: Normocephalic and atraumatic.  Neck: Normal range of motion. Neck supple.  Cardiovascular: Normal rate and regular rhythm.   Pulmonary/Chest: Effort normal and breath sounds normal.  Musculoskeletal:  Normal Muscle Bulk and Muscle Testing Reveals: Upper Extremities: Full ROM and Muscle Strength 5/5Bilateral AC Joint Tenderness Thoracic Paraspinal Tenderness: T-1-T-3 Lumbar Paraspinal Tenderness: L-3-L-5 Lower Extremities: Full ROM and  Muscle Strength 5/5 Bilateral Lower Extremities Flexion Produces Pain into Lower Extremities Arises from Table Slowly Antalgic gait   Neurological: She is alert and oriented to person, place, and time.  Skin: Skin is warm and dry.  Psychiatric: She has a normal mood and affect.  Nursing note and vitals reviewed.         Assessment & Plan:  1.Multiple Sclerosis: Neurology Following: Dr. Sherryll Burger. 07/25/2016 2. Low back pain/ Neuropathic Pain: Continue Current exercise Regime. Continue using heat therapy. Continue Gabapentin and Pamelor Refilled: OxyCODONE 10/325 mg one tablet every 6 hours as need for pain # 120. 07/25/2016 We will continue the opioid monitoring program, this consists of regular clinic visits, examinations, urine drug screen, pill counts as well use of [place] Controlled Substance reporting System. 3. Muscle Spasms: Continue Tizanidine. 07/25/2016 4. Depression/anxiety:Psychiatry Following Dr. Suzie Portela: Continue Monthly Counseling. Continue Current Medication Regime. Xanax . 07/25/2016 5. Morbid obesity:Encouraged to continue with weight Loss and Healthy Living Regimen. 07/25/2016 6. Insomnia: No Complaints Today: Continue Ambien. 07/25/2016  20 minutes of face to face patient care time was spent during this visit. All questions were encouraged and answered.   F/U in 1 month

## 2016-08-21 ENCOUNTER — Ambulatory Visit (INDEPENDENT_AMBULATORY_CARE_PROVIDER_SITE_OTHER): Payer: Medicare HMO | Admitting: Primary Care

## 2016-08-21 ENCOUNTER — Telehealth: Payer: Self-pay

## 2016-08-21 ENCOUNTER — Encounter: Payer: Self-pay | Admitting: Primary Care

## 2016-08-21 VITALS — BP 122/80 | HR 67 | Temp 98.0°F | Ht 62.5 in | Wt 280.8 lb

## 2016-08-21 DIAGNOSIS — F411 Generalized anxiety disorder: Secondary | ICD-10-CM

## 2016-08-21 DIAGNOSIS — G4700 Insomnia, unspecified: Secondary | ICD-10-CM

## 2016-08-21 DIAGNOSIS — E119 Type 2 diabetes mellitus without complications: Secondary | ICD-10-CM | POA: Insufficient documentation

## 2016-08-21 DIAGNOSIS — I10 Essential (primary) hypertension: Secondary | ICD-10-CM | POA: Insufficient documentation

## 2016-08-21 DIAGNOSIS — K219 Gastro-esophageal reflux disease without esophagitis: Secondary | ICD-10-CM

## 2016-08-21 DIAGNOSIS — G35 Multiple sclerosis: Secondary | ICD-10-CM

## 2016-08-21 DIAGNOSIS — M4316 Spondylolisthesis, lumbar region: Secondary | ICD-10-CM

## 2016-08-21 DIAGNOSIS — M542 Cervicalgia: Secondary | ICD-10-CM

## 2016-08-21 NOTE — Assessment & Plan Note (Addendum)
Managed per pain management with Norco, gabapentin, Tizanidine.

## 2016-08-21 NOTE — Assessment & Plan Note (Signed)
Overall feels well on nortriptyline, using 2-3 times weekly for headaches.

## 2016-08-21 NOTE — Assessment & Plan Note (Signed)
Managed on pantoprazole 40 mg. Working on [employer] and has noticed she's not required as frequent of use. Commended her on this success.

## 2016-08-21 NOTE — Telephone Encounter (Signed)
Pt called - she does not want to return to [facility].  She was under the impression that we were a mental health provider. She wants to continue to see her previous provider for primary care. No cb needed .Thanks

## 2016-08-21 NOTE — Assessment & Plan Note (Signed)
Endorses normal A1C in June 2018. Will obtain records for last lab value. Would have her return in 3 months for repeat labs and follow up.

## 2016-08-21 NOTE — Assessment & Plan Note (Signed)
Stable in the office today, continue current regimen. 

## 2016-08-21 NOTE — Progress Notes (Signed)
Subjective:    Patient ID: Mariah Morris, female    DOB: [birth year], 46 y.o.   MRN: [id number]  HPI  Mariah Morris is a 46 year old female who presents today to establish care and discuss the problems mentioned below. Will obtain old records.  1) GAD/Insomnia: Currently managed on Cymbalta 60 mg, alprazolam 1 mg 3-4 times daily, hydroxyzine 50 mg (not taking), Ambien 10 mg. She doesn't take Ambien every night, mostly once monthly. She's tried Zoloft, Lexapro, Prozac without improvement in anxiety. She continues to experience symptoms of anxiety, feeling jittery, can't focus/concentrate, crying spells despite treatment. She was recently managed at [facility] for anxiety but was told that her insurance would no longer be accepted.   2) Multiple Sclerosis: Diagnosed 10-12 years ago. Currently managed on Rebif injection three times weekly. She's also managed on Tizanidine, gabapentin for muscle spasms. She is currently following with Neurology with [facility].   3) Type 2 Diabetes: Diagnosed 6 months ago. Currently managed on metformin 1000 mg BID. Her recent A1C was in the "6" range last month. She's been working on weight loss through improvements in diet and exercise.  4) Essential Hypertension: Currently managed on Toprol XL 50 mg, HCTZ 25 mg. Her BP in the office today is 122/80.   5) Chronic Headaches: Currently managed on nortriptyline HS for which she'll take 2-3 times weekly as needed.   6) Chronic Back Pain: Currently managed on Percocet 10-325 mg through pain management.   7) GERD: Currently managed on pantoprazole 40 mg once daily, takes several times weekly as she tries to avoid trigger foods. She will experience symptoms of esophageal burning, esophageal pain.   Review of Systems  Constitutional: Negative for unexpected weight change.  Eyes: Negative for visual disturbance.  Respiratory: Negative for shortness of breath.   Cardiovascular: Negative for chest pain.    Gastrointestinal:       GERD  Genitourinary: Negative for menstrual problem.  Musculoskeletal: Positive for back pain.  Skin: Negative for color change.  Neurological: Positive for headaches.       Headaches overall managed with nortriptyline.    Psychiatric/Behavioral: The patient is nervous/anxious.        See HPI       Past Medical History:  Diagnosis Date  . Chronic back pain   . GAD (generalized anxiety disorder)   . GERD (gastroesophageal reflux disease)   . Gestational diabetes   . Hypertension   . Neuromuscular disorder (HCC) 2011   MS  . Presence of permanent cardiac pacemaker   . Seizures [facility])      Social History   Social History  . Marital status: Single    Spouse name: N/A  . Number of children: N/A  . Years of education: N/A   Occupational History  . Not on file.   Social History Main Topics  . Smoking status: Never Smoker  . Smokeless tobacco: Never Used  . Alcohol use No  . Drug use: Unknown  . Sexual activity: Not on file   Other Topics Concern  . Not on file   Social History Narrative  . No narrative on file    Past Surgical History:  Procedure Laterality Date  . CESAREAN SECTION     x2  . OVARIAN CYST REMOVAL      Family History  Problem Relation Age of Onset  . Hypertension Mother   . Diabetes Father   . Hypertension Father     Allergies  Allergen Reactions  .  Advil [Ibuprofen] Itching    Current Outpatient Prescriptions on File Prior to Visit  Medication Sig Dispense Refill  . ALPRAZolam (XANAX) 1 MG tablet Take 1 mg by mouth 4 (four) times daily as needed for anxiety. Dr Laroy Apple rx     . DULoxetine (CYMBALTA) 60 MG capsule Take 60 mg by mouth daily.  3  . Ferrous Sulfate (IRON) 325 (65 Fe) MG TABS Take 2 tablets by mouth daily.    . fluticasone (FLONASE) 50 MCG/ACT nasal spray Place 1 spray into both nostrils daily as needed for allergies.     Marland Kitchen gabapentin (NEURONTIN) 600 MG tablet Take 1 tablet (600 mg total) by  mouth 3 (three) times daily. 90 tablet 4  . hydrOXYzine (ATARAX/VISTARIL) 50 MG tablet Take 1 tablet (50 mg total) by mouth 3 (three) times daily as needed for anxiety. 21 tablet 0  . interferon beta-1a (REBIF) 44 MCG/0.5ML injection Inject 44 mcg into the skin 3 (three) times a week.    . metFORMIN (GLUCOPHAGE) 1000 MG tablet Take 1 tablet by mouth 2 (two) times daily.    . metoprolol succinate (TOPROL-XL) 50 MG 24 hr tablet TAKE 1 TABLET (50 MG TOTAL) BY MOUTH NIGHTLY.  3  . nortriptyline (PAMELOR) 10 MG capsule Take 10 mg by mouth 2 (two) times daily.    Marland Kitchen oxyCODONE-acetaminophen (PERCOCET) 10-325 MG tablet Take 1 tablet by mouth every 6 (six) hours as needed. 120 tablet 0  . pantoprazole (PROTONIX) 40 MG tablet Take 1 tablet (40 mg total) by mouth daily.    Marland Kitchen tiZANidine (ZANAFLEX) 4 MG tablet Take 1 tablet (4 mg total) by mouth 3 (three) times daily. 90 tablet 4  . zolpidem (AMBIEN) 10 MG tablet Take 10 mg by mouth at bedtime as needed.     Marland Kitchen acetaminophen (TYLENOL) 650 MG CR tablet Take 650 mg by mouth as needed for pain.    . hydrochlorothiazide (HYDRODIURIL) 25 MG tablet Take 1 tablet (25 mg total) by mouth daily. (Patient not taking: Reported on 08/21/2016)     No current facility-administered medications on file prior to visit.     BP 122/80   Pulse 67   Temp 98 F (36.7 C) (Oral)   Ht 5' 2.5" (1.588 m)   Wt 280 lb 12.8 oz (127.4 kg)   LMP 07/22/2016   SpO2 97%   BMI 50.54 kg/m    Objective:   Physical Exam  Constitutional: She is oriented to person, place, and time. She appears well-nourished.  Neck: Neck supple.  Cardiovascular: Normal rate and regular rhythm.   Pulmonary/Chest: Effort normal and breath sounds normal.  Abdominal: Soft.  Neurological: She is alert and oriented to person, place, and time.  Skin: Skin is warm and dry.  Psychiatric: She has a normal mood and affect.          Assessment & Plan:

## 2016-08-21 NOTE — Patient Instructions (Signed)
Stop by the front desk and speak with either Shirlee Limerick or Zella Ball regarding your referral to psychiatry.  Schedule a follow up visit in 3 months to recheck labs. Come fasting to this appointment. You may have water and black coffee only.  It was a pleasure to meet you today! Please don't hesitate to call me with any questions. Welcome to [facility]!

## 2016-08-21 NOTE — Assessment & Plan Note (Signed)
Uses Ambien once to twice monthly on average. Discussed potential side effects of this medication.

## 2016-08-21 NOTE — Assessment & Plan Note (Signed)
Following with Neurology through [facility]. Feels well managed.

## 2016-08-21 NOTE — Assessment & Plan Note (Signed)
Uncontrolled despite use of Cymbalta and Xanax four times daily. Strongly discouraged Xanax use. Failed numerous treatments in the past. Recently following with [facility] until her insurance was no longer accepted.   Discussed that I do not feel comfortable with her Xanax use, uncontrolled anxiety despite several attempts for treatment in the past, and that she will need to establish with psychiatry. Information provided for [facility]. Referral placed to psychiatry.

## 2016-08-22 ENCOUNTER — Encounter: Payer: Medicare HMO | Attending: Physical Medicine & Rehabilitation | Admitting: Registered Nurse

## 2016-08-22 ENCOUNTER — Encounter: Payer: Self-pay | Admitting: Registered Nurse

## 2016-08-22 VITALS — BP 107/73 | HR 83 | Temp 98.5°F

## 2016-08-22 DIAGNOSIS — Z5181 Encounter for therapeutic drug level monitoring: Secondary | ICD-10-CM

## 2016-08-22 DIAGNOSIS — M792 Neuralgia and neuritis, unspecified: Secondary | ICD-10-CM | POA: Diagnosis present

## 2016-08-22 DIAGNOSIS — M7061 Trochanteric bursitis, right hip: Secondary | ICD-10-CM

## 2016-08-22 DIAGNOSIS — Z79899 Other long term (current) drug therapy: Secondary | ICD-10-CM

## 2016-08-22 DIAGNOSIS — M7062 Trochanteric bursitis, left hip: Secondary | ICD-10-CM

## 2016-08-22 DIAGNOSIS — G894 Chronic pain syndrome: Secondary | ICD-10-CM | POA: Diagnosis not present

## 2016-08-22 DIAGNOSIS — G4709 Other insomnia: Secondary | ICD-10-CM | POA: Diagnosis not present

## 2016-08-22 DIAGNOSIS — G35 Multiple sclerosis: Secondary | ICD-10-CM | POA: Insufficient documentation

## 2016-08-22 DIAGNOSIS — M4316 Spondylolisthesis, lumbar region: Secondary | ICD-10-CM | POA: Insufficient documentation

## 2016-08-22 DIAGNOSIS — M545 Low back pain: Secondary | ICD-10-CM | POA: Diagnosis present

## 2016-08-22 MED ORDER — OXYCODONE-ACETAMINOPHEN 10-325 MG PO TABS: 1.0000 | ORAL_TABLET | Freq: Four times a day (QID) | ORAL | 0 refills | Status: DC | PRN

## 2016-08-22 NOTE — Progress Notes (Signed)
Subjective:    Patient ID: Mariah Morris, female    DOB: [birth year], 46 y.o.   MRN: [id number]  HPI:  Mariah Morris is a 46year old female who returns for follow up appointmentfor chronic pain and medication refill. She states her pain is located in herlower back radiating into her bilaterallower extremities R>L and bilateral  hips.She rates her pain 7. Her current exercise regime is walking in the park twice a day, chair exercises and performing stretching exercises.  Also states she has lost 20 lbs, encouraged to continue with Healthy Diet regime.  Last UDS was performed on 04/26/2016 it was consistent.   Pain Inventory Average Pain 7 Pain Right Now 7 My pain is constant, burning, stabbing, tingling and aching  In the last 24 hours, has pain interfered with the following? General activity 5 Relation with others 5 Enjoyment of life 5 What TIME of day is your pain at its worst? evening, night Sleep (in general) Fair  Pain is worse with: walking and bending Pain improves with: rest, heat/ice, therapy/exercise and medication Relief from Meds: 8  Mobility use a cane ability to climb steps?  no do you drive?  no Do you have any goals in this area?  no  Function I need assistance with the following:  dressing, bathing, meal prep, household duties and shopping  Neuro/Psych numbness tingling trouble walking spasms confusion depression anxiety  Prior Studies .  Physicians involved in your care .   Family History  Problem Relation Age of Onset  . Hypertension Mother   . Diabetes Father   . Hypertension Father    Social History   Social History  . Marital status: Single    Spouse name: N/A  . Number of children: N/A  . Years of education: N/A   Social History Main Topics  . Smoking status: Never Smoker  . Smokeless tobacco: Never Used  . Alcohol use No  . Drug use: Unknown  . Sexual activity: Not Asked   Other Topics Concern  . None    Social History Narrative  . None   Past Surgical History:  Procedure Laterality Date  . CESAREAN SECTION     x2  . OVARIAN CYST REMOVAL     Past Medical History:  Diagnosis Date  . Chronic back pain   . GAD (generalized anxiety disorder)   . GERD (gastroesophageal reflux disease)   . Gestational diabetes   . Hypertension   . Neuromuscular disorder (HCC) 2011   MS  . Presence of permanent cardiac pacemaker   . Seizures (HCC)    BP 107/73   Pulse 83   Temp 98.5 F (36.9 C)   SpO2 98%   Opioid Risk Score:   Fall Risk Score:  `1  Depression screen PHQ 2/9  Depression screen [facility] 2/9 05/22/2016 03/27/2016 07/19/2015 02/15/2015 10/12/2014 08/21/2014 04/28/2014  Decreased Interest 0 1 0 1 1 1 1   Down, Depressed, Hopeless 1 1 0 1 1 1 1   PHQ - 2 Score 1 2 0 2 2 2 2   Altered sleeping - - - - - - 2  Tired, decreased energy - - - - - - 3  Change in appetite - - - - - - 3  Feeling bad or failure about yourself  - - - - - - 0  Trouble concentrating - - - - - - 3  Moving slowly or fidgety/restless - - - - - - 0  Suicidal thoughts - - - - - -  0  PHQ-9 Score - - - - - - 13     Review of Systems  Constitutional: Negative.   HENT: Negative.   Eyes: Negative.   Respiratory: Negative.   Cardiovascular: Negative.   Gastrointestinal: Negative.   Endocrine: Negative.   Genitourinary: Negative.   Musculoskeletal: Negative.   Skin: Negative.   Allergic/Immunologic: Negative.   Neurological: Negative.   Hematological: Negative.   Psychiatric/Behavioral: Negative.   All other systems reviewed and are negative.      Objective:   Physical Exam  Constitutional: She is oriented to person, place, and time. She appears well-developed and well-nourished.  HENT:  Head: Normocephalic and atraumatic.  Neck: Normal range of motion. Neck supple.  Cardiovascular: Normal rate and regular rhythm.   Pulmonary/Chest: Effort normal and breath sounds normal.  Musculoskeletal:  Normal Muscle  Bulk and Muscle testing Reveals: Upper Extremities: Full ROM and Muscle Strength 5/5 Thoracic Paraspinal Tenderness: T-7-T-9 Lumbar Paraspinal Tenderness: L-3-L-5 Bilateral Greater Trochanter Tenderness Lower Extremities: Full ROM and Muscle Strength 5/5 Right Lower Extremity Flexion Produces Pain into Right Extremity Arises from Table Slowly Narrow Based Gait  Neurological: She is alert and oriented to person, place, and time.  Skin: Skin is warm and dry.  Psychiatric: She has a normal mood and affect.  Nursing note and vitals reviewed.         Assessment & Plan:  1.Multiple Sclerosis: Neurology Following: Dr. Sherryll Burger. 08/22/2016 2. Low back pain/ Neuropathic Pain: Continue Current exercise Regime. Continue using heat therapy. Continue Gabapentin and Pamelor Refilled: OxyCODONE 10/325 mg one tablet every 6 hours as need for pain # 120. 08/22/2016 We will continue the opioid monitoring program, this consists of regular clinic visits, examinations, urine drug screen, pill counts as well use of [place] Controlled Substance reporting System. 3. Muscle Spasms: Continue Tizanidine. 08/22/2016 4. Depression/anxiety:Psychiatry Following Dr. Suzie Portela: Continue Monthly Counseling. Continue Current Medication Regime. Xanax . 08/22/2016 5. Morbid obesity:Encouraged to continue with weight Loss and Healthy Living Regimen. 08/22/2016 6. Insomnia: No Complaints Today: Continue Ambien. 08/22/2016  20 minutes of face to face patient care time was spent during this visit. All questions were encouraged and answered.   F/U in 1 month

## 2016-09-22 ENCOUNTER — Encounter: Payer: Self-pay | Admitting: Registered Nurse

## 2016-09-22 ENCOUNTER — Telehealth: Payer: Self-pay | Admitting: Registered Nurse

## 2016-09-22 ENCOUNTER — Encounter: Payer: Medicare HMO | Attending: Physical Medicine & Rehabilitation | Admitting: Registered Nurse

## 2016-09-22 VITALS — BP 118/80 | HR 82

## 2016-09-22 DIAGNOSIS — G894 Chronic pain syndrome: Secondary | ICD-10-CM | POA: Diagnosis not present

## 2016-09-22 DIAGNOSIS — M4316 Spondylolisthesis, lumbar region: Secondary | ICD-10-CM | POA: Insufficient documentation

## 2016-09-22 DIAGNOSIS — Z79899 Other long term (current) drug therapy: Secondary | ICD-10-CM | POA: Diagnosis not present

## 2016-09-22 DIAGNOSIS — Z5181 Encounter for therapeutic drug level monitoring: Secondary | ICD-10-CM | POA: Diagnosis not present

## 2016-09-22 DIAGNOSIS — M545 Low back pain: Secondary | ICD-10-CM | POA: Diagnosis present

## 2016-09-22 DIAGNOSIS — G35 Multiple sclerosis: Secondary | ICD-10-CM

## 2016-09-22 DIAGNOSIS — M792 Neuralgia and neuritis, unspecified: Secondary | ICD-10-CM | POA: Insufficient documentation

## 2016-09-22 MED ORDER — OXYCODONE-ACETAMINOPHEN 10-325 MG PO TABS: 1.0000 | ORAL_TABLET | Freq: Four times a day (QID) | ORAL | 0 refills | Status: DC | PRN

## 2016-09-22 NOTE — Progress Notes (Signed)
Subjective:    Patient ID: Mariah Morris, female    DOB: [birth year], 46 y.o.   MRN: [id number]  HPI: Mariah Morris is a 46year old female who returns for follow up appointmentfor chronic pain and medication refill. She states her pain is located in herlower back radiating into her bilaterallower extremities R>L. She rates her pain 7. Her current exercise regime is walking in the park twice a day,chair exercises and performing stretching exercises.  Also states she has lost a few more  lbs, encouraged to continue with Healthy Diet and Exercise regime.  Last UDS was performed on 04/26/2016 it was consistent.   Pain Inventory Average Pain 7 Pain Right Now 7 My pain is constant, sharp, burning, dull, stabbing and aching  In the last 24 hours, has pain interfered with the following? General activity 6 Relation with others 6 Enjoyment of life 6 What TIME of day is your pain at its worst? evening Sleep (in general) Fair  Pain is worse with: walking, bending and standing Pain improves with: rest, heat/ice, therapy/exercise and medication Relief from Meds: 7  Mobility walk without assistance  Function Do you have any goals in this area?  no  Neuro/Psych No problems in this area  Prior Studies Any changes since last visit?  no  Physicians involved in your care Any changes since last visit?  no   Family History  Problem Relation Age of Onset  . Hypertension Mother   . Diabetes Father   . Hypertension Father    Social History   Social History  . Marital status: Single    Spouse name: N/A  . Number of children: N/A  . Years of education: N/A   Social History Main Topics  . Smoking status: Never Smoker  . Smokeless tobacco: Never Used  . Alcohol use No  . Drug use: Unknown  . Sexual activity: Not Asked   Other Topics Concern  . None   Social History Narrative  . None   Past Surgical History:  Procedure Laterality Date  . CESAREAN  SECTION     x2  . OVARIAN CYST REMOVAL     Past Medical History:  Diagnosis Date  . Chronic back pain   . GAD (generalized anxiety disorder)   . GERD (gastroesophageal reflux disease)   . Gestational diabetes   . Hypertension   . Neuromuscular disorder (HCC) 2011   MS  . Presence of permanent cardiac pacemaker   . Seizures (HCC)    BP 118/80   Pulse 82   SpO2 98%   Opioid Risk Score:   Fall Risk Score:  `1  Depression screen PHQ 2/9  Depression screen [facility] 2/9 05/22/2016 03/27/2016 07/19/2015 02/15/2015 10/12/2014 08/21/2014 04/28/2014  Decreased Interest 0 1 0 1 1 1 1   Down, Depressed, Hopeless 1 1 0 1 1 1 1   PHQ - 2 Score 1 2 0 2 2 2 2   Altered sleeping - - - - - - 2  Tired, decreased energy - - - - - - 3  Change in appetite - - - - - - 3  Feeling bad or failure about yourself  - - - - - - 0  Trouble concentrating - - - - - - 3  Moving slowly or fidgety/restless - - - - - - 0  Suicidal thoughts - - - - - - 0  PHQ-9 Score - - - - - - 13     Review of Systems  Constitutional:  Negative.   HENT: Negative.   Eyes: Negative.   Respiratory: Negative.   Cardiovascular: Negative.   Gastrointestinal: Negative.   Endocrine: Negative.   Genitourinary: Negative.   Musculoskeletal: Negative.   Skin: Negative.   Allergic/Immunologic: Negative.   Neurological: Negative.   Hematological: Negative.   Psychiatric/Behavioral: Negative.   All other systems reviewed and are negative.      Objective:   Physical Exam  Constitutional: She is oriented to person, place, and time. She appears well-developed and well-nourished.  HENT:  Head: Normocephalic and atraumatic.  Neck: Normal range of motion. Neck supple.  Cardiovascular: Normal rate and regular rhythm.   Pulmonary/Chest: Effort normal and breath sounds normal.  Musculoskeletal:  Normal Muscle Bulk and Muscle Testing Reveals: Upper Extremities: Full ROM and Muscle Strength 5/5 Right AC Joint Tenderness Thoracic Paraspinal  Tenderness: T-1-T-3 Lumbar Paraspinal Tenderness: L-4-L-5 Lower Extremities: Full ROM and Muscle Strength 5/5 Right Lower Extremity Flexion Produces Pain into Extremity Arises from Table Slowly Narrow Based Gait  Neurological: She is alert and oriented to person, place, and time.  Skin: Skin is warm and dry.  Psychiatric: She has a normal mood and affect.  Nursing note and vitals reviewed.         Assessment & Plan:  1.Multiple Sclerosis: Neurology Following: Dr. Sherryll Burger. 09/22/2016 2. Low back pain/ Neuropathic Pain: Continue Current exercise Regime. Continue using heat therapy. Continue Gabapentin and Pamelor Refilled: OxyCODONE 10/325 mg one tablet every 6 hours as need for pain # 120. 09/22/2016 We will continue the opioid monitoring program, this consists of regular clinic visits, examinations, urine drug screen, pill counts as well use of [place] Controlled Substance reporting System. 3. Muscle Spasms: Continue Tizanidine. 09/22/2016 4. Depression/anxiety:Psychiatry Following Dr. Suzie Portela: Continue Monthly Counseling. Continue Current Medication Regime. Xanax . 09/22/2016 5. Morbid obesity:Encouraged to continue with weight Loss and Healthy Living Regimen. 09/22/2016 6. Insomnia: No complaints Today. 08/22/2016  20 minutes of face to face patient care time was spent during this visit. All questions were encouraged and answered.   F/U in 1 month

## 2016-09-22 NOTE — Telephone Encounter (Signed)
On 09/22/2016 the NCCSR was reviewed no conflict was seen on the [facility] Controlled Substance Reporting System with multiple prescribers.Ms. Broadwell has a Photographer with our office. If there were any discrepancies this would have been reported to her physician.

## 2016-09-28 ENCOUNTER — Ambulatory Visit: Payer: Self-pay | Admitting: Family Medicine

## 2016-09-29 LAB — DRUG TOX ALC METAB W/CON, ORAL FLD: ALCOHOL METABOLITE: NEGATIVE ng/mL (ref <25)

## 2016-09-29 LAB — DRUG TOX MONITOR 1 W/CONF, ORAL FLD
AMPHETAMINES: NEGATIVE ng/mL (ref <10)
BARBITURATES: NEGATIVE ng/mL (ref <10)
BENZODIAZEPINES: NEGATIVE ng/mL (ref <0.50)
BUPRENORPHINE: NEGATIVE ng/mL (ref <0.025)
COCAINE: NEGATIVE ng/mL (ref <2.5)
Codeine: NEGATIVE ng/mL (ref <2.5)
DIHYDROCODEINE: NEGATIVE ng/mL (ref <2.5)
Fentanyl: NEGATIVE ng/mL (ref <0.10)
HEROIN METABOLITE: NEGATIVE ng/mL (ref <1.0)
HYDROMORPHONE: NEGATIVE ng/mL (ref <2.5)
Hydrocodone: NEGATIVE ng/mL (ref <2.5)
MARIJUANA: NEGATIVE ng/mL (ref <2.5)
MDMA: NEGATIVE ng/mL (ref <10)
METHADONE: NEGATIVE ng/mL (ref <5.0)
MORPHINE: NEGATIVE ng/mL (ref <2.5)
Meperidine: NEGATIVE ng/mL (ref <5.0)
Meprobamate: NEGATIVE ng/mL (ref <2.5)
NICOTINE METABOLITE: NEGATIVE ng/mL (ref <5.0)
NORHYDROCODONE: NEGATIVE ng/mL (ref <2.5)
Noroxycodone: NEGATIVE ng/mL (ref <2.5)
OXYCODONE: 8.6 ng/mL — AB (ref <2.5)
Opiates: POSITIVE ng/mL — AB (ref <2.5)
Oxymorphone: NEGATIVE ng/mL (ref <2.5)
PROPOXYPHENE: NEGATIVE ng/mL (ref <5.0)
Phencyclidine: NEGATIVE ng/mL (ref <10)
TAPENTADOL: NEGATIVE ng/mL (ref <5.0)
TRAMADOL: NEGATIVE ng/mL (ref <5.0)
Zolpidem: NEGATIVE ng/mL (ref <5.0)

## 2016-10-03 ENCOUNTER — Telehealth: Payer: Self-pay | Admitting: *Deleted

## 2016-10-03 NOTE — Telephone Encounter (Signed)
Oral swab drug screen was consistent for prescribed medications.  ?

## 2016-10-19 ENCOUNTER — Encounter: Payer: Medicare HMO | Attending: Physical Medicine & Rehabilitation | Admitting: Registered Nurse

## 2016-10-19 ENCOUNTER — Encounter: Payer: Self-pay | Admitting: Registered Nurse

## 2016-10-19 VITALS — BP 124/87 | HR 66

## 2016-10-19 DIAGNOSIS — G35D Multiple sclerosis, unspecified: Secondary | ICD-10-CM

## 2016-10-19 DIAGNOSIS — Z5181 Encounter for therapeutic drug level monitoring: Secondary | ICD-10-CM | POA: Diagnosis not present

## 2016-10-19 DIAGNOSIS — G894 Chronic pain syndrome: Secondary | ICD-10-CM

## 2016-10-19 DIAGNOSIS — M5416 Radiculopathy, lumbar region: Secondary | ICD-10-CM | POA: Diagnosis not present

## 2016-10-19 DIAGNOSIS — M792 Neuralgia and neuritis, unspecified: Secondary | ICD-10-CM | POA: Diagnosis present

## 2016-10-19 DIAGNOSIS — Z79899 Other long term (current) drug therapy: Secondary | ICD-10-CM | POA: Diagnosis not present

## 2016-10-19 DIAGNOSIS — M545 Low back pain: Secondary | ICD-10-CM | POA: Insufficient documentation

## 2016-10-19 DIAGNOSIS — G35 Multiple sclerosis: Secondary | ICD-10-CM

## 2016-10-19 DIAGNOSIS — M62838 Other muscle spasm: Secondary | ICD-10-CM | POA: Diagnosis not present

## 2016-10-19 DIAGNOSIS — M4316 Spondylolisthesis, lumbar region: Secondary | ICD-10-CM

## 2016-10-19 DIAGNOSIS — G4709 Other insomnia: Secondary | ICD-10-CM | POA: Diagnosis not present

## 2016-10-19 MED ORDER — OXYCODONE-ACETAMINOPHEN 10-325 MG PO TABS: 1.0000 | ORAL_TABLET | Freq: Four times a day (QID) | ORAL | 0 refills | Status: DC | PRN

## 2016-10-19 NOTE — Progress Notes (Signed)
Subjective:    Patient ID: Mariah Morris, female    DOB: [birth year], 46 y.o.   MRN: [id number]  HPI: [name] is a 46year old female who returns for follow up appointmentfor chronic pain and medication refill. She states her pain is located in herlower back radiating into her bilaterallower extremities.  Also states for the last week her pain had intensified and she admits to taking an extra Oxycodone for a few days. Reviewed the narcotic policy and educated on self escalating of medication. She is aware if this occurs again she will be discharged. She verbalizes understanding. She rates her pain 7. Her usual exercise regime is walking in the park twice a day,chair exercises and performing stretching exercises, states she hasn't been able to exercise due to increase intensity of pain. She will be resuming her exercise regime this week she states.   Oral Swab  was performed on 09/22/2016 it was consistent.   Pain Inventory Average Pain 7 Pain Right Now 7 My pain is constant, burning, stabbing and aching  In the last 24 hours, has pain interfered with the following? General activity 6 Relation with others 6 Enjoyment of life 6 What TIME of day is your pain at its worst? evening, night Sleep (in general) Fair  Pain is worse with: walking, bending and standing Pain improves with: rest, heat/ice, therapy/exercise and medication Relief from Meds: 8  Mobility walk without assistance walk with assistance Do you have any goals in this area?  no  Function Do you have any goals in this area?  no  Neuro/Psych numbness tingling trouble walking spasms confusion anxiety  Prior Studies Any changes since last visit?  no  Physicians involved in your care Any changes since last visit?  no   Family History  Problem Relation Age of Onset  . Hypertension Mother   . Diabetes Father   . Hypertension Father    Social History   Social History  . Marital  status: Single    Spouse name: N/A  . Number of children: N/A  . Years of education: N/A   Social History Main Topics  . Smoking status: Never Smoker  . Smokeless tobacco: Never Used  . Alcohol use No  . Drug use: Unknown  . Sexual activity: Not on file   Other Topics Concern  . Not on file   Social History Narrative  . No narrative on file   Past Surgical History:  Procedure Laterality Date  . CESAREAN SECTION     x2  . OVARIAN CYST REMOVAL     Past Medical History:  Diagnosis Date  . Chronic back pain   . GAD (generalized anxiety disorder)   . GERD (gastroesophageal reflux disease)   . Gestational diabetes   . Hypertension   . Neuromuscular disorder (HCC) 2011   MS  . Presence of permanent cardiac pacemaker   . Seizures (HCC)    There were no vitals taken for this visit.  Opioid Risk Score:   Fall Risk Score:  `1  Depression screen PHQ 2/9  Depression screen [facility] 2/9 05/22/2016 03/27/2016 07/19/2015 02/15/2015 10/12/2014 08/21/2014 04/28/2014  Decreased Interest 0 1 0 1 1 1 1   Down, Depressed, Hopeless 1 1 0 1 1 1 1   PHQ - 2 Score 1 2 0 2 2 2 2   Altered sleeping - - - - - - 2  Tired, decreased energy - - - - - - 3  Change in appetite - - - - - -  3  Feeling bad or failure about yourself  - - - - - - 0  Trouble concentrating - - - - - - 3  Moving slowly or fidgety/restless - - - - - - 0  Suicidal thoughts - - - - - - 0  PHQ-9 Score - - - - - - 13     Review of Systems  Constitutional: Negative.   HENT: Negative.   Eyes: Negative.   Respiratory: Negative.   Cardiovascular: Negative.   Gastrointestinal: Negative.   Endocrine: Negative.   Genitourinary: Negative.   Musculoskeletal: Positive for arthralgias, back pain, gait problem, neck pain and neck stiffness.       Spasms )  Skin: Negative.   Allergic/Immunologic: Negative.   Neurological: Positive for numbness.       Tingling  Hematological: Negative.   Psychiatric/Behavioral: Positive for confusion.  The patient is nervous/anxious.   All other systems reviewed and are negative.      Objective:   Physical Exam  Constitutional: She is oriented to person, place, and time. She appears well-developed and well-nourished.  HENT:  Head: Normocephalic and atraumatic.  Neck: Normal range of motion. Neck supple.  Cardiovascular: Normal rate and regular rhythm.   Pulmonary/Chest: Effort normal and breath sounds normal.  Musculoskeletal:  Normal Muscle Bulk and Muscle Testing Reveals: Upper Extremities: Full ROM and Muscle Strength 5/5 Thoracic Paraspinal Tenderness: T-7-T-9 Lumbar Paraspinal Hypersensitivity Lower Extremities: Full ROM and Muscle Strength 5/5 Arises from Table Slowly Antalgic Gait  Neurological: She is alert and oriented to person, place, and time.  Skin: Skin is warm and dry.  Psychiatric: She has a normal mood and affect.  Nursing note and vitals reviewed.         Assessment & Plan:  1.Multiple Sclerosis: Neurology Following: Dr. Sherryll Burger. 10/19/2016 2. Low back pain/ Lumbar Radiculitis/ Neuropathic Pain: Continue Current exercise Regime. Continue using heat therapy. Continue Gabapentin and Pamelor Refilled: OxyCODONE 10/325 mg one tablet every 6 hours as need for pain # 120. 10/19/2016 We will continue the opioid monitoring program, this consists of regular clinic visits, examinations, urine drug screen, pill counts as well use of [place] Controlled Substance reporting System. 3. Muscle Spasms: Continue Tizanidine. 10/19/2016 4. Depression/anxiety:Psychiatry Following Dr. Suzie Portela: Continue Monthly Counseling. Continue Current Medication Regime. Xanax . 10/19/2016 5. Morbid obesity:Encouraged to continue with weight Loss and Healthy Living Regimen. 10/19/2016 6. Insomnia: No complaints Today. Continue Ambien. 10/19/2016  20 minutes of face to face patient care time was spent during this visit. All questions were encouraged and answered.  F/U in 1  month

## 2016-10-20 ENCOUNTER — Ambulatory Visit: Payer: Self-pay | Admitting: Registered Nurse

## 2016-11-15 ENCOUNTER — Encounter: Payer: Self-pay | Admitting: Physical Medicine & Rehabilitation

## 2016-11-15 ENCOUNTER — Encounter: Payer: Medicare Other | Attending: Physical Medicine & Rehabilitation | Admitting: Physical Medicine & Rehabilitation

## 2016-11-15 VITALS — BP 118/83 | HR 73

## 2016-11-15 DIAGNOSIS — Z5181 Encounter for therapeutic drug level monitoring: Secondary | ICD-10-CM

## 2016-11-15 DIAGNOSIS — G35 Multiple sclerosis: Secondary | ICD-10-CM | POA: Insufficient documentation

## 2016-11-15 DIAGNOSIS — G894 Chronic pain syndrome: Secondary | ICD-10-CM | POA: Diagnosis not present

## 2016-11-15 DIAGNOSIS — M545 Low back pain, unspecified: Secondary | ICD-10-CM

## 2016-11-15 DIAGNOSIS — Z79899 Other long term (current) drug therapy: Secondary | ICD-10-CM

## 2016-11-15 DIAGNOSIS — M792 Neuralgia and neuritis, unspecified: Secondary | ICD-10-CM

## 2016-11-15 DIAGNOSIS — M4316 Spondylolisthesis, lumbar region: Secondary | ICD-10-CM | POA: Diagnosis not present

## 2016-11-15 MED ORDER — OXYCODONE-ACETAMINOPHEN 10-325 MG PO TABS: 1.0000 | ORAL_TABLET | Freq: Three times a day (TID) | ORAL | 0 refills | Status: DC | PRN

## 2016-11-15 MED ORDER — FENTANYL 12 MCG/HR TD PT72: 12.5000 ug | MEDICATED_PATCH | TRANSDERMAL | 0 refills | Status: DC

## 2016-11-15 NOTE — Patient Instructions (Signed)
PLEASE FEEL FREE TO CALL OUR OFFICE WITH ANY PROBLEMS OR QUESTIONS ([phone])      

## 2016-11-15 NOTE — Progress Notes (Signed)
Subjective:    Patient ID: Mariah Morris, female    DOB: 11/03/1970, 46 y.o.   MRN: [id number]  HPI   Mariah Morris is here in follow up of her MS and chronic pain. She has lost over 30lbs approximately this year. It hasn't necessarily helped her pain but she is moving more easily, has more energy, is in a better mood, lower cbg's,  etc.   For pain she is using percocet 10 three x daily. She finds these help but don't provide consistent results. She would like to use something long-acting for pain control. She continues on tizanidine and gabapentin for spasms and nerve pain.   She is managing her bowel function nicely. She eating fruits and fiber to stay regular.     Pain Inventory Average Pain 7 Pain Right Now 7 My pain is constant, burning, stabbing and aching  In the last 24 hours, has pain interfered with the following? General activity 6 Relation with others 6 Enjoyment of life 6 What TIME of day is your pain at its worst? night Sleep (in general) Fair  Pain is worse with: walking, bending and sitting Pain improves with: rest, heat/ice and medication Relief from Meds: 8  Mobility walk without assistance walk with assistance use a cane  Function Do you have any goals in this area?  no  Neuro/Psych numbness trouble walking spasms confusion anxiety  Prior Studies Any changes since last visit?  no  Physicians involved in your care Any changes since last visit?  no   Family History  Problem Relation Age of Onset  . Hypertension Mother   . Diabetes Father   . Hypertension Father    Social History   Social History  . Marital status: Single    Spouse name: N/A  . Number of children: N/A  . Years of education: N/A   Social History Main Topics  . Smoking status: Never Smoker  . Smokeless tobacco: Never Used  . Alcohol use No  . Drug use: Unknown  . Sexual activity: Not Asked   Other Topics Concern  . None   Social History Narrative  . None    Past Surgical History:  Procedure Laterality Date  . CESAREAN SECTION     x2  . OVARIAN CYST REMOVAL     Past Medical History:  Diagnosis Date  . Chronic back pain   . GAD (generalized anxiety disorder)   . GERD (gastroesophageal reflux disease)   . Gestational diabetes   . Hypertension   . Neuromuscular disorder (HCC) 2011   MS  . Presence of permanent cardiac pacemaker   . Seizures (HCC)    BP 118/83   Pulse 73   SpO2 96%   Opioid Risk Score:   Fall Risk Score:  `1  Depression screen PHQ 2/9  [facility] 05/22/2016 03/27/2016 07/19/2015 02/15/2015 10/12/2014 08/21/2014 04/28/2014  Decreased Interest 0 1 0 1 1 1 1   Down, Depressed, Hopeless 1 1 0 1 1 1 1   PHQ - 2 Score 1 2 0 2 2 2 2   Altered sleeping - - - - - - 2  Tired, decreased energy - - - - - - 3  Change in appetite - - - - - - 3  Feeling bad or failure about yourself  - - - - - - 0  Trouble concentrating - - - - - - 3  Moving slowly or fidgety/restless - - - - - - 0  Suicidal thoughts - - - - - -  0  PHQ-9 Score - - - - - - 13     Review of Systems  Constitutional: Negative.   HENT: Negative.   Eyes: Negative.   Respiratory: Positive for cough.   Gastrointestinal: Negative.   Endocrine: Negative.   Genitourinary: Negative.   Musculoskeletal: Negative.   Skin: Negative.   Allergic/Immunologic: Negative.   Neurological: Negative.   Hematological: Negative.   Psychiatric/Behavioral: Negative.   All other systems reviewed and are negative.      Objective:   Physical Exam  Constitutional:   Morbidly [place] has lost noticeable weight HENT:  Head: Normocephalic.  Neck: Neck supple.  Musculoskeletal: She exhibits tenderness. In all limbs. Flexed posture improved but still flexed in lumbar spine at about 10degrees Neurological: She is alert and oriented to person, place, and time.   Symmetric normal motor tone is noted throughout. Normal muscle bulk. Muscle testing reveals 4-5/5 muscle  strength of the upper extremity, and 4-5/5 of the lower extremity.  Sensory is decreased to light touch, on the right arm/leg.  DTR 1 to 2+.   Psych: pleasant and appropriate     Assessment: 1. MS-relaxing remitting  2. Low back pain, with documented Grade 1 spondylolisthesis of L4 on L5, With maladaptive posture  3. Right sided pain, neck, shoulder back and leg in a diffuse, non radicular pattern, most likely MS related  4. Depression/anxiety  5. Morbid obesity    Plan:  1. contineu with dietary efforts and exercise. She's doing a great job!! 2. For neuropathic leg pain,continue gabapentin at 600mg  TID (2 doses in the evening --dinner and bedtime) 3. Ambien  4. Zanaflex 4mg  TID.for spasms.  5. Percocet continues at 10/325 but decrease to q8 prn #90 with goal of q12 prn #60.  6. Introduce fentanyl patch q 72 hours #10, consider increase to depending upon response.  We will continue the opioid monitoring program, this consists of regular clinic visits, examinations, routine drug screening, pill counts as well as use of [place] Controlled Substance Reporting System. NCCSRS was reviewed today.   7. Follow up with me or NP in About 1 months. 15 minutes of face to face patient care time were spent during this visit. All questions were encouraged and answered.

## 2016-11-16 ENCOUNTER — Ambulatory Visit: Payer: Self-pay | Admitting: Licensed Clinical Social Worker

## 2016-11-21 ENCOUNTER — Ambulatory Visit: Payer: Self-pay | Admitting: Physical Medicine & Rehabilitation

## 2016-12-01 ENCOUNTER — Ambulatory Visit: Payer: Self-pay | Admitting: Licensed Clinical Social Worker

## 2016-12-13 ENCOUNTER — Telehealth: Payer: Self-pay

## 2016-12-13 NOTE — Telephone Encounter (Signed)
I spoke with Mariah Morris and her appointment was changed to Friday 12/15/2016, she verbalizes understanding.

## 2016-12-13 NOTE — Telephone Encounter (Signed)
Patient called stating that she has an appointment mon (12/18/16) with Jacalyn Lefevre. She just wanted to let Riley Lam know that she will be out of medication when she comes for appointment Monday, but she has enough until then. She also wanted to let Riley Lam know that she has been using the shot and that it hasnt been working like she thought it would.

## 2016-12-15 ENCOUNTER — Telehealth: Payer: Self-pay | Admitting: Registered Nurse

## 2016-12-15 ENCOUNTER — Other Ambulatory Visit: Payer: Self-pay

## 2016-12-15 ENCOUNTER — Encounter: Payer: Self-pay | Admitting: Registered Nurse

## 2016-12-15 ENCOUNTER — Encounter: Payer: Medicare Other | Attending: Physical Medicine & Rehabilitation | Admitting: Registered Nurse

## 2016-12-15 VITALS — BP 126/84 | HR 74 | Resp 14

## 2016-12-15 DIAGNOSIS — Z79899 Other long term (current) drug therapy: Secondary | ICD-10-CM | POA: Diagnosis not present

## 2016-12-15 DIAGNOSIS — G35 Multiple sclerosis: Secondary | ICD-10-CM | POA: Diagnosis present

## 2016-12-15 DIAGNOSIS — M5416 Radiculopathy, lumbar region: Secondary | ICD-10-CM

## 2016-12-15 DIAGNOSIS — Z5181 Encounter for therapeutic drug level monitoring: Secondary | ICD-10-CM | POA: Diagnosis not present

## 2016-12-15 DIAGNOSIS — M792 Neuralgia and neuritis, unspecified: Secondary | ICD-10-CM | POA: Diagnosis present

## 2016-12-15 DIAGNOSIS — M545 Low back pain: Secondary | ICD-10-CM

## 2016-12-15 DIAGNOSIS — G4709 Other insomnia: Secondary | ICD-10-CM

## 2016-12-15 DIAGNOSIS — G894 Chronic pain syndrome: Secondary | ICD-10-CM | POA: Diagnosis not present

## 2016-12-15 DIAGNOSIS — M62838 Other muscle spasm: Secondary | ICD-10-CM

## 2016-12-15 DIAGNOSIS — M4316 Spondylolisthesis, lumbar region: Secondary | ICD-10-CM | POA: Diagnosis not present

## 2016-12-15 DIAGNOSIS — M79604 Pain in right leg: Secondary | ICD-10-CM

## 2016-12-15 MED ORDER — TIZANIDINE HCL 4 MG PO TABS: 4.0000 mg | ORAL_TABLET | Freq: Three times a day (TID) | ORAL | 4 refills | Status: DC

## 2016-12-15 MED ORDER — OXYCODONE-ACETAMINOPHEN 10-325 MG PO TABS: 1.0000 | ORAL_TABLET | Freq: Three times a day (TID) | ORAL | 0 refills | Status: DC | PRN

## 2016-12-15 MED ORDER — GABAPENTIN 600 MG PO TABS: 600.0000 mg | ORAL_TABLET | Freq: Three times a day (TID) | ORAL | 4 refills | Status: DC

## 2016-12-15 MED ORDER — FENTANYL 25 MCG/HR TD PT72: 25.0000 ug | MEDICATED_PATCH | TRANSDERMAL | 0 refills | Status: DC

## 2016-12-15 NOTE — Progress Notes (Signed)
Subjective:    Patient ID: Mariah Morris, female    DOB: [birth year], 46 y.o.   MRN: [id number]  HPI: [name] is a 46year old female who returns for follow up appointmentfor chronic pain and medication refill. She states her pain is located in her mid-lower back radiating into her bilaterallower extremities, also reports her pain has not been controlled. Last month Dr. Riley Kill prescribed Fentanyl 12 mcg, upon reviewing note we will increase her Fentanyl , we will evaluate next month. If Mariah Morris pain is under control we will continue to monitor with a goal to decrease Oxycodone to Q12 hour per Dr. Riley Kill note, she verbalizes understanding. Mariah Morris was given instruction on Duragesic Patch placement information reviewed and she verbalizes understanding. She rates her pain 7. Her usual exercise regime is walking.  Mariah Morris Morphine equivalent is 73.80 MME.She is also prescribed Alprazolam by Dr. Percell Belt. She is being closely monitored and under the care of her psychiatrist Dr. Percell Belt .We have discussed the black box warning of using opioids and benzodiazepines.I highlighted the dangers of using these drugs together and discussed the adverse events including respiratory suppression, overdose, cognitive impairment and importance of  compliance with current regimen. She verbalizes understanding, we will continue to monitor and adjust as indicated.      Oral Swab  was performed on 09/22/2016 it was consistent.   Pain Inventory Average Pain 7 Pain Right Now 7 My pain is constant, sharp, burning, tingling and aching  In the last 24 hours, has pain interfered with the following? General activity 6 Relation with others 6 Enjoyment of life 6 What TIME of day is your pain at its worst? evening, night Sleep (in general) Fair  Pain is worse with: walking, bending, sitting, standing and some activites Pain improves with: rest, heat/ice, therapy/exercise and  medication Relief from Meds: 8  Mobility walk without assistance walk with assistance Do you have any goals in this area?  no  Function Do you have any goals in this area?  no  Neuro/Psych numbness tingling trouble walking spasms confusion anxiety  Prior Studies Any changes since last visit?  no  Physicians involved in your care Any changes since last visit?  no   Family History  Problem Relation Age of Onset  . Hypertension Mother   . Diabetes Father   . Hypertension Father    Social History   Socioeconomic History  . Marital status: Single    Spouse name: None  . Number of children: None  . Years of education: None  . Highest education level: None  Social Needs  . Financial resource strain: None  . Food insecurity - worry: None  . Food insecurity - inability: None  . Transportation needs - medical: None  . Transportation needs - non-medical: None  Occupational History  . None  Tobacco Use  . Smoking status: Never Smoker  . Smokeless tobacco: Never Used  Substance and Sexual Activity  . Alcohol use: No  . Drug use: None  . Sexual activity: None  Other Topics Concern  . None  Social History Narrative  . None   Past Surgical History:  Procedure Laterality Date  . CESAREAN SECTION     x2  . OVARIAN CYST REMOVAL     Past Medical History:  Diagnosis Date  . Chronic back pain   . GAD (generalized anxiety disorder)   . GERD (gastroesophageal reflux disease)   . Gestational diabetes   . Hypertension   .  Neuromuscular disorder (HCC) 2011   MS  . Presence of permanent cardiac pacemaker   . Seizures (HCC)    BP (!) 144/94 (BP Location: Right Wrist, Patient Position: Sitting, Cuff Size: Normal)   Pulse 77   Resp 14   SpO2 97%   Opioid Risk Score:   Fall Risk Score:  `1  Depression screen PHQ 2/9  [facility] 2/9 05/22/2016 03/27/2016 07/19/2015 02/15/2015 10/12/2014 08/21/2014 04/28/2014  Decreased Interest 0 1 0 1 1 1 1   Down,  Depressed, Hopeless 1 1 0 1 1 1 1   PHQ - 2 Score 1 2 0 2 2 2 2   Altered sleeping - - - - - - 2  Tired, decreased energy - - - - - - 3  Change in appetite - - - - - - 3  Feeling bad or failure about yourself  - - - - - - 0  Trouble concentrating - - - - - - 3  Moving slowly or fidgety/restless - - - - - - 0  Suicidal thoughts - - - - - - 0  PHQ-9 Score - - - - - - 13     Review of Systems  Constitutional: Negative.   HENT: Negative.   Eyes: Negative.   Respiratory: Negative.   Cardiovascular: Negative.   Gastrointestinal: Negative.   Endocrine: Negative.   Genitourinary: Negative.   Musculoskeletal: Positive for arthralgias, back pain, gait problem, neck pain and neck stiffness.       Spasms   Skin: Negative.   Allergic/Immunologic: Negative.   Neurological: Positive for numbness.       Tingling  Hematological: Negative.   Psychiatric/Behavioral: Positive for confusion. The patient is nervous/anxious.   All other systems reviewed and are negative.      Objective:   Physical Exam  Constitutional: She is oriented to person, place, and time. She appears well-developed and well-nourished.  HENT:  Head: Normocephalic and atraumatic.  Neck: Normal range of motion. Neck supple.  Cardiovascular: Normal rate and regular rhythm.  Pulmonary/Chest: Effort normal and breath sounds normal.  Musculoskeletal:  Normal Muscle Bulk and Muscle Testing Reveals: Upper Extremities: Full ROM and Muscle Strength 4/5 Thoracic Paraspinal Tenderness: T-7-T-9 Lumbar Paraspinal Tenderness: L-3-L-5 Lower Extremities: Full ROM and Muscle Strength 5/5 Arises from Table Slowly Antalgic Gait  Neurological: She is alert and oriented to person, place, and time.  Skin: Skin is warm and dry.  Psychiatric: She has a normal mood and affect.  Nursing note and vitals reviewed.         Assessment & Plan:  1.Multiple Sclerosis: Neurology Following: Dr. Sherryll BurgerShah. 12/15/2016 2. Low back pain/ Lumbar  Radiculitis/ Neuropathic Pain: Continue Current exercise Regime. Continue using heat therapy. Continue Gabapentin and Pamelor Refilled: Increased Fentanyl 25 MCG one patch every three days #10 and OxyCODONE 10/325 mg one tablet every 8 hours as need for pain # 90. 12/15/2016 We will continue the opioid monitoring program, this consists of regular clinic visits, examinations, urine drug screen, pill counts as well [place] Cont[place]. 3. Muscle Spasms: Continue Tizanidine. 12/15/2016 4. Depression/anxiety:Psychiatry Following Dr. Percell BeltMillet  Continue Monthly Counseling. Continue Current Medication Regime. Xanax . 12/15/2016 5. Morbid obesity:Encouraged to continue with weight Loss and Healthy Living Regimen. 12/15/2016 6. Insomnia: Continue Ambien.12/15/2016  20 minutes of face to face patient care time was spent during this visit. All questions were encouraged and answered.  F/U in 1 month

## 2016-12-15 NOTE — Telephone Encounter (Signed)
On 12/15/2016 the NCCSR was reviewed no conflict was seen on the [facility] Controlled Substance Reporting System with multiple prescribers. Mariah Morris  has a signed narcotic contract with our office. If there were any discrepancies this would have been reported to her physician.

## 2016-12-18 ENCOUNTER — Ambulatory Visit: Payer: Self-pay | Admitting: Registered Nurse

## 2017-01-10 ENCOUNTER — Other Ambulatory Visit: Payer: Self-pay | Admitting: Neurology

## 2017-01-10 DIAGNOSIS — G35 Multiple sclerosis: Secondary | ICD-10-CM

## 2017-01-10 DIAGNOSIS — R29898 Other symptoms and signs involving the musculoskeletal system: Secondary | ICD-10-CM

## 2017-01-11 ENCOUNTER — Encounter: Payer: Medicare Other | Attending: Physical Medicine & Rehabilitation | Admitting: Registered Nurse

## 2017-01-11 ENCOUNTER — Encounter: Payer: Self-pay | Admitting: Registered Nurse

## 2017-01-11 VITALS — BP 112/74 | HR 85

## 2017-01-11 DIAGNOSIS — M545 Low back pain: Secondary | ICD-10-CM | POA: Insufficient documentation

## 2017-01-11 DIAGNOSIS — G894 Chronic pain syndrome: Secondary | ICD-10-CM | POA: Diagnosis not present

## 2017-01-11 DIAGNOSIS — M62838 Other muscle spasm: Secondary | ICD-10-CM | POA: Diagnosis not present

## 2017-01-11 DIAGNOSIS — G4709 Other insomnia: Secondary | ICD-10-CM | POA: Diagnosis not present

## 2017-01-11 DIAGNOSIS — M4316 Spondylolisthesis, lumbar region: Secondary | ICD-10-CM | POA: Diagnosis not present

## 2017-01-11 DIAGNOSIS — Z79899 Other long term (current) drug therapy: Secondary | ICD-10-CM

## 2017-01-11 DIAGNOSIS — M792 Neuralgia and neuritis, unspecified: Secondary | ICD-10-CM | POA: Insufficient documentation

## 2017-01-11 DIAGNOSIS — G35 Multiple sclerosis: Secondary | ICD-10-CM | POA: Diagnosis present

## 2017-01-11 DIAGNOSIS — Z5181 Encounter for therapeutic drug level monitoring: Secondary | ICD-10-CM | POA: Diagnosis not present

## 2017-01-11 DIAGNOSIS — M5416 Radiculopathy, lumbar region: Secondary | ICD-10-CM

## 2017-01-11 MED ORDER — OXYCODONE-ACETAMINOPHEN 10-325 MG PO TABS: 1.0000 | ORAL_TABLET | Freq: Four times a day (QID) | ORAL | 0 refills | Status: DC | PRN

## 2017-01-11 NOTE — Progress Notes (Signed)
Subjective:    Patient ID: Mariah Morris, female    DOB: 12/29/1970, 46 y.o.   MRN: [id number]  HPI: [name] is a 46year old female who returns for follow up appointmentfor chronic pain and medication refill. She states her pain is located in her lower back radiating into her bilaterallower extremities. Also reports when her  Fentanyl dosage was increased to  , she began experiencing palpitations and chest pain, [name] never called the office during this time. Her Fentanyl was discontinued and discarded via office policy and Oxycodone dose will be resume. [name] didn't have a fentanyl patch on she states she thinks it fell off in her bed, she was instructed to go home and look for the patch and discard appropriately, she has twin girls home, she verbalizes understanding. [name] was also instructed to bring in her Drug Formulary next month, will speak with Dr. Riley Kill regarding prescribing Mariah Morris , she verbalizes understanding. She rates her pain 7. Her usual exercise regime is walking and performing stretching exercises.   [name] Morphine equivalent was 125.00  MME, with discontinuation of Fentanyl and resuming Oxycodone her MME will be 60.00.  She is also prescribed Alprazolam by Dr. Percell Belt. She is being closely monitored and under the care of her psychiatrist Dr. Percell Belt .We have reviewed the black box warning of using opioids and benzodiazepines.I highlighted the dangers of using these drugs together and discussed the adverse events including respiratory suppression, overdose, cognitive impairment and importance of  compliance with current regimen. She verbalizes understanding, we will continue to monitor and adjust as indicated.      Oral Swab  was performed on 09/22/2016 it was consistent.   Pain Inventory Average Pain 7 Pain Right Now 7 My pain is constant, sharp, burning, tingling and aching  In the last 24 hours, has pain interfered with the  following? General activity 6 Relation with others 6 Enjoyment of life 6 What TIME of day is your pain at its worst? evening, night Sleep (in general) Fair  Pain is worse with: walking, bending, sitting, standing and some activites Pain improves with: rest, heat/ice, therapy/exercise and medication Relief from Meds: 8  Mobility walk without assistance walk with assistance Do you have any goals in this area?  no  Function Do you have any goals in this area?  no  Neuro/Psych numbness tingling trouble walking spasms confusion anxiety  Prior Studies Any changes since last visit?  no  Physicians involved in your care Any changes since last visit?  no   Family History  Problem Relation Age of Onset  . Hypertension Mother   . Diabetes Father   . Hypertension Father    Social History   Socioeconomic History  . Marital status: Single    Spouse name: Not on file  . Number of children: Not on file  . Years of education: Not on file  . Highest education level: Not on file  Social Needs  . Financial resource strain: Not on file  . Food insecurity - worry: Not on file  . Food insecurity - inability: Not on file  . Transportation needs - medical: Not on file  . Transportation needs - non-medical: Not on file  Occupational History  . Not on file  Tobacco Use  . Smoking status: Never Smoker  . Smokeless tobacco: Never Used  Substance and Sexual Activity  . Alcohol use: No  . Drug use: Not on file  . Sexual activity: Not  on file  Other Topics Concern  . Not on file  Social History Narrative  . Not on file   Past Surgical History:  Procedure Laterality Date  . CESAREAN SECTION     x2  . OVARIAN CYST REMOVAL     Past Medical History:  Diagnosis Date  . Chronic back pain   . GAD (generalized anxiety disorder)   . GERD (gastroesophageal reflux disease)   . Gestational diabetes   . Hypertension   . Neuromuscular disorder (HCC) 2011   MS  . Presence of  permanent cardiac pacemaker   . Seizures (HCC)    There were no vitals taken for this visit.  Opioid Risk Score:   Fall Risk Score:  `1  Depression screen PHQ 2/9  Depression screen [facility] 2/9 05/22/2016 03/27/2016 07/19/2015 02/15/2015 10/12/2014 08/21/2014 04/28/2014  Decreased Interest 0 1 0 1 1 1 1   Down, Depressed, Hopeless 1 1 0 1 1 1 1   PHQ - 2 Score 1 2 0 2 2 2 2   Altered sleeping - - - - - - 2  Tired, decreased energy - - - - - - 3  Change in appetite - - - - - - 3  Feeling bad or failure about yourself  - - - - - - 0  Trouble concentrating - - - - - - 3  Moving slowly or fidgety/restless - - - - - - 0  Suicidal thoughts - - - - - - 0  PHQ-9 Score - - - - - - 13     Review of Systems  Constitutional: Negative.   HENT: Negative.   Eyes: Negative.   Respiratory: Negative.   Cardiovascular: Negative.   Gastrointestinal: Negative.   Endocrine: Negative.   Genitourinary: Negative.   Musculoskeletal: Positive for arthralgias, back pain, gait problem, neck pain and neck stiffness.       Spasms   Skin: Negative.   Allergic/Immunologic: Negative.   Neurological:       Tingling  Hematological: Negative.   Psychiatric/Behavioral: Positive for confusion. The patient is nervous/anxious.   All other systems reviewed and are negative.      Objective:   Physical Exam  Constitutional: She is oriented to person, place, and time. She appears well-developed and well-nourished.  HENT:  Head: Normocephalic and atraumatic.  Neck: Normal range of motion. Neck supple.  Cardiovascular: Normal rate and regular rhythm.  Pulmonary/Chest: Effort normal and breath sounds normal.  Musculoskeletal:  Normal Muscle Bulk and Muscle Testing Reveals: Upper Extremities: Full ROM and Muscle Strength 4/5 Thoracic Paraspinal Tenderness: T-7-T-9 Lumbar Paraspinal Tenderness: L-3-L-5 Lower Extremities: Full ROM and Muscle Strength 5/5 Arises from Table Slowly Antalgic Gait  Neurological: She is  alert and oriented to person, place, and time.  Skin: Skin is warm and dry.  Psychiatric: She has a normal mood and affect.  Nursing note and vitals reviewed.         Assessment & Plan:  1.Multiple Sclerosis: Neurology Following: Dr. Sherryll Burger. 01/11/2017 2. Low back pain/ Lumbar Radiculitis/ Neuropathic Pain: Continue Current exercise Regime. Continue using heat therapy. Continue Gabapentin and Pamelor Refilled: OxyCODONE 10/325 mg one tablet every 6 hours as need for pain # 120. Fentanyl discontinued. 01/11/2017 We will continue the opioid monitoring program, this consists of regular clinic visits, examinations, urine drug screen, pill counts as well use of [place] Controlled Substance reporting System. 3. Muscle Spasms: Continue Tizanidine. 01/11/2017 4. Depression/anxiety:Psychiatry Following Dr. Percell Belt  Continue Monthly Counseling. Continue Current Medication Regime.  Xanax . 01/11/2017 5. Morbid obesity:Encouraged to continue with weight Loss and Healthy Living Regimen. 01/11/2017 6. Insomnia: Continue Ambien.01/11/2017  20 minutes of face to face patient care time was spent during this visit. All questions were encouraged and answered.  F/U in 1 month

## 2017-01-18 ENCOUNTER — Ambulatory Visit: Payer: Medicare Other

## 2017-01-18 ENCOUNTER — Ambulatory Visit: Admission: RE | Admit: 2017-01-18 | Payer: Medicare Other | Source: Ambulatory Visit

## 2017-02-07 ENCOUNTER — Other Ambulatory Visit: Payer: Self-pay

## 2017-02-07 ENCOUNTER — Encounter: Payer: Self-pay | Admitting: Registered Nurse

## 2017-02-07 ENCOUNTER — Encounter: Payer: Medicare Other | Attending: Physical Medicine & Rehabilitation | Admitting: Registered Nurse

## 2017-02-07 VITALS — BP 114/77 | HR 73

## 2017-02-07 DIAGNOSIS — Z5181 Encounter for therapeutic drug level monitoring: Secondary | ICD-10-CM

## 2017-02-07 DIAGNOSIS — Z79899 Other long term (current) drug therapy: Secondary | ICD-10-CM | POA: Diagnosis not present

## 2017-02-07 DIAGNOSIS — M4316 Spondylolisthesis, lumbar region: Secondary | ICD-10-CM | POA: Diagnosis not present

## 2017-02-07 DIAGNOSIS — M792 Neuralgia and neuritis, unspecified: Secondary | ICD-10-CM | POA: Insufficient documentation

## 2017-02-07 DIAGNOSIS — G35 Multiple sclerosis: Secondary | ICD-10-CM | POA: Diagnosis present

## 2017-02-07 DIAGNOSIS — G4709 Other insomnia: Secondary | ICD-10-CM

## 2017-02-07 DIAGNOSIS — G894 Chronic pain syndrome: Secondary | ICD-10-CM

## 2017-02-07 DIAGNOSIS — M545 Low back pain: Secondary | ICD-10-CM | POA: Diagnosis present

## 2017-02-07 DIAGNOSIS — M5416 Radiculopathy, lumbar region: Secondary | ICD-10-CM | POA: Diagnosis not present

## 2017-02-07 MED ORDER — OXYCODONE-ACETAMINOPHEN 10-325 MG PO TABS: 1.0000 | ORAL_TABLET | Freq: Four times a day (QID) | ORAL | 0 refills | Status: DC | PRN

## 2017-02-07 NOTE — Progress Notes (Signed)
Subjective:    Patient ID: Mariah Morris, female    DOB: 04/28/1970, 47 y.o.   MRN: [id number]  HPI: [name] is a 47year old female who returns for follow up appointmentfor chronic pain and medication refill. She states her pain is located in her lower back radiating into her bilaterallower extremities.  She rates her pain 7. Her current exercise regime is walking and performing stretching exercises.  [name] pain being controlled with Oxycodone at this time. We will continue with Oxycodone if her pain intensifies we will prescribe long acting opoid and decreased the tablets in the Immediate release. We discuss different treatment options, also reports she had a reaction to Morphine, we will address the medication changes when she returns with Formulary, she verbalizes understanding.   [name] brought in The Drug Formulary it was from 2018, she will request the 2019 Formulary she states.    [name] Morphine equivalent was  77.00 MME.  She is also prescribed Alprazolam by Dr. Percell Belt. She is being closely monitored and under the care of her psychiatrist Dr. Percell Belt .We have reviewed the black box warning again regarding using opioids and benzodiazepines.I highlighted the dangers of using these drugs together and discussed the adverse events including respiratory suppression, overdose, cognitive impairment and importance of  compliance with current regimen. She verbalizes understanding, we will continue to monitor and adjust as indicated.      Oral Swab  was performed on 09/22/2016 it was consistent. Oral Swab Performed Today.  Pain Inventory Average Pain 6 Pain Right Now 6 My pain is constant, burning, stabbing, tingling and aching  In the last 24 hours, has pain interfered with the following? General activity 6 Relation with others 6 Enjoyment of life 6 What TIME of day is your pain at its worst? evening and night, night Sleep (in general) Fair  Pain is  worse with: bending and standing Pain improves with: rest, heat/ice, therapy/exercise and medication Relief from Meds: 8  Mobility walk without assistance Do you have any goals in this area?  no  Function Do you have any goals in this area?  no  Neuro/Psych weakness numbness trouble walking spasms confusion depression anxiety  Prior Studies Any changes since last visit?  no  Physicians involved in your care Any changes since last visit?  no   Family History  Problem Relation Age of Onset  . Hypertension Mother   . Diabetes Father   . Hypertension Father    Social History   Socioeconomic History  . Marital status: Single    Spouse name: None  . Number of children: None  . Years of education: None  . Highest education level: None  Social Needs  . Financial resource strain: None  . Food insecurity - worry: None  . Food insecurity - inability: None  . Transportation needs - medical: None  . Transportation needs - non-medical: None  Occupational History  . None  Tobacco Use  . Smoking status: Never Smoker  . Smokeless tobacco: Never Used  Substance and Sexual Activity  . Alcohol use: No  . Drug use: None  . Sexual activity: None  Other Topics Concern  . None  Social History Narrative  . None   Past Surgical History:  Procedure Laterality Date  . CESAREAN SECTION     x2  . OVARIAN CYST REMOVAL     Past Medical History:  Diagnosis Date  . Chronic back pain   . GAD (generalized anxiety  disorder)   . GERD (gastroesophageal reflux disease)   . Gestational diabetes   . Hypertension   . Neuromuscular disorder (HCC) 2011   MS  . Presence of permanent cardiac pacemaker   . Seizures (HCC)    BP 114/77   Pulse 73   SpO2 97%   Opioid Risk Score:  1 Fall Risk Score:  `1  Depression screen PHQ 2/9  Depression screen [facility] 2/9 02/07/2017 05/22/2016 03/27/2016 07/19/2015 02/15/2015 10/12/2014 08/21/2014  Decreased Interest 1 0 1 0 1 1 1   Down, Depressed,  Hopeless 1 1 1  0 1 1 1   PHQ - 2 Score 2 1 2  0 2 2 2   Altered sleeping - - - - - - -  Tired, decreased energy - - - - - - -  Change in appetite - - - - - - -  Feeling bad or failure about yourself  - - - - - - -  Trouble concentrating - - - - - - -  Moving slowly or fidgety/restless - - - - - - -  Suicidal thoughts - - - - - - -  PHQ-9 Score - - - - - - -     Review of Systems  Constitutional: Negative.   HENT: Negative.   Eyes: Negative.   Respiratory: Negative.   Cardiovascular: Negative.   Gastrointestinal: Negative.   Endocrine: Negative.   Genitourinary: Negative.   Musculoskeletal: Positive for arthralgias, back pain, gait problem, neck pain and neck stiffness.       Spasms   Skin: Negative.   Allergic/Immunologic: Negative.   Neurological:       Tingling  Hematological: Negative.   Psychiatric/Behavioral: Positive for confusion. The patient is nervous/anxious.   All other systems reviewed and are negative.      Objective:   Physical Exam  Constitutional: She is oriented to person, place, and time. She appears well-developed and well-nourished.  HENT:  Head: Normocephalic and atraumatic.  Neck: Normal range of motion. Neck supple.  Cardiovascular: Normal rate and regular rhythm.  Pulmonary/Chest: Effort normal and breath sounds normal.  Musculoskeletal:  Normal Muscle Bulk and Muscle Testing Reveals: Upper Extremities: Full ROM and Muscle Strength 4/5 Lumbar Paraspinal Tenderness: L-3-L-5 Lower Extremities Right: Decreased ROM and Muscle Strength 4/5 . Right Lower Extremity Flexion Produces Pain into Extremity Left: Full ROM and Muscle Strength 5/5, Left lower Extremity Flexion Produces Pain into Extremity Arises from Table Slowly Antalgic Gait  Neurological: She is alert and oriented to person, place, and time.  Skin: Skin is warm and dry.  Psychiatric: She has a normal mood and affect.  Nursing note and vitals reviewed.         Assessment & Plan:    1.Multiple Sclerosis: Neurology Following: Dr. Sherryll Burger. 02/07/2017 2. Low back pain/ Lumbar Radiculitis/ Neuropathic Pain: Continue Current exercise Regime. Continue using heat therapy. Continue Gabapentin and Pamelor Refilled: OxyCODONE 10/325 mg one tablet every 6 hours as need for pain  #120. 02/07/2017. We will continue the opioid monitoring program, this consists of regular clinic visits, examinations, urine drug screen, pill counts as well use of [place] Controlled Substance reporting System. 3. Muscle Spasms: Continue Tizanidine. 02/07/2017 4. Depression/anxiety:Psychiatry Following Dr. Percell Belt  Continue Monthly Counseling. Continue Current Medication Regime. Xanax . 02/07/2017 5. Morbid obesity:Encouraged to continue with weight Loss and Healthy Living Regimen. 02/07/2017 6. Insomnia: Continue Ambien by Dr. Carman Ching Last prescription Filled on 10/15/2016 .02/07/2017  20 minutes of face to face patient care time was spent  during this visit. All questions were encouraged and answered.  F/U in 1 month

## 2017-02-12 LAB — DRUG TOX MONITOR 1 W/CONF, ORAL FLD
Alprazolam: 10.12 ng/mL — ABNORMAL HIGH (ref <0.50)
Amphetamines: NEGATIVE ng/mL (ref <10)
BUPRENORPHINE: NEGATIVE ng/mL (ref <0.10)
Barbiturates: NEGATIVE ng/mL (ref <10)
Benzodiazepines: POSITIVE ng/mL — AB (ref <0.50)
CLONAZEPAM: NEGATIVE ng/mL (ref <0.50)
COCAINE: NEGATIVE ng/mL (ref <5.0)
CODEINE: NEGATIVE ng/mL (ref <2.5)
Chlordiazepoxide: NEGATIVE ng/mL (ref <0.50)
DIHYDROCODEINE: NEGATIVE ng/mL (ref <2.5)
Diazepam: NEGATIVE ng/mL (ref <0.50)
FENTANYL: NEGATIVE ng/mL (ref <0.10)
FLURAZEPAM: NEGATIVE ng/mL (ref <0.50)
Flunitrazepam: NEGATIVE ng/mL (ref <0.50)
HYDROCODONE: NEGATIVE ng/mL (ref <2.5)
Heroin Metabolite: NEGATIVE ng/mL (ref <1.0)
Hydromorphone: NEGATIVE ng/mL (ref <2.5)
Lorazepam: NEGATIVE ng/mL (ref <0.50)
MARIJUANA: NEGATIVE ng/mL (ref <2.5)
MDMA: NEGATIVE ng/mL (ref <10)
METHADONE: NEGATIVE ng/mL (ref <5.0)
MORPHINE: NEGATIVE ng/mL (ref <2.5)
Meprobamate: NEGATIVE ng/mL (ref <2.5)
Midazolam: NEGATIVE ng/mL (ref <0.50)
NICOTINE METABOLITE: NEGATIVE ng/mL (ref <5.0)
Nordiazepam: NEGATIVE ng/mL (ref <0.50)
Norhydrocodone: NEGATIVE ng/mL (ref <2.5)
Noroxycodone: NEGATIVE ng/mL (ref <2.5)
OPIATES: POSITIVE ng/mL — AB (ref <2.5)
OXAZEPAM: NEGATIVE ng/mL (ref <0.50)
OXYCODONE: 9.4 ng/mL — AB (ref <2.5)
OXYMORPHONE: NEGATIVE ng/mL (ref <2.5)
PHENCYCLIDINE: NEGATIVE ng/mL (ref <10)
TAPENTADOL: NEGATIVE ng/mL (ref <5.0)
TEMAZEPAM: NEGATIVE ng/mL (ref <0.50)
TRAMADOL: NEGATIVE ng/mL (ref <5.0)
TRIAZOLAM: NEGATIVE ng/mL (ref <0.50)
ZOLPIDEM: NEGATIVE ng/mL (ref <5.0)

## 2017-02-12 LAB — DRUG TOX ALC METAB W/CON, ORAL FLD: ALCOHOL METABOLITE: NEGATIVE ng/mL (ref <25)

## 2017-02-13 ENCOUNTER — Telehealth: Payer: Self-pay | Admitting: *Deleted

## 2017-02-13 NOTE — Telephone Encounter (Signed)
Oral swab drug screen was consistent for prescribed medications.  ?

## 2017-03-09 ENCOUNTER — Encounter: Payer: Medicare Other | Attending: Physical Medicine & Rehabilitation | Admitting: Registered Nurse

## 2017-03-09 ENCOUNTER — Other Ambulatory Visit: Payer: Self-pay

## 2017-03-09 ENCOUNTER — Encounter: Payer: Self-pay | Admitting: Registered Nurse

## 2017-03-09 VITALS — BP 116/79 | HR 91

## 2017-03-09 DIAGNOSIS — Z5181 Encounter for therapeutic drug level monitoring: Secondary | ICD-10-CM

## 2017-03-09 DIAGNOSIS — M7061 Trochanteric bursitis, right hip: Secondary | ICD-10-CM | POA: Diagnosis not present

## 2017-03-09 DIAGNOSIS — M4316 Spondylolisthesis, lumbar region: Secondary | ICD-10-CM

## 2017-03-09 DIAGNOSIS — G894 Chronic pain syndrome: Secondary | ICD-10-CM

## 2017-03-09 DIAGNOSIS — M7062 Trochanteric bursitis, left hip: Secondary | ICD-10-CM

## 2017-03-09 DIAGNOSIS — M545 Low back pain: Secondary | ICD-10-CM | POA: Insufficient documentation

## 2017-03-09 DIAGNOSIS — G4709 Other insomnia: Secondary | ICD-10-CM

## 2017-03-09 DIAGNOSIS — M792 Neuralgia and neuritis, unspecified: Secondary | ICD-10-CM | POA: Insufficient documentation

## 2017-03-09 DIAGNOSIS — Z79899 Other long term (current) drug therapy: Secondary | ICD-10-CM | POA: Diagnosis not present

## 2017-03-09 DIAGNOSIS — G35 Multiple sclerosis: Secondary | ICD-10-CM

## 2017-03-09 DIAGNOSIS — R5381 Other malaise: Secondary | ICD-10-CM

## 2017-03-09 DIAGNOSIS — M5416 Radiculopathy, lumbar region: Secondary | ICD-10-CM | POA: Diagnosis not present

## 2017-03-09 DIAGNOSIS — M62838 Other muscle spasm: Secondary | ICD-10-CM

## 2017-03-09 MED ORDER — OXYCODONE-ACETAMINOPHEN 10-325 MG PO TABS: 1.0000 | ORAL_TABLET | Freq: Four times a day (QID) | ORAL | 0 refills | Status: DC | PRN

## 2017-03-09 NOTE — Progress Notes (Signed)
Subjective:    Patient ID: Mariah Morris, female    DOB: 1970-12-10, 47 y.o.   MRN: [id number]  HPI: Mariah Morris is a 47year old female who returns for follow up appointmentfor chronic pain and medication refill. She states her pain is located in her lower back radiating into her bilateral lower extremities. She rates her pain 6. Her current exercise regime is walking and performing stretching exercises.  Mariah Morris pain  controlled with her current dose of Oxycodone. We will continue with the Oxycodone if her pain intensifies we will prescribe long acting opoid and decreased the tablets in her  Oxycodone immediate release. We discuss different treatment options, also reports she had a reaction to Morphine, we will address the medication changes when she returns with Formulary, she verbalizes understanding.   [name] Morphine equivalent was  66.00 MME.  She is also prescribed Alprazolam by Dr. Percell Belt. She is being closely monitored and under the care of her psychiatrist Dr. Percell Belt .We have reviewed the black box warning again regarding using opioids and benzodiazepines.I highlighted the dangers of using these drugs together and discussed the adverse events including respiratory suppression, overdose, cognitive impairment and importance of  compliance with current regimen. She verbalizes understanding, we will continue to monitor and adjust as indicated.     Oral Swab  was performed on 02/08/2016 it was consistent.  Pain Inventory Average Pain 6 Pain Right Now 6 My pain is constant, burning, stabbing, tingling and aching  In the last 24 hours, has pain interfered with the following? General activity 6 Relation with others 6 Enjoyment of life 6 What TIME of day is your pain at its worst? evening and night, night Sleep (in general) Fair  Pain is worse with: bending and standing Pain improves with: rest, heat/ice, therapy/exercise and medication Relief from Meds:  7  Mobility walk without assistance Do you have any goals in this area?  no  Function Do you have any goals in this area?  no  Neuro/Psych weakness numbness trouble walking spasms confusion depression anxiety  Prior Studies Any changes since last visit?  no  Physicians involved in your care Any changes since last visit?  no   Family History  Problem Relation Age of Onset  . Hypertension Mother   . Diabetes Father   . Hypertension Father    Social History   Socioeconomic History  . Marital status: Single    Spouse name: None  . Number of children: None  . Years of education: None  . Highest education level: None  Social Needs  . Financial resource strain: None  . Food insecurity - worry: None  . Food insecurity - inability: None  . Transportation needs - medical: None  . Transportation needs - non-medical: None  Occupational History  . None  Tobacco Use  . Smoking status: Never Smoker  . Smokeless tobacco: Never Used  Substance and Sexual Activity  . Alcohol use: No  . Drug use: None  . Sexual activity: None  Other Topics Concern  . None  Social History Narrative  . None   Past Surgical History:  Procedure Laterality Date  . CESAREAN SECTION     x2  . OVARIAN CYST REMOVAL     Past Medical History:  Diagnosis Date  . Chronic back pain   . GAD (generalized anxiety disorder)   . GERD (gastroesophageal reflux disease)   . Gestational diabetes   . Hypertension   . Neuromuscular disorder (HCC)  2011   MS  . Presence of permanent cardiac pacemaker   . Seizures (HCC)    BP 116/79   Pulse 91   SpO2 98%   Opioid Risk Score:  1 Fall Risk Score:  `1  Depression screen PHQ 2/9  Depression screen [facility] 2/9 03/09/2017 02/07/2017 05/22/2016 03/27/2016 07/19/2015 02/15/2015 10/12/2014  Decreased Interest 1 1 0 1 0 1 1  Down, Depressed, Hopeless 1 1 1 1  0 1 1  PHQ - 2 Score 2 2 1 2  0 2 2  Altered sleeping - - - - - - -  Tired, decreased energy - - - - - - -   Change in appetite - - - - - - -  Feeling bad or failure about yourself  - - - - - - -  Trouble concentrating - - - - - - -  Moving slowly or fidgety/restless - - - - - - -  Suicidal thoughts - - - - - - -  PHQ-9 Score - - - - - - -     Review of Systems  Constitutional: Negative.   HENT: Negative.   Eyes: Negative.   Respiratory: Negative.   Cardiovascular: Negative.   Gastrointestinal: Negative.   Endocrine: Negative.   Genitourinary: Negative.   Musculoskeletal: Positive for arthralgias, back pain, gait problem, neck pain and neck stiffness.       Spasms   Skin: Negative.   Allergic/Immunologic: Negative.   Neurological:       Tingling  Hematological: Negative.   Psychiatric/Behavioral: Positive for confusion and dysphoric mood. The patient is nervous/anxious.   All other systems reviewed and are negative.      Objective:   Physical Exam  Constitutional: She is oriented to person, place, and time. She appears well-developed and well-nourished.  HENT:  Head: Normocephalic and atraumatic.  Neck: Normal range of motion. Neck supple.  Cardiovascular: Normal rate and regular rhythm.  Pulmonary/Chest: Effort normal and breath sounds normal.  Musculoskeletal:  Normal Muscle Bulk and Muscle Testing Reveals: Upper Extremities: Full  ROM and Muscle Strength 5/5 Lumbar Paraspinal Tenderness: L-3-L-5 Bilateral Greater Trochanter Tenderness Lower Extremities: Decreased ROM and Muscle Strength 4/5 .  Bilateral  Lower Extremities Flexion Produces Pain into Lumbar, Bilateral Hips and Bilateral Lower Extremities Arises from Table Slowly Antalgic Gait  Neurological: She is alert and oriented to person, place, and time.  Skin: Skin is warm and dry.  Psychiatric: She has a normal mood and affect.  Nursing note and vitals reviewed.         Assessment & Plan:  1.Multiple Sclerosis: Neurology Following: Dr. Sherryll Burger. 03/09/2017 2. Low back pain/ Lumbar Radiculitis/ Neuropathic  Pain: Continue Current exercise Regime. Continue using heat therapy. Continue Gabapentin and Pamelor Refilled: OxyCODONE 10/325 mg one tablet every 6 hours as need for pain  #120. 03/09/2017. We will continue the opioid monitoring program, this consists of regular clinic visits, examinations, urine drug screen, pill counts as well use of [place] Controlled Substance reporting System. 3. Muscle Spasms: Continue Tizanidine. 03/09/2017 4. Depression/anxiety:Psychiatry Following Dr. Percell Belt  Continue Monthly Counseling. Continue Current Medication Regime. Xanax . 03/09/2017 5. Morbid obesity:Encouraged to continue with weight Loss and Healthy Living Regimen. 03/09/2017 6. Insomnia: Continue Ambien by Dr. Carman Ching Last prescription Filled on 10/05/2016 .03/09/2017 7. Bilateral Greater trochanter Bursitis: Continue Alternating Ice and Heat Therapy 8. Physical Deconditioning: RX: Physical Therapy  20 minutes of face to face patient care time was spent during this visit. All questions were encouraged  and answered.  F/U in 1 month

## 2017-04-04 ENCOUNTER — Ambulatory Visit: Payer: Medicare Other | Admitting: Physical Therapy

## 2017-04-06 ENCOUNTER — Encounter: Payer: Self-pay | Admitting: Registered Nurse

## 2017-04-06 ENCOUNTER — Encounter: Payer: Medicare Other | Attending: Physical Medicine & Rehabilitation | Admitting: Registered Nurse

## 2017-04-06 VITALS — BP 129/81 | HR 77 | Resp 14

## 2017-04-06 DIAGNOSIS — G35 Multiple sclerosis: Secondary | ICD-10-CM | POA: Insufficient documentation

## 2017-04-06 DIAGNOSIS — M62838 Other muscle spasm: Secondary | ICD-10-CM

## 2017-04-06 DIAGNOSIS — M7061 Trochanteric bursitis, right hip: Secondary | ICD-10-CM | POA: Diagnosis not present

## 2017-04-06 DIAGNOSIS — Z79899 Other long term (current) drug therapy: Secondary | ICD-10-CM

## 2017-04-06 DIAGNOSIS — M792 Neuralgia and neuritis, unspecified: Secondary | ICD-10-CM | POA: Diagnosis present

## 2017-04-06 DIAGNOSIS — M4316 Spondylolisthesis, lumbar region: Secondary | ICD-10-CM

## 2017-04-06 DIAGNOSIS — M5416 Radiculopathy, lumbar region: Secondary | ICD-10-CM | POA: Diagnosis not present

## 2017-04-06 DIAGNOSIS — G35D Multiple sclerosis, unspecified: Secondary | ICD-10-CM

## 2017-04-06 DIAGNOSIS — G4709 Other insomnia: Secondary | ICD-10-CM | POA: Diagnosis not present

## 2017-04-06 DIAGNOSIS — M545 Low back pain: Secondary | ICD-10-CM | POA: Diagnosis present

## 2017-04-06 DIAGNOSIS — G894 Chronic pain syndrome: Secondary | ICD-10-CM | POA: Diagnosis not present

## 2017-04-06 DIAGNOSIS — M7062 Trochanteric bursitis, left hip: Secondary | ICD-10-CM | POA: Diagnosis not present

## 2017-04-06 DIAGNOSIS — Z5181 Encounter for therapeutic drug level monitoring: Secondary | ICD-10-CM | POA: Diagnosis not present

## 2017-04-06 MED ORDER — OXYCODONE HCL 10 MG PO TABS: 10.0000 mg | ORAL_TABLET | Freq: Four times a day (QID) | ORAL | 0 refills | Status: DC | PRN

## 2017-04-06 MED ORDER — OXYCODONE-ACETAMINOPHEN 10-325 MG PO TABS: 1.0000 | ORAL_TABLET | Freq: Four times a day (QID) | ORAL | 0 refills | Status: DC | PRN

## 2017-04-06 NOTE — Progress Notes (Signed)
Subjective:    Patient ID: Mariah Morris, female    DOB: 24-Mar-1970, 47 y.o.   MRN: [id number]  HPI: [name] is a 47year old female who returns for follow up appointmentfor chronic pain and medication refill. She states her pain is located in her neck radiating into her bilateral shoulders, lower back radiating into her bilateral lower extremities, also reports bilateral hip pain. She rates her pain 8. Her current exercise regime is walking and performing stretching exercises. Also states she will began physical therapy next week.   [name] pain  controlled with her current dose of Oxycodone. We will continue with the Oxycodone if her pain intensifies we will prescribe long acting opoid and decreased the tablets in her Oxycodone immediate release. We will continue to monitor and review treatment options, also reports she had a reaction to Morphine, we will continue to monitor she verbalizes understanding.   [name] Morphine equivalent was  64.00 MME.  She is also prescribed Alprazolam by Dr. Percell Morris. She is being closely monitored and under the care of her psychiatrist Dr. Percell Morris .We have reviewed  the black box warning again regarding using opioids and benzodiazepines.I highlighted the dangers of using these drugs together and discussed the adverse events including respiratory suppression, overdose, cognitive impairment and importance of  compliance with current regimen. She verbalizes understanding, we will continue to monitor and adjust as indicated.     Oral Swab  was performed on 02/08/2016 it was consistent.  Pain Inventory Average Pain 8 Pain Right Now 8 My pain is constant, burning, stabbing, tingling and aching  In the last 24 hours, has pain interfered with the following? General activity 6 Relation with others 6 Enjoyment of life 6 What TIME of day is your pain at its worst? evening and night Sleep (in general) Fair  Pain is worse with: walking,  bending, sitting and standing Pain improves with: rest, heat/ice, therapy/exercise and medication Relief from Meds: 7  Mobility walk without assistance Do you have any goals in this area?  no  Function Do you have any goals in this area?  no  Neuro/Psych trouble walking spasms confusion anxiety  Prior Studies Any changes since last visit?  no  Physicians involved in your care Any changes since last visit?  no   Family History  Problem Relation Age of Onset  . Hypertension Mother   . Diabetes Father   . Hypertension Father    Social History   Socioeconomic History  . Marital status: Single    Spouse name: None  . Number of children: None  . Years of education: None  . Highest education level: None  Social Needs  . Financial resource strain: None  . Food insecurity - worry: None  . Food insecurity - inability: None  . Transportation needs - medical: None  . Transportation needs - non-medical: None  Occupational History  . None  Tobacco Use  . Smoking status: Never Smoker  . Smokeless tobacco: Never Used  Substance and Sexual Activity  . Alcohol use: No  . Drug use: None  . Sexual activity: None  Other Topics Concern  . None  Social History Narrative  . None   Past Surgical History:  Procedure Laterality Date  . CESAREAN SECTION     x2  . OVARIAN CYST REMOVAL     Past Medical History:  Diagnosis Date  . Chronic back pain   . GAD (generalized anxiety disorder)   . GERD (gastroesophageal  reflux disease)   . Gestational diabetes   . Hypertension   . Neuromuscular disorder (HCC) 2011   MS  . Presence of permanent cardiac pacemaker   . Seizures (HCC)    BP 129/81 (BP Location: Right Wrist, Patient Position: Sitting, Cuff Size: Normal)   Pulse 77   Resp 14   SpO2 98%   Opioid Risk Score:  1 Fall Risk Score:  `1  Depression screen PHQ 2/9  Depression screen [facility] 2/9 03/09/2017 02/07/2017 05/22/2016 03/27/2016 07/19/2015 02/15/2015 10/12/2014    Decreased Interest 1 1 0 1 0 1 1  Down, Depressed, Hopeless 1 1 1 1  0 1 1  PHQ - 2 Score 2 2 1 2  0 2 2  Altered sleeping - - - - - - -  Tired, decreased energy - - - - - - -  Change in appetite - - - - - - -  Feeling bad or failure about yourself  - - - - - - -  Trouble concentrating - - - - - - -  Moving slowly or fidgety/restless - - - - - - -  Suicidal thoughts - - - - - - -  PHQ-9 Score - - - - - - -     Review of Systems  Constitutional: Negative.   HENT: Negative.   Eyes: Negative.   Respiratory: Negative.   Cardiovascular: Negative.   Gastrointestinal: Negative.   Endocrine: Negative.   Genitourinary: Negative.   Musculoskeletal: Positive for arthralgias, back pain, gait problem, neck pain and neck stiffness.       Spasms   Skin: Negative.   Allergic/Immunologic: Negative.   Hematological: Negative.   Psychiatric/Behavioral: Positive for confusion. The patient is nervous/anxious.   All other systems reviewed and are negative.      Objective:   Physical Exam  Constitutional: She is oriented to person, place, and time. She appears well-developed and well-nourished.  HENT:  Head: Normocephalic and atraumatic.  Neck: Normal range of motion. Neck supple.  Cervical Paraspinal Tenderness: C-5-C-6  Cardiovascular: Normal rate and regular rhythm.  Pulmonary/Chest: Effort normal and breath sounds normal.  Musculoskeletal:  Normal Muscle Bulk and Muscle Testing Reveals: Upper Extremities: Full ROM and Muscle Strength 5/5 Bilateral AC Joint Tenderness Lumbar Hypersensitivity Bilateral Greater Trochanter Tenderness Lower Extremities: Decreased ROM and Muscle Strength 4/5 .  Bilateral  Lower Extremities Flexion Produces Pain into Lumbar and Bilateral Hips Arises from Table Slowly Antalgic Gait  Neurological: She is alert and oriented to person, place, and time.  Skin: Skin is warm and dry.  Psychiatric: She has a normal mood and affect.  Nursing note and vitals  reviewed.         Assessment & Plan:  1.Multiple Sclerosis: Neurology Following: Dr. Sherryll Morris. 04/06/2017 2. Low back pain/ Lumbar Radiculitis/ Neuropathic Pain: Continue Current exercise Regime. Continue using heat therapy. Continue Gabapentin and Pamelor Refilled: OxyCODONE 10/325 mg one tablet every 6 hours as need for pain  #120. 04/06/2017. We will continue the opioid monitoring program, this consists of regular clinic visits, examinations, urine drug screen, pill counts as well use of [place] Controlled Substance reporting System. 3. Muscle Spasms: Continue Tizanidine. 04/06/2017 4. Depression/anxiety:Psychiatry Following Dr. Percell Morris  Continue Monthly Counseling. Continue Current Medication Regime. Xanax . 04/06/2017 5. Morbid obesity:Encouraged to continue with weight Loss and Healthy Living Regimen. 04/06/2017 6. Insomnia: Continue Ambien by Dr. Carman Ching Last prescription Filled on 10/05/2016 .04/06/2017 7. Bilateral Greater trochanter Bursitis: Continue Alternating Ice and Heat Therapy 8. Physical  Deconditioning: : Physical Therapy Evaluation scheduled next week. 04/06/2017  20 minutes of face to face patient care time was spent during this visit. All questions were encouraged and answered.  F/U in 1 month

## 2017-04-09 ENCOUNTER — Ambulatory Visit: Payer: Medicare Other | Admitting: Physical Therapy

## 2017-04-11 ENCOUNTER — Ambulatory Visit: Payer: Medicare Other | Admitting: Physical Therapy

## 2017-04-16 ENCOUNTER — Ambulatory Visit: Payer: Medicare Other | Admitting: Physical Therapy

## 2017-04-18 ENCOUNTER — Ambulatory Visit: Payer: Medicare Other | Admitting: Physical Therapy

## 2017-04-24 ENCOUNTER — Ambulatory Visit: Payer: Medicare Other | Admitting: Physical Therapy

## 2017-04-25 ENCOUNTER — Ambulatory Visit: Payer: Medicare Other | Admitting: Physical Therapy

## 2017-04-26 ENCOUNTER — Ambulatory Visit: Payer: Medicare Other | Admitting: Physical Therapy

## 2017-04-30 ENCOUNTER — Ambulatory Visit: Payer: Medicare Other | Admitting: Physical Therapy

## 2017-05-02 ENCOUNTER — Ambulatory Visit: Payer: Medicare Other | Admitting: Physical Therapy

## 2017-05-07 ENCOUNTER — Ambulatory Visit: Payer: Medicare Other | Admitting: Physical Therapy

## 2017-05-08 ENCOUNTER — Other Ambulatory Visit: Payer: Self-pay

## 2017-05-08 ENCOUNTER — Encounter: Payer: Medicare Other | Attending: Physical Medicine & Rehabilitation | Admitting: Physical Medicine & Rehabilitation

## 2017-05-08 ENCOUNTER — Encounter: Payer: Self-pay | Admitting: Physical Medicine & Rehabilitation

## 2017-05-08 VITALS — BP 117/87 | HR 89 | Ht 62.0 in | Wt 261.0 lb

## 2017-05-08 DIAGNOSIS — M545 Low back pain: Secondary | ICD-10-CM | POA: Diagnosis present

## 2017-05-08 DIAGNOSIS — G35 Multiple sclerosis: Secondary | ICD-10-CM | POA: Insufficient documentation

## 2017-05-08 DIAGNOSIS — M4316 Spondylolisthesis, lumbar region: Secondary | ICD-10-CM

## 2017-05-08 DIAGNOSIS — M792 Neuralgia and neuritis, unspecified: Secondary | ICD-10-CM | POA: Diagnosis present

## 2017-05-08 MED ORDER — OXYCODONE-ACETAMINOPHEN 10-325 MG PO TABS: 1.0000 | ORAL_TABLET | Freq: Four times a day (QID) | ORAL | 0 refills | Status: DC | PRN

## 2017-05-08 NOTE — Patient Instructions (Signed)
PLEASE FEEL FREE TO CALL OUR OFFICE WITH ANY PROBLEMS OR QUESTIONS ([phone])      

## 2017-05-08 NOTE — Progress Notes (Signed)
Subjective:    Patient ID: Mariah Morris, female    DOB: 1970-04-17, 47 y.o.   MRN: [id number]  HPI   Mrs Macmaster is here in follow up of her chronic pain. She has continued to work on her weight and exercise. She has maintained her weight loss but hasn't lost much more recently. She walks each day, usually two-three x per day---up to 3 miles. She stretches before and during her walks.   She remains on oxycodone for pain control. The fentanyl patch caused palpitations. She is worried however that the oxycodone is causing itching. She had switched off of the percocet before so that we could dose the tylenol separately.    She also uses salon paws and ice/heat for local relief.   Pain Inventory Average Pain 7 Pain Right Now 7 My pain is constant, burning, stabbing, tingling and aching  In the last 24 hours, has pain interfered with the following? General activity 6 Relation with others 6 Enjoyment of life 6 What TIME of day is your pain at its worst? evening night Sleep (in general) Fair  Pain is worse with: walking, bending, sitting, standing and some activites Pain improves with: rest, heat/ice, therapy/exercise and medication Relief from Meds: 8  Mobility Do you have any goals in this area?  no  Function Do you have any goals in this area?  no  Neuro/Psych weakness trouble walking spasms confusion depression anxiety  Prior Studies Any changes since last visit?  no  Physicians involved in your care Any changes since last visit?  no   Family History  Problem Relation Age of Onset  . Hypertension Mother   . Diabetes Father   . Hypertension Father    Social History   Socioeconomic History  . Marital status: Single    Spouse name: Not on file  . Number of children: Not on file  . Years of education: Not on file  . Highest education level: Not on file  Occupational History  . Not on file  Social Needs  . Financial resource strain: Not on file  .  Food insecurity:    Worry: Not on file    Inability: Not on file  . Transportation needs:    Medical: Not on file    Non-medical: Not on file  Tobacco Use  . Smoking status: Never Smoker  . Smokeless tobacco: Never Used  Substance and Sexual Activity  . Alcohol use: No  . Drug use: Not on file  . Sexual activity: Not on file  Lifestyle  . Physical activity:    Days per week: Not on file    Minutes per session: Not on file  . Stress: Not on file  Relationships  . Social connections:    Talks on phone: Not on file    Gets together: Not on file    Attends religious service: Not on file    Active member of club or organization: Not on file    Attends meetings of clubs or organizations: Not on file    Relationship status: Not on file  Other Topics Concern  . Not on file  Social History Narrative  . Not on file   Past Surgical History:  Procedure Laterality Date  . CESAREAN SECTION     x2  . OVARIAN CYST REMOVAL     Past Medical History:  Diagnosis Date  . Chronic back pain   . GAD (generalized anxiety disorder)   . GERD (gastroesophageal reflux disease)   .  Gestational diabetes   . Hypertension   . Neuromuscular disorder (HCC) 2011   MS  . Presence of permanent cardiac pacemaker   . Seizures (HCC)    BP 117/87   Pulse 89   Ht 5\' 2"  (1.575 m) Comment: pt reported  Wt 261 lb (118.4 kg)   SpO2 93%   BMI 47.74 kg/m   Opioid Risk Score:   Fall Risk Score:  `1  Depression screen PHQ 2/9  Depression screen [facility] 2/9 05/08/2017 03/09/2017 02/07/2017 05/22/2016 03/27/2016 07/19/2015 02/15/2015  Decreased Interest 1 1 1  0 1 0 1  Down, Depressed, Hopeless 1 1 1 1 1  0 1  PHQ - 2 Score 2 2 2 1 2  0 2  Altered sleeping 0 - - - - - -  Tired, decreased energy 1 - - - - - -  Change in appetite - - - - - - -  Feeling bad or failure about yourself  1 - - - - - -  Trouble concentrating 0 - - - - - -  Moving slowly or fidgety/restless 0 - - - - - -  Suicidal thoughts 0 - - - - - -    PHQ-9 Score 4 - - - - - -    Review of Systems  Constitutional: Negative.   HENT: Negative.   Eyes: Negative.   Respiratory: Negative.   Cardiovascular: Negative.   Gastrointestinal: Negative.   Endocrine: Negative.   Genitourinary: Negative.   Musculoskeletal: Negative.   Skin: Negative.   Allergic/Immunologic: Negative.   Neurological: Negative.   Hematological: Negative.   Psychiatric/Behavioral: Negative.   All other systems reviewed and are negative.      Objective:   Physical Exam  General: No acute distress. obese HEENT: EOMI, oral membranes moist Cards: reg rate  Chest: normal effort Abdomen: Soft, NT, ND Skin: dry, intact Extremities: no edema  Musculoskeletal: limited in lumbar extension/fixed at about 10 degrees flexion Neurological: She is alert and oriented to person, place, and time.   Symmetric normal motor tone is noted throughout. Normal muscle bulk. Muscle testing reveals 4-5/5 muscle strength of the upper extremity, and 4-5/5 of the lower extremity.  Sensory is decreased to light touch, on the right arm/leg.  DTR 1 to 2+.  Psych: pleasant as always     Assessment: 1. MS-relaxing remitting  2. Low back pain, with documented Grade 1 spondylolisthesis of L4 on L5, With maladaptive posture  3. Right sided pain, neck, shoulder back and leg in a diffuse, non radicular pattern, most likely MS related  4. Depression/anxiety  5. Morbid obesity    Plan:  1. Maintain with dietary efforts and exercise. She is determined to continue working on her program 2. For neuropathic leg pain,continue gabapentin at 600mg  TID (2 doses in the evening --dinner and bedtime) 3. Ambien for sleep 4. Zanaflex 4mg  TID.for spasms.  5. Go back to percocet 10/325 q6 prn #120. Perhaps this will reduce itching. We will continue the opioid monitoring program, this consists of regular clinic visits, examinations, routine drug screening, pill counts as well as use of [place] Controlled Substance Reporting System. [place].    6. maintain topical treatments as she's doing   7. Follow up with me or NP in 1 month 15 minutes of face to face patient care time were spent during this visit. All questions were encouraged and answered.

## 2017-05-09 ENCOUNTER — Ambulatory Visit: Payer: Medicare Other | Admitting: Physical Therapy

## 2017-05-14 ENCOUNTER — Ambulatory Visit: Payer: Medicare Other | Admitting: Physical Therapy

## 2017-05-16 ENCOUNTER — Ambulatory Visit: Payer: Medicare Other | Admitting: Physical Therapy

## 2017-05-22 ENCOUNTER — Ambulatory Visit: Payer: Medicare Other | Admitting: Physical Therapy

## 2017-05-24 ENCOUNTER — Ambulatory Visit: Payer: Medicare Other | Admitting: Physical Therapy

## 2017-05-25 ENCOUNTER — Other Ambulatory Visit: Payer: Self-pay | Admitting: Registered Nurse

## 2017-05-28 ENCOUNTER — Other Ambulatory Visit: Payer: Self-pay | Admitting: *Deleted

## 2017-05-28 DIAGNOSIS — G35 Multiple sclerosis: Secondary | ICD-10-CM

## 2017-05-28 DIAGNOSIS — M79604 Pain in right leg: Secondary | ICD-10-CM

## 2017-05-28 DIAGNOSIS — M792 Neuralgia and neuritis, unspecified: Secondary | ICD-10-CM

## 2017-05-28 DIAGNOSIS — M545 Low back pain: Secondary | ICD-10-CM

## 2017-05-28 MED ORDER — GABAPENTIN 600 MG PO TABS: 600.0000 mg | ORAL_TABLET | Freq: Three times a day (TID) | ORAL | 1 refills | Status: DC

## 2017-05-29 ENCOUNTER — Ambulatory Visit: Payer: Medicare Other | Admitting: Physical Therapy

## 2017-05-31 ENCOUNTER — Ambulatory Visit: Payer: Medicare Other | Admitting: Physical Therapy

## 2017-06-07 ENCOUNTER — Other Ambulatory Visit: Payer: Self-pay

## 2017-06-07 ENCOUNTER — Encounter: Payer: Self-pay | Admitting: Registered Nurse

## 2017-06-07 ENCOUNTER — Encounter: Payer: Medicare Other | Attending: Physical Medicine & Rehabilitation | Admitting: Registered Nurse

## 2017-06-07 VITALS — BP 118/77 | HR 70 | Ht 62.0 in | Wt 262.8 lb

## 2017-06-07 DIAGNOSIS — G4709 Other insomnia: Secondary | ICD-10-CM | POA: Diagnosis not present

## 2017-06-07 DIAGNOSIS — Z5181 Encounter for therapeutic drug level monitoring: Secondary | ICD-10-CM

## 2017-06-07 DIAGNOSIS — M792 Neuralgia and neuritis, unspecified: Secondary | ICD-10-CM | POA: Diagnosis present

## 2017-06-07 DIAGNOSIS — G894 Chronic pain syndrome: Secondary | ICD-10-CM | POA: Diagnosis not present

## 2017-06-07 DIAGNOSIS — M4316 Spondylolisthesis, lumbar region: Secondary | ICD-10-CM | POA: Diagnosis not present

## 2017-06-07 DIAGNOSIS — Z79899 Other long term (current) drug therapy: Secondary | ICD-10-CM | POA: Diagnosis not present

## 2017-06-07 DIAGNOSIS — G35 Multiple sclerosis: Secondary | ICD-10-CM | POA: Insufficient documentation

## 2017-06-07 DIAGNOSIS — M5416 Radiculopathy, lumbar region: Secondary | ICD-10-CM | POA: Diagnosis not present

## 2017-06-07 DIAGNOSIS — M545 Low back pain: Secondary | ICD-10-CM | POA: Diagnosis present

## 2017-06-07 DIAGNOSIS — M62838 Other muscle spasm: Secondary | ICD-10-CM | POA: Diagnosis not present

## 2017-06-07 MED ORDER — OXYCODONE-ACETAMINOPHEN 10-325 MG PO TABS: 1.0000 | ORAL_TABLET | Freq: Four times a day (QID) | ORAL | 0 refills | Status: DC | PRN

## 2017-06-07 NOTE — Progress Notes (Signed)
Subjective:    Patient ID: Mariah Morris, female    DOB: 1971-01-13, 47 y.o.   MRN: [id number]  HPI: Mariah Morris is a 47 year old female who returns for follow up appointment for chronic pain and medication refill. She states her pain is located in her lower back radiating into her bilateral hips and lower extremities bilaterally. She rates her pain 8. Her current exercise regime walking and performing stretching exercises.   Ms. Seckman Morphine Equivalent is 60.00 MME. She is also prescribed Alprazolam by Dr. Percell Belt We have discussed the black box warning of using opioids and benzodiazepines. I highlighted the dangers of using these drugs together and discussed the adverse events including respiratory suppression, overdose, cognitive impairment and importance of compliance with current regimen. We will continue to monitor and adjust as indicated.  She is being closely monitored and under the care of her psychiatrist Dr. Percell Belt at [facility].   Last Oral Swab  was Performed on 02/07/2017, it was consistent. UDS ordered today.    Pain Inventory Average Pain 8 Pain Right Now 8 My pain is constant, sharp, burning, dull, stabbing, tingling and aching  In the last 24 hours, has pain interfered with the following? General activity 6 Relation with others 6 Enjoyment of life 6 What TIME of day is your pain at its worst? evening night Sleep (in general) Fair  Pain is worse with: walking, bending and some activites Pain improves with: rest, heat/ice, therapy/exercise and medication Relief from Meds: 6  Mobility Do you have any goals in this area?  no  Function Do you have any goals in this area?  no  Neuro/Psych weakness numbness trouble walking spasms anxiety  Prior Studies Any changes since last visit?  no  Physicians involved in your care Any changes since last visit?  no   Family History  Problem Relation Age of Onset  . Hypertension Mother     . Diabetes Father   . Hypertension Father    Social History   Socioeconomic History  . Marital status: Single    Spouse name: Not on file  . Number of children: Not on file  . Years of education: Not on file  . Highest education level: Not on file  Occupational History  . Not on file  Social Needs  . Financial resource strain: Not on file  . Food insecurity:    Worry: Not on file    Inability: Not on file  . Transportation needs:    Medical: Not on file    Non-medical: Not on file  Tobacco Use  . Smoking status: Never Smoker  . Smokeless tobacco: Never Used  Substance and Sexual Activity  . Alcohol use: No  . Drug use: Not on file  . Sexual activity: Not on file  Lifestyle  . Physical activity:    Days per week: Not on file    Minutes per session: Not on file  . Stress: Not on file  Relationships  . Social connections:    Talks on phone: Not on file    Gets together: Not on file    Attends religious service: Not on file    Active member of club or organization: Not on file    Attends meetings of clubs or organizations: Not on file    Relationship status: Not on file  Other Topics Concern  . Not on file  Social History Narrative  . Not on file   Past Surgical  History:  Procedure Laterality Date  . CESAREAN SECTION     x2  . OVARIAN CYST REMOVAL     Past Medical History:  Diagnosis Date  . Chronic back pain   . GAD (generalized anxiety disorder)   . GERD (gastroesophageal reflux disease)   . Gestational diabetes   . Hypertension   . Neuromuscular disorder (HCC) 2011   MS  . Presence of permanent cardiac pacemaker   . Seizures (HCC)    BP 118/77   Pulse 70   Ht 5\' 2"  (1.575 m) Comment: pt reported  Wt 262 lb 12.8 oz (119.2 kg)   SpO2 98%   BMI 48.07 kg/m   Opioid Risk Score:   Fall Risk Score:  `1  Depression screen PHQ 2/9  Depression screen [facility] 2/9 06/07/2017 05/08/2017 03/09/2017 02/07/2017 05/22/2016 03/27/2016 07/19/2015  Decreased Interest 1 1 1  1  0 1 0  Down, Depressed, Hopeless 1 1 1 1 1 1  0  PHQ - 2 Score 2 2 2 2 1 2  0  Altered sleeping - 0 - - - - -  Tired, decreased energy - 1 - - - - -  Change in appetite - - - - - - -  Feeling bad or failure about yourself  - 1 - - - - -  Trouble concentrating - 0 - - - - -  Moving slowly or fidgety/restless - 0 - - - - -  Suicidal thoughts - 0 - - - - -  PHQ-9 Score - 4 - - - - -   Review of Systems  Constitutional: Negative.   HENT: Negative.   Eyes: Negative.   Respiratory: Negative.   Cardiovascular: Negative.   Gastrointestinal: Negative.   Endocrine: Negative.   Genitourinary: Negative.   Musculoskeletal: Negative.   Skin: Negative.   Allergic/Immunologic: Negative.   Neurological: Negative.   Hematological: Negative.   Psychiatric/Behavioral: Negative.   All other systems reviewed and are negative.      Objective:   Physical Exam  Constitutional: She is oriented to person, place, and time. She appears well-developed and well-nourished.  HENT:  Head: Normocephalic and atraumatic.  Neck: Normal range of motion. Neck supple.  Cardiovascular: Normal rate and regular rhythm.  Pulmonary/Chest: Effort normal and breath sounds normal.  Musculoskeletal:  Normal Muscle Bulk and Muscle Testing Reveals: Upper Extremities: Full ROM and Muscle Strength 5/5 Bilateral AC Joint Tenderness Thoracic Paraspinal Tenderness: T-1-T-3 Lumbar Hypersensitivity Lower Extremities: Decreased ROM and Muscle Strength 4/5 Arises from Table slowly Antalgic gait  Neurological: She is alert and oriented to person, place, and time.  Skin: Skin is warm and dry.  Psychiatric: She has a normal mood and affect.  Nursing note and vitals reviewed.         Assessment & Plan:  1.Multiple Sclerosis: Neurology Following: Dr. Sherryll Burger. 06/07/2017 2. Lumbar Spondylolisthesis/Low back pain/ Lumbar Radiculitis/ Neuropathic Pain: Continue Current exercise Regime. Continue using heat therapy. Continue  current medication regimen with Gabapentin and Pamelor Refilled: OxyCODONE 10/325 mg one tablet every 6 hours as need for pain  #120. 06/07/2017. We will continue the opioid monitoring program, this consists of regular clinic visits, examinations, urine drug screen, pill counts as well use of [place] Controlled Substance reporting System. 3. Muscle Spasms: Continue current medication regimen with Tizanidine. 06/07/2017 4. Depression/anxiety:Psychiatry Following Dr. Percell Belt  Continue Monthly Counseling. Continue Current Medication Regime. Xanax . 06/07/2017 5. Morbid obesity:Encouraged to continue with weight Loss and Healthy Living Regimen. 06/07/2017 6. Insomnia: Continue Ambien  by Dr. Carman Ching Last prescription Filled on 10/05/2016 .06/07/2017 7. Bilateral Greater trochanter Bursitis: Continue Alternating Ice and Heat Therapy. 06/07/2017.  20 minutes of face to face patient care time was spent during this visit. All questions were encouraged and answered.  F/U in 1 month

## 2017-06-13 LAB — TOXASSURE SELECT,+ANTIDEPR,UR

## 2017-06-14 ENCOUNTER — Telehealth: Payer: Self-pay | Admitting: *Deleted

## 2017-06-14 NOTE — Telephone Encounter (Signed)
Urine drug screen for this encounter is consistent for prescribed medication 

## 2017-07-05 ENCOUNTER — Other Ambulatory Visit: Payer: Self-pay

## 2017-07-05 ENCOUNTER — Encounter: Payer: Medicare Other | Attending: Physical Medicine & Rehabilitation | Admitting: Registered Nurse

## 2017-07-05 ENCOUNTER — Encounter: Payer: Self-pay | Admitting: Registered Nurse

## 2017-07-05 VITALS — BP 111/77 | HR 71 | Ht 62.0 in | Wt 261.1 lb

## 2017-07-05 DIAGNOSIS — M7061 Trochanteric bursitis, right hip: Secondary | ICD-10-CM | POA: Diagnosis not present

## 2017-07-05 DIAGNOSIS — M7062 Trochanteric bursitis, left hip: Secondary | ICD-10-CM | POA: Diagnosis not present

## 2017-07-05 DIAGNOSIS — G894 Chronic pain syndrome: Secondary | ICD-10-CM

## 2017-07-05 DIAGNOSIS — G4709 Other insomnia: Secondary | ICD-10-CM

## 2017-07-05 DIAGNOSIS — M545 Low back pain: Secondary | ICD-10-CM | POA: Diagnosis present

## 2017-07-05 DIAGNOSIS — Z5181 Encounter for therapeutic drug level monitoring: Secondary | ICD-10-CM | POA: Diagnosis not present

## 2017-07-05 DIAGNOSIS — M792 Neuralgia and neuritis, unspecified: Secondary | ICD-10-CM | POA: Insufficient documentation

## 2017-07-05 DIAGNOSIS — M5416 Radiculopathy, lumbar region: Secondary | ICD-10-CM

## 2017-07-05 DIAGNOSIS — G35 Multiple sclerosis: Secondary | ICD-10-CM | POA: Insufficient documentation

## 2017-07-05 DIAGNOSIS — M62838 Other muscle spasm: Secondary | ICD-10-CM | POA: Diagnosis not present

## 2017-07-05 DIAGNOSIS — M4316 Spondylolisthesis, lumbar region: Secondary | ICD-10-CM | POA: Insufficient documentation

## 2017-07-05 DIAGNOSIS — Z79899 Other long term (current) drug therapy: Secondary | ICD-10-CM

## 2017-07-05 MED ORDER — OXYCODONE-ACETAMINOPHEN 10-325 MG PO TABS: 1.0000 | ORAL_TABLET | Freq: Four times a day (QID) | ORAL | 0 refills | Status: DC | PRN

## 2017-07-05 NOTE — Progress Notes (Signed)
Subjective:    Patient ID: Mariah Morris, female    DOB: 08-Jul-1970, 47 y.o.   MRN: [id number]  HPI: Mariah Morris is a 47 year old female who who returns for follow up appointment for chronic pain and medication refill. She states her pain is located in her lower back and bilateral hips. She rates her pain 7. Her current exercise regime is walking and performing stretching exercises.  Mariah Morris Morphine Equivalent is 60.00 MME. She is also prescribed Alprazolam by Dr. Percell Belt. We have discussed the black box warning of using opioids and benzodiazepines. I highlighted the dangers of using these drugs together and discussed the adverse events including respiratory suppression, overdose, cognitive impairment and importance of compliance with current regimen. We will continue to monitor and adjust as indicated. She is being closely monitored and under the care of her psychiatrist Dr. Percell Belt at [facility].   Last UDS was Performed on 06/07/2017, it was consistent.   Pain Inventory Average Pain 7 Pain Right Now 7 My pain is constant, burning, stabbing, tingling and aching  In the last 24 hours, has pain interfered with the following? General activity 6 Relation with others 6 Enjoyment of life 6 What TIME of day is your pain at its worst? evening night Sleep (in general) Fair  Pain is worse with: walking, bending, sitting and standing Pain improves with: rest, heat/ice and medication Relief from Meds: 8  Mobility Do you have any goals in this area?  no  Function Do you have any goals in this area?  no  Neuro/Psych weakness trouble walking spasms confusion anxiety  Prior Studies Any changes since last visit?  no  Physicians involved in your care Any changes since last visit?  no   Family History  Problem Relation Age of Onset  . Hypertension Mother   . Diabetes Father   . Hypertension Father    Social History   Socioeconomic History  .  Marital status: Single    Spouse name: Not on file  . Number of children: Not on file  . Years of education: Not on file  . Highest education level: Not on file  Occupational History  . Not on file  Social Needs  . Financial resource strain: Not on file  . Food insecurity:    Worry: Not on file    Inability: Not on file  . Transportation needs:    Medical: Not on file    Non-medical: Not on file  Tobacco Use  . Smoking status: Never Smoker  . Smokeless tobacco: Never Used  Substance and Sexual Activity  . Alcohol use: No  . Drug use: Not on file  . Sexual activity: Not on file  Lifestyle  . Physical activity:    Days per week: Not on file    Minutes per session: Not on file  . Stress: Not on file  Relationships  . Social connections:    Talks on phone: Not on file    Gets together: Not on file    Attends religious service: Not on file    Active member of club or organization: Not on file    Attends meetings of clubs or organizations: Not on file    Relationship status: Not on file  Other Topics Concern  . Not on file  Social History Narrative  . Not on file   Past Surgical History:  Procedure Laterality Date  . CESAREAN SECTION     x2  .  OVARIAN CYST REMOVAL     Past Medical History:  Diagnosis Date  . Chronic back pain   . GAD (generalized anxiety disorder)   . GERD (gastroesophageal reflux disease)   . Gestational diabetes   . Hypertension   . Neuromuscular disorder (HCC) 2011   MS  . Presence of permanent cardiac pacemaker   . Seizures (HCC)    BP 111/77   Pulse 71   Ht 5\' 2"  (1.575 m)   Wt 261 lb 1.6 oz (118.4 kg)   SpO2 94%   BMI 47.76 kg/m   Opioid Risk Score:   Fall Risk Score:  `1  Depression screen PHQ 2/9  Depression screen [facility] 2/9 07/05/2017 06/07/2017 05/08/2017 03/09/2017 02/07/2017 05/22/2016 03/27/2016  Decreased Interest 1 1 1 1 1  0 1  Down, Depressed, Hopeless 1 1 1 1 1 1 1   PHQ - 2 Score 2 2 2 2 2 1 2   Altered sleeping - - 0 - - - -    Tired, decreased energy - - 1 - - - -  Change in appetite - - - - - - -  Feeling bad or failure about yourself  - - 1 - - - -  Trouble concentrating - - 0 - - - -  Moving slowly or fidgety/restless - - 0 - - - -  Suicidal thoughts - - 0 - - - -  PHQ-9 Score - - 4 - - - -    Review of Systems  Constitutional: Negative.   HENT: Negative.   Eyes: Negative.   Respiratory: Negative.   Cardiovascular: Negative.   Gastrointestinal: Negative.   Endocrine: Negative.   Genitourinary: Negative.   Musculoskeletal: Negative.   Skin: Negative.   Allergic/Immunologic: Negative.   Neurological: Negative.   Hematological: Negative.   Psychiatric/Behavioral: Negative.   All other systems reviewed and are negative.      Objective:   Physical Exam  Constitutional: She is oriented to person, place, and time. She appears well-developed and well-nourished.  HENT:  Head: Normocephalic and atraumatic.  Neck: Normal range of motion. Neck supple.  Cardiovascular: Normal rate and regular rhythm.  Pulmonary/Chest: Effort normal and breath sounds normal.  Musculoskeletal:  Normal Muscle Bulk and Muscle Testing Reveals: Upper Extremities: Decreased ROM 90 Degrees and Muscle Strength 5/5 Bilateral AC Joint Tenderness Thoracic Hypersensitivity Lumbar Paraspinal Tenderness: L-3-L-5 Bilateral Greater Trochanter Tenderness Lower Extremities: Full ROM and Muscle Strength 5/5 Arises from chair slowly Antalgic Gait   Neurological: She is alert and oriented to person, place, and time.  Skin: Skin is warm and dry.  Psychiatric: She has a normal mood and affect.  Nursing note and vitals reviewed.         Assessment & Plan:  1.Multiple Sclerosis: Neurology Following: Dr. Sherryll Burger. 07/05/2017 2. Lumbar Spondylolisthesis/Low back pain/ Lumbar Radiculitis/ Neuropathic Pain: Continue Current exercise Regime. Continue using heat therapy. Continue current medication regimen with Gabapentin and  Pamelor Refilled: OxyCODONE 10/325 mg one tablet every 6 hours as need for pain #120. 07/05/2017. We will continue the opioid monitoring program, this consists of regular clinic visits, examinations, urine drug screen, pill counts as well use of [place] Controlled Substance reporting System. 3. Muscle Spasms: Continue current medication regimen with Tizanidine. 07/05/2017 4. Depression/anxiety:Psychiatry Following Dr. Percell Belt Continue Monthly Counseling. Continue Current Medication Regime. Xanax . 07/05/2017 5. Morbid obesity:Encouraged to continue with weight Loss and Healthy Living Regimen. 07/05/2017 6. Insomnia: Continue Ambien by Dr. Carman Ching Last prescription Filled on 10/05/2016 .07/05/2017 7. Bilateral  Greater trochanter Bursitis: Continue Alternating Ice and Heat Therapy. 07/05/2017.  20 minutes of face to face patient care time was spent during this visit. All questions were encouraged and answered.  F/U in 1 month

## 2017-08-06 ENCOUNTER — Other Ambulatory Visit: Payer: Self-pay

## 2017-08-06 ENCOUNTER — Encounter: Payer: Self-pay | Admitting: Registered Nurse

## 2017-08-06 ENCOUNTER — Encounter: Payer: Medicare Other | Attending: Physical Medicine & Rehabilitation | Admitting: Registered Nurse

## 2017-08-06 VITALS — BP 129/76 | HR 70 | Ht 62.0 in | Wt 255.0 lb

## 2017-08-06 DIAGNOSIS — M4316 Spondylolisthesis, lumbar region: Secondary | ICD-10-CM | POA: Diagnosis not present

## 2017-08-06 DIAGNOSIS — G4709 Other insomnia: Secondary | ICD-10-CM

## 2017-08-06 DIAGNOSIS — G894 Chronic pain syndrome: Secondary | ICD-10-CM

## 2017-08-06 DIAGNOSIS — M792 Neuralgia and neuritis, unspecified: Secondary | ICD-10-CM | POA: Insufficient documentation

## 2017-08-06 DIAGNOSIS — M5416 Radiculopathy, lumbar region: Secondary | ICD-10-CM | POA: Diagnosis not present

## 2017-08-06 DIAGNOSIS — M62838 Other muscle spasm: Secondary | ICD-10-CM

## 2017-08-06 DIAGNOSIS — M7061 Trochanteric bursitis, right hip: Secondary | ICD-10-CM

## 2017-08-06 DIAGNOSIS — M7062 Trochanteric bursitis, left hip: Secondary | ICD-10-CM | POA: Diagnosis not present

## 2017-08-06 DIAGNOSIS — G35 Multiple sclerosis: Secondary | ICD-10-CM | POA: Diagnosis not present

## 2017-08-06 DIAGNOSIS — Z5181 Encounter for therapeutic drug level monitoring: Secondary | ICD-10-CM | POA: Diagnosis not present

## 2017-08-06 DIAGNOSIS — M545 Low back pain: Secondary | ICD-10-CM | POA: Diagnosis present

## 2017-08-06 DIAGNOSIS — Z79899 Other long term (current) drug therapy: Secondary | ICD-10-CM

## 2017-08-06 MED ORDER — OXYCODONE-ACETAMINOPHEN 10-325 MG PO TABS: 1.0000 | ORAL_TABLET | Freq: Four times a day (QID) | ORAL | 0 refills | Status: DC | PRN

## 2017-08-06 NOTE — Progress Notes (Signed)
Subjective:    Patient ID: Mariah Morris, female    DOB: 10/18/1970, 47 y.o.   MRN: [id number]  HPI: Ms. TEQUILA ROTTMANN is a 47 year old female who returns for follow up appointment for chronic pain and medication refill. She states her pain is located in her lower back radiating into her bilateral lower extremities. She rates her pain 6. Her current exercise regime is walking.   Ms. Huie Morphine Equivalent is 60.00 MME. Last UDS was Performed on 06/07/2017, it was consistent.   Pain Inventory Average Pain 6 Pain Right Now 6 My pain is constant, sharp, burning, tingling and aching  In the last 24 hours, has pain interfered with the following? General activity 6 Relation with others 6 Enjoyment of life 6 What TIME of day is your pain at its worst? evening night Sleep (in general) Fair  Pain is worse with: walking, bending, sitting and standing Pain improves with: rest, therapy/exercise and medication Relief from Meds: 7  Mobility use a cane ability to climb steps?  no  Function Do you have any goals in this area?  no  Neuro/Psych trouble walking spasms confusion anxiety  Prior Studies Any changes since last visit?  no  Physicians involved in your care Any changes since last visit?  no   Family History  Problem Relation Age of Onset  . Hypertension Mother   . Diabetes Father   . Hypertension Father    Social History   Socioeconomic History  . Marital status: Single    Spouse name: Not on file  . Number of children: Not on file  . Years of education: Not on file  . Highest education level: Not on file  Occupational History  . Not on file  Social Needs  . Financial resource strain: Not on file  . Food insecurity:    Worry: Not on file    Inability: Not on file  . Transportation needs:    Medical: Not on file    Non-medical: Not on file  Tobacco Use  . Smoking status: Never Smoker  . Smokeless tobacco: Never Used  Substance and Sexual  Activity  . Alcohol use: No  . Drug use: Not on file  . Sexual activity: Not on file  Lifestyle  . Physical activity:    Days per week: Not on file    Minutes per session: Not on file  . Stress: Not on file  Relationships  . Social connections:    Talks on phone: Not on file    Gets together: Not on file    Attends religious service: Not on file    Active member of club or organization: Not on file    Attends meetings of clubs or organizations: Not on file    Relationship status: Not on file  Other Topics Concern  . Not on file  Social History Narrative  . Not on file   Past Surgical History:  Procedure Laterality Date  . CESAREAN SECTION     x2  . OVARIAN CYST REMOVAL     Past Medical History:  Diagnosis Date  . Chronic back pain   . GAD (generalized anxiety disorder)   . GERD (gastroesophageal reflux disease)   . Gestational diabetes   . Hypertension   . Neuromuscular disorder (HCC) 2011   MS  . Presence of permanent cardiac pacemaker   . Seizures (HCC)    BP 129/76   Pulse 70   Ht 5\' 2"  (1.575 m)  Wt 255 lb (115.7 kg)   SpO2 98%   BMI 46.64 kg/m   Opioid Risk Score:   Fall Risk Score:  `1  Depression screen PHQ 2/9  Depression screen [facility] 2/9 08/06/2017 07/05/2017 06/07/2017 05/08/2017 03/09/2017 02/07/2017 05/22/2016  Decreased Interest 1 1 1 1 1 1  0  Down, Depressed, Hopeless 1 1 1 1 1 1 1   PHQ - 2 Score 2 2 2 2 2 2 1   Altered sleeping - - - 0 - - -  Tired, decreased energy - - - 1 - - -  Change in appetite - - - - - - -  Feeling bad or failure about yourself  - - - 1 - - -  Trouble concentrating - - - 0 - - -  Moving slowly or fidgety/restless - - - 0 - - -  Suicidal thoughts - - - 0 - - -  PHQ-9 Score - - - 4 - - -    Review of Systems  Constitutional: Negative.   HENT: Negative.   Eyes: Negative.   Respiratory: Negative.   Cardiovascular: Negative.   Gastrointestinal: Negative.   Endocrine: Negative.   Genitourinary: Negative.   Musculoskeletal:  Negative.   Skin: Negative.   Allergic/Immunologic: Negative.   Neurological: Negative.   Hematological: Negative.   Psychiatric/Behavioral: Negative.   All other systems reviewed and are negative.      Objective:   Physical Exam  Constitutional: She is oriented to person, place, and time. She appears well-developed and well-nourished.  HENT:  Head: Normocephalic and atraumatic.  Neck: Normal range of motion. Neck supple.  Cardiovascular: Normal rate and regular rhythm.  Pulmonary/Chest: Effort normal and breath sounds normal.  Musculoskeletal:  Normal Muscle Bulk and Muscle Testing Reveals: Upper Extremities: Full ROM and Muscle Strength 5/5 Bilateral AC Joint Tenderness Thoracic Paraspinal Tenderness: T-1-T-3 T-7-T-9 Lumbar Hypersensitivity Lower Extremities: Full ROM and Muscle Strength 5/5 Arises from Table Slowly Antalgic gait  Neurological: She is alert and oriented to person, place, and time.  Skin: Skin is warm and dry.  Psychiatric: She has a normal mood and affect. Her behavior is normal.  Nursing note and vitals reviewed.         Assessment & Plan:  1.Multiple Sclerosis: Neurology Following: Dr. Sherryll Burger. 08/06/2017 2.Lumbar Spondylolisthesis/Low back pain/ Lumbar Radiculitis/ Neuropathic Pain: Continue Current exercise Regime. Continue using heat therapy. Continuecurrent medication regimen withGabapentin and Pamelor Refilled: OxyCODONE 10/325 mg one tablet every 6 hours as need for pain #120. 08/06/2017. We will continue the opioid monitoring program, this consists of regular clinic visits, examinations, urine drug screen, pill counts as well use of [place] Controlled Substance reporting System. 3. Muscle Spasms: Continuecurrent medication regimen withTizanidine. 08/06/2017 4. Depression/anxiety:Psychiatry Following Dr. Percell Belt Continue Monthly Counseling. Continue Current Medication Regime. Xanax . 08/06/2017 5. Morbid obesity:Encouraged to  continue with weight Loss and Healthy Living Regimen. 08/06/2017 6. Insomnia: Continue Ambien by Dr. Carman Ching Last prescription Filled on 10/05/2016 .08/06/2017 7. Bilateral Greater trochanter Bursitis: No complaints Today.Continue Alternating Ice and Heat Therapy. 08/06/2017.  20 minutes of face to face patient care time was spent during this visit. All questions were encouraged and answered.  F/U in 1 month

## 2017-09-01 ENCOUNTER — Other Ambulatory Visit: Payer: Self-pay | Admitting: Physical Medicine & Rehabilitation

## 2017-09-05 ENCOUNTER — Encounter: Payer: Self-pay | Admitting: Registered Nurse

## 2017-09-05 ENCOUNTER — Encounter: Payer: Medicare Other | Attending: Physical Medicine & Rehabilitation | Admitting: Registered Nurse

## 2017-09-05 VITALS — BP 126/84 | HR 69 | Resp 14 | Ht 62.0 in | Wt 255.0 lb

## 2017-09-05 DIAGNOSIS — M7062 Trochanteric bursitis, left hip: Secondary | ICD-10-CM

## 2017-09-05 DIAGNOSIS — G894 Chronic pain syndrome: Secondary | ICD-10-CM

## 2017-09-05 DIAGNOSIS — M62838 Other muscle spasm: Secondary | ICD-10-CM

## 2017-09-05 DIAGNOSIS — M545 Low back pain: Secondary | ICD-10-CM | POA: Diagnosis present

## 2017-09-05 DIAGNOSIS — M7061 Trochanteric bursitis, right hip: Secondary | ICD-10-CM

## 2017-09-05 DIAGNOSIS — M4316 Spondylolisthesis, lumbar region: Secondary | ICD-10-CM | POA: Diagnosis not present

## 2017-09-05 DIAGNOSIS — Z79899 Other long term (current) drug therapy: Secondary | ICD-10-CM

## 2017-09-05 DIAGNOSIS — M5416 Radiculopathy, lumbar region: Secondary | ICD-10-CM | POA: Diagnosis not present

## 2017-09-05 DIAGNOSIS — G35 Multiple sclerosis: Secondary | ICD-10-CM

## 2017-09-05 DIAGNOSIS — M792 Neuralgia and neuritis, unspecified: Secondary | ICD-10-CM | POA: Diagnosis present

## 2017-09-05 DIAGNOSIS — Z5181 Encounter for therapeutic drug level monitoring: Secondary | ICD-10-CM

## 2017-09-05 MED ORDER — OXYCODONE-ACETAMINOPHEN 10-325 MG PO TABS: 1.0000 | ORAL_TABLET | Freq: Four times a day (QID) | ORAL | 0 refills | Status: DC | PRN

## 2017-09-05 NOTE — Progress Notes (Signed)
Subjective:    Patient ID: Mariah Morris, female    DOB: 11/05/1970, 47 y.o.   MRN: [id number]  HPI: Mariah Morris is a 47 year old female who returns for follow up appointment for chronic pain and medication refill. She states her pain is located in her lower back radiating into her bilateral lower extremities and bilateral hip pain. She rates her pain 6. Her current exercise regime is walking and performing stretching exercises.   Mariah Morris Morphine equivalent is 60.00 MME. She is also prescribed Alprazolam by Dr. Percell Belt .We have discussed the black box warning of using opioids and benzodiazepines. I highlighted the dangers of using these drugs together and discussed the adverse events including respiratory suppression, overdose, cognitive impairment and importance of compliance with current regimen. We will continue to monitor and adjust as indicated.  She is being closely monitored and under the care of her psychiatrist Dr. Percell Belt.  Mariah Morris expresses the desire to be weaned off the Oxycodone, she has try to decreased her medication to three times a day as needed, she was encouraged to continue with slow weaning of oxycodone. She verbalizes understanding.   Last UDS was Performed  On 06/07/17, it was consistent.   Pain Inventory Average Pain 6 Pain Right Now 6 My pain is constant, burning, stabbing, tingling and aching  In the last 24 hours, has pain interfered with the following? General activity 6 Relation with others 6 Enjoyment of life 6 What TIME of day is your pain at its worst? morning Sleep (in general) Fair  Pain is worse with: walking, bending and sitting Pain improves with: rest, heat/ice, therapy/exercise and medication Relief from Meds: 8  Mobility walk without assistance Do you have any goals in this area?  no  Function Do you have any goals in this area?  no  Neuro/Psych weakness trouble walking spasms  Prior Studies Any changes since  last visit?  no  Physicians involved in your care Any changes since last visit?  no   Family History  Problem Relation Age of Onset  . Hypertension Mother   . Diabetes Father   . Hypertension Father    Social History   Socioeconomic History  . Marital status: Single    Spouse name: Not on file  . Number of children: Not on file  . Years of education: Not on file  . Highest education level: Not on file  Occupational History  . Not on file  Social Needs  . Financial resource strain: Not on file  . Food insecurity:    Worry: Not on file    Inability: Not on file  . Transportation needs:    Medical: Not on file    Non-medical: Not on file  Tobacco Use  . Smoking status: Never Smoker  . Smokeless tobacco: Never Used  Substance and Sexual Activity  . Alcohol use: No  . Drug use: Not on file  . Sexual activity: Not on file  Lifestyle  . Physical activity:    Days per week: Not on file    Minutes per session: Not on file  . Stress: Not on file  Relationships  . Social connections:    Talks on phone: Not on file    Gets together: Not on file    Attends religious service: Not on file    Active member of club or organization: Not on file    Attends meetings of clubs or organizations: Not on file  Relationship status: Not on file  Other Topics Concern  . Not on file  Social History Narrative  . Not on file   Past Surgical History:  Procedure Laterality Date  . CESAREAN SECTION     x2  . OVARIAN CYST REMOVAL     Past Medical History:  Diagnosis Date  . Chronic back pain   . GAD (generalized anxiety disorder)   . GERD (gastroesophageal reflux disease)   . Gestational diabetes   . Hypertension   . Neuromuscular disorder (HCC) 2011   MS  . Presence of permanent cardiac pacemaker   . Seizures (HCC)    BP 126/84 (BP Location: Right Arm, Patient Position: Sitting, Cuff Size: Large)   Pulse 69   Resp 14   Ht 5\' 2"  (1.575 m)   Wt 255 lb (115.7 kg)   SpO2 95%    BMI 46.64 kg/m   Opioid Risk Score:   Fall Risk Score:  `1  Depression screen PHQ 2/9  Depression screen [facility] 2/9 08/06/2017 07/05/2017 06/07/2017 05/08/2017 03/09/2017 02/07/2017 05/22/2016  Decreased Interest 1 1 1 1 1 1  0  Down, Depressed, Hopeless 1 1 1 1 1 1 1   PHQ - 2 Score 2 2 2 2 2 2 1   Altered sleeping - - - 0 - - -  Tired, decreased energy - - - 1 - - -  Change in appetite - - - - - - -  Feeling bad or failure about yourself  - - - 1 - - -  Trouble concentrating - - - 0 - - -  Moving slowly or fidgety/restless - - - 0 - - -  Suicidal thoughts - - - 0 - - -  PHQ-9 Score - - - 4 - - -    Review of Systems  Constitutional: Negative.   HENT: Negative.   Eyes: Negative.   Respiratory: Negative.   Cardiovascular: Negative.   Gastrointestinal: Negative.   Endocrine: Negative.   Genitourinary: Negative.   Musculoskeletal: Positive for arthralgias, back pain and gait problem.       Spasms   Skin: Negative.   Allergic/Immunologic: Negative.   Hematological: Negative.   All other systems reviewed and are negative.      Objective:   Physical Exam  Constitutional: She is oriented to person, place, and time. She appears well-developed and well-nourished.  HENT:  Head: Normocephalic and atraumatic.  Neck: Normal range of motion. Neck supple.  Cardiovascular: Normal rate and regular rhythm.  Pulmonary/Chest: Effort normal and breath sounds normal.  Musculoskeletal:  Normal Muscle Bulk and Muscle Testing Reveals: Upper Extremities: Full ROM and Muscle Strength 5/5 Lumbar Paraspinal Tenderness: L-3-L-5 Bilateral Greater Trochanter Tenderness Lower Extremities: Decreased ROM and Muscle Strength 4/5 Bilateral Lower Extremities Flexion Produces Pin into Bilateral Lower Extremities Arises from Table Slowly Antalgic Gait  Neurological: She is alert and oriented to person, place, and time.  Skin: Skin is warm and dry.  Psychiatric: She has a normal mood and affect. Her behavior is  normal.  Nursing note and vitals reviewed.         Assessment & Plan:  1.Multiple Sclerosis: Neurology Following: Dr. Sherryll Burger. 09/05/2017 2.Lumbar Spondylolisthesis/Low back pain/ Lumbar Radiculitis/ Neuropathic Pain: Continue Current exercise Regime. Continue using heat therapy. Continuecurrent medication regimen withGabapentin and Pamelor Refilled: OxyCODONE 10/325 mg one tablet every 6 hours as need for pain #120. 09/05/2017. Continue with slow weaning of Oxycodone.  We will continue the opioid monitoring program, this consists of regular clinic visits,  examinations, urine drug screen, pill counts as well use of [place] Controlled Substance reporting System. 3. Muscle Spasms: Continuecurrent medication regimen withTizanidine. 09/05/2017 4. Depression/anxiety:Psychiatry Following Dr. Percell Belt Continue Monthly Counseling. Continue Current Medication Regime. Xanax . 09/05/2017 5. Morbid obesity:Encouraged to continue with weight Loss and Healthy Living Regimen. 09/05/2017 6. Insomnia: No complaints. Continue to monitor.  Ambien by Dr. Carman Ching Last prescription Filled on 10/05/2016 .09/05/2017 7. Bilateral Greater trochanter Bursitis:.Continue Alternating Ice and Heat Therapy. 09/05/2017.  20 minutes of face to face patient care time was spent during this visit. All questions were encouraged and answered.  F/U in 1 month

## 2017-10-08 ENCOUNTER — Encounter: Payer: Self-pay | Admitting: Registered Nurse

## 2017-10-08 ENCOUNTER — Encounter: Payer: Medicare Other | Attending: Physical Medicine & Rehabilitation | Admitting: Registered Nurse

## 2017-10-08 VITALS — BP 108/74 | HR 75 | Ht 62.0 in | Wt 256.0 lb

## 2017-10-08 DIAGNOSIS — M545 Low back pain: Secondary | ICD-10-CM | POA: Diagnosis present

## 2017-10-08 DIAGNOSIS — M7062 Trochanteric bursitis, left hip: Secondary | ICD-10-CM

## 2017-10-08 DIAGNOSIS — M4316 Spondylolisthesis, lumbar region: Secondary | ICD-10-CM | POA: Insufficient documentation

## 2017-10-08 DIAGNOSIS — M792 Neuralgia and neuritis, unspecified: Secondary | ICD-10-CM | POA: Diagnosis present

## 2017-10-08 DIAGNOSIS — Z5181 Encounter for therapeutic drug level monitoring: Secondary | ICD-10-CM

## 2017-10-08 DIAGNOSIS — G35 Multiple sclerosis: Secondary | ICD-10-CM | POA: Insufficient documentation

## 2017-10-08 DIAGNOSIS — M5416 Radiculopathy, lumbar region: Secondary | ICD-10-CM

## 2017-10-08 DIAGNOSIS — Z79899 Other long term (current) drug therapy: Secondary | ICD-10-CM

## 2017-10-08 DIAGNOSIS — G894 Chronic pain syndrome: Secondary | ICD-10-CM

## 2017-10-08 DIAGNOSIS — M7061 Trochanteric bursitis, right hip: Secondary | ICD-10-CM

## 2017-10-08 NOTE — Progress Notes (Signed)
Subjective:    Patient ID: Mariah Morris, female    DOB: 05-Nov-1970, 47 y.o.   MRN: [id number]  HPI: [name] is a 47 year old female who returns for follow up appointment for chronic pain and medication refill. She states her pain is located in her lower back. She rates her pain 6. Her current exercise regime is walking and performing stretching exercises.   Mr. Gari Morphine Equivalent is 60.00 MME. She is also prescribed Alprazolam by Gillie Manners PA-C. We have discussed the black box warning of using opioids and benzodiazepines. I highlighted the dangers of using these drugs together and discussed the adverse events including respiratory suppression, overdose, cognitive impairment and importance of compliance with current regimen. We will continue to monitor and adjust as indicated.   She is being closely monitored and under the care of her psychiatrist. Dr. Murlean Hark.   Last UDs was Performed on 06/07/2017, it was consistent.   Pain Inventory Average Pain 6 Pain Right Now 6 My pain is constant, sharp, burning, dull, stabbing and aching  In the last 24 hours, has pain interfered with the following? General activity 6 Relation with others 6 Enjoyment of life 6 What TIME of day is your pain at its worst? night Sleep (in general) Fair  Pain is worse with: bending, sitting and standing Pain improves with: rest, heat/ice, therapy/exercise and medication Relief from Meds: 8  Mobility Do you have any goals in this area?  no  Function Do you have any goals in this area?  no  Neuro/Psych weakness trouble walking spasms confusion anxiety  Prior Studies Any changes since last visit?  no  Physicians involved in your care Any changes since last visit?  no   Family History  Problem Relation Age of Onset  . Hypertension Mother   . Diabetes Father   . Hypertension Father    Social History   Socioeconomic History  . Marital status: Single    Spouse  name: Not on file  . Number of children: Not on file  . Years of education: Not on file  . Highest education level: Not on file  Occupational History  . Not on file  Social Needs  . Financial resource strain: Not on file  . Food insecurity:    Worry: Not on file    Inability: Not on file  . Transportation needs:    Medical: Not on file    Non-medical: Not on file  Tobacco Use  . Smoking status: Never Smoker  . Smokeless tobacco: Never Used  Substance and Sexual Activity  . Alcohol use: No  . Drug use: Not on file  . Sexual activity: Not on file  Lifestyle  . Physical activity:    Days per week: Not on file    Minutes per session: Not on file  . Stress: Not on file  Relationships  . Social connections:    Talks on phone: Not on file    Gets together: Not on file    Attends religious service: Not on file    Active member of club or organization: Not on file    Attends meetings of clubs or organizations: Not on file    Relationship status: Not on file  Other Topics Concern  . Not on file  Social History Narrative  . Not on file   Past Surgical History:  Procedure Laterality Date  . CESAREAN SECTION     x2  . OVARIAN CYST REMOVAL  Past Medical History:  Diagnosis Date  . Chronic back pain   . GAD (generalized anxiety disorder)   . GERD (gastroesophageal reflux disease)   . Gestational diabetes   . Hypertension   . Neuromuscular disorder (HCC) 2011   MS  . Presence of permanent cardiac pacemaker   . Seizures (HCC)    There were no vitals taken for this visit.  Opioid Risk Score:   Fall Risk Score:  `1  Depression screen PHQ 2/9  Depression screen [facility] 2/9 08/06/2017 07/05/2017 06/07/2017 05/08/2017 03/09/2017 02/07/2017 05/22/2016  Decreased Interest 1 1 1 1 1 1  0  Down, Depressed, Hopeless 1 1 1 1 1 1 1   PHQ - 2 Score 2 2 2 2 2 2 1   Altered sleeping - - - 0 - - -  Tired, decreased energy - - - 1 - - -  Change in appetite - - - - - - -  Feeling bad or failure  about yourself  - - - 1 - - -  Trouble concentrating - - - 0 - - -  Moving slowly or fidgety/restless - - - 0 - - -  Suicidal thoughts - - - 0 - - -  PHQ-9 Score - - - 4 - - -     Review of Systems  Constitutional: Negative.   HENT: Negative.   Eyes: Negative.   Respiratory: Negative.   Cardiovascular: Negative.   Gastrointestinal: Negative.   Endocrine: Negative.   Genitourinary: Negative.   Musculoskeletal: Positive for arthralgias, back pain, gait problem and myalgias.  Skin: Negative.   Allergic/Immunologic: Negative.   Neurological: Positive for weakness.  Hematological: Negative.   Psychiatric/Behavioral: Positive for confusion. The patient is nervous/anxious.   All other systems reviewed and are negative.      Objective:   Physical Exam  Constitutional: She is oriented to person, place, and time. She appears well-developed and well-nourished.  Morbid Obesity  HENT:  Head: Normocephalic and atraumatic.  Neck: Normal range of motion. Neck supple.  Cardiovascular: Normal rate and regular rhythm.  Pulmonary/Chest: Effort normal and breath sounds normal.  Musculoskeletal:  Normal Muscle Bulk and Muscle Testing Reveals: Upper Extremities: Full ROM and Muscle Strength 5/5 Bilateral AC Joint Tenderness Lumbar Paraspinal Tenderness: L-3-L-5 Lower Extremities: Full ROM and Muscle Strength 5/5 Arises from Table slowly Antalgic Gait   Neurological: She is alert and oriented to person, place, and time.  Skin: Skin is warm and dry.  Psychiatric: She has a normal mood and affect. Her behavior is normal.  Nursing note and vitals reviewed.         Assessment & Plan:  1.Multiple Sclerosis: Neurology Following: Dr. Sherryll Burger. 10/08/2017 2.Lumbar Spondylolisthesis/Low back pain/ Lumbar Radiculitis/ Neuropathic Pain: Continue Current exercise Regime. Continue using heat therapy. Continuecurrent medication regimen withGabapentin and Pamelor Refilled: OxyCODONE 10/325 mg one  tablet every 6 hours as need for pain #120. 10/08/2017. Continue with slow weaning of Oxycodone.  We will continue the opioid monitoring program, this consists of regular clinic visits, examinations, urine drug screen, pill counts as well use of [place] Controlled Substance reporting System. 3. Muscle Spasms: Continuecurrent medication regimen withTizanidine. 10/08/2017 4. Depression/anxiety:Psychiatry Following Dr. Percell Belt Continue Monthly Counseling. Continue Current Medication Regime. Xanax . 10/08/2017 5. Morbid obesity:Encouraged to continue with weight Loss and Healthy Living Regimen. 10/08/2017 6. Insomnia: No complaints. Continue to monitor.  Ambien by Dr. Carman Ching Last prescription Filled on 10/05/2016 .10/08/2017 7. Bilateral Greater trochanter Bursitis:.Continue Alternating Ice and Heat Therapy. 09/05/2017.  20  minutes of face to face patient care time was spent during this visit. All questions were encouraged and answered.  F/U in 1 month

## 2017-10-09 ENCOUNTER — Encounter: Payer: Medicare Other | Admitting: Registered Nurse

## 2017-10-17 ENCOUNTER — Telehealth: Payer: Self-pay

## 2017-10-17 DIAGNOSIS — M4316 Spondylolisthesis, lumbar region: Secondary | ICD-10-CM

## 2017-10-17 MED ORDER — OXYCODONE-ACETAMINOPHEN 10-325 MG PO TABS
1.0000 | ORAL_TABLET | Freq: Four times a day (QID) | ORAL | 0 refills | Status: DC | PRN
Start: 2017-10-17 — End: 2017-11-07

## 2017-10-17 NOTE — Telephone Encounter (Signed)
Return Ms. Stoneham call, according to the PMP last prescription picked up on 09/05/2017. She's on a slow wean of her oxycodone averaging three times a day there were days she had to take 4 tablets due to the intensity of pain. We will continue with the slow weaning. Oxycodone prescription sent, she verbalizes understanding.

## 2017-10-17 NOTE — Telephone Encounter (Signed)
Pt called stating [facility] [place] In [place] has not received prescription for Oxycodone/APAP yet. Looks like last prescription confirmed on  09/05/17 and last filled on 09/05/17 according to PMP aware. Next appt 11/05/17.

## 2017-11-07 ENCOUNTER — Encounter: Payer: Medicare Other | Attending: Physical Medicine & Rehabilitation | Admitting: Registered Nurse

## 2017-11-07 ENCOUNTER — Encounter: Payer: Self-pay | Admitting: Registered Nurse

## 2017-11-07 VITALS — BP 115/79 | HR 63 | Ht 62.0 in | Wt 257.0 lb

## 2017-11-07 DIAGNOSIS — M62838 Other muscle spasm: Secondary | ICD-10-CM

## 2017-11-07 DIAGNOSIS — M792 Neuralgia and neuritis, unspecified: Secondary | ICD-10-CM | POA: Insufficient documentation

## 2017-11-07 DIAGNOSIS — M5416 Radiculopathy, lumbar region: Secondary | ICD-10-CM | POA: Diagnosis not present

## 2017-11-07 DIAGNOSIS — Z79899 Other long term (current) drug therapy: Secondary | ICD-10-CM

## 2017-11-07 DIAGNOSIS — M4316 Spondylolisthesis, lumbar region: Secondary | ICD-10-CM

## 2017-11-07 DIAGNOSIS — M545 Low back pain: Secondary | ICD-10-CM | POA: Diagnosis present

## 2017-11-07 DIAGNOSIS — G35 Multiple sclerosis: Secondary | ICD-10-CM | POA: Diagnosis not present

## 2017-11-07 DIAGNOSIS — Z5181 Encounter for therapeutic drug level monitoring: Secondary | ICD-10-CM

## 2017-11-07 DIAGNOSIS — G894 Chronic pain syndrome: Secondary | ICD-10-CM

## 2017-11-07 MED ORDER — OXYCODONE-ACETAMINOPHEN 10-325 MG PO TABS: 1.0000 | ORAL_TABLET | Freq: Four times a day (QID) | ORAL | 0 refills | Status: DC | PRN

## 2017-11-07 NOTE — Progress Notes (Signed)
Subjective:    Patient ID: Mariah Morris, female    DOB: December 13, 1970, 47 y.o.   MRN: [id number]  HPI: Mariah Morris is a 47 year old female who returns for follow up appointment for chronic pain and medication refill. She states her pain is located in her bilateral shoulders and lower back pain radiating into her bilateral lower extremities. She rates her pain 6. Her current exercise regime is walking and performing stretching exercises.   Mariah Morris Morphine Equivalent is 61.11. Sheis also prescribed Alprazolam by [name] PA-C. We have discussed the black box warning of using opioids and benzodiazepines. I highlighted the dangers of using these drugs together and discussed the adverse events including respiratory suppression, overdose, cognitive impairment and importance of compliance with current regimen. We will continue to monitor and adjust as indicated.   She is being closely monitored and under the care of her psychiatrist Dr. Percell Belt.  Last UDS was Performed on 06/07/2017, it was consistent.   Pain Inventory Average Pain 6 Pain Right Now 6 My pain is constant and aching  In the last 24 hours, has pain interfered with the following? General activity 6 Relation with others 6 Enjoyment of life 6 What TIME of day is your pain at its worst? evening Sleep (in general) Fair  Pain is worse with: bending and sitting Pain improves with: medication Relief from Meds: 7  Mobility use a cane ability to climb steps?  no do you drive?  yes  Function Do you have any goals in this area?  no  Neuro/Psych weakness trouble walking spasms confusion anxiety  Prior Studies Any changes since last visit?  no  Physicians involved in your care Any changes since last visit?  no   Family History  Problem Relation Age of Onset  . Hypertension Mother   . Diabetes Father   . Hypertension Father    Social History   Socioeconomic History  . Marital status: Single      Spouse name: Not on file  . Number of children: Not on file  . Years of education: Not on file  . Highest education level: Not on file  Occupational History  . Not on file  Social Needs  . Financial resource strain: Not on file  . Food insecurity:    Worry: Not on file    Inability: Not on file  . Transportation needs:    Medical: Not on file    Non-medical: Not on file  Tobacco Use  . Smoking status: Never Smoker  . Smokeless tobacco: Never Used  Substance and Sexual Activity  . Alcohol use: No  . Drug use: Not on file  . Sexual activity: Not on file  Lifestyle  . Physical activity:    Days per week: Not on file    Minutes per session: Not on file  . Stress: Not on file  Relationships  . Social connections:    Talks on phone: Not on file    Gets together: Not on file    Attends religious service: Not on file    Active member of club or organization: Not on file    Attends meetings of clubs or organizations: Not on file    Relationship status: Not on file  Other Topics Concern  . Not on file  Social History Narrative  . Not on file   Past Surgical History:  Procedure Laterality Date  . CESAREAN SECTION     x2  . OVARIAN  CYST REMOVAL     Past Medical History:  Diagnosis Date  . Chronic back pain   . GAD (generalized anxiety disorder)   . GERD (gastroesophageal reflux disease)   . Gestational diabetes   . Hypertension   . Neuromuscular disorder (HCC) 2011   MS  . Presence of permanent cardiac pacemaker   . Seizures (HCC)    There were no vitals taken for this visit.  Opioid Risk Score:   Fall Risk Score:  `1  Depression screen PHQ 2/9  Depression screen [facility] 2/9 08/06/2017 07/05/2017 06/07/2017 05/08/2017 03/09/2017 02/07/2017 05/22/2016  Decreased Interest 1 1 1 1 1 1  0  Down, Depressed, Hopeless 1 1 1 1 1 1 1   PHQ - 2 Score 2 2 2 2 2 2 1   Altered sleeping - - - 0 - - -  Tired, decreased energy - - - 1 - - -  Change in appetite - - - - - - -  Feeling bad or  failure about yourself  - - - 1 - - -  Trouble concentrating - - - 0 - - -  Moving slowly or fidgety/restless - - - 0 - - -  Suicidal thoughts - - - 0 - - -  PHQ-9 Score - - - 4 - - -     Review of Systems  Constitutional: Negative.   HENT: Negative.   Eyes: Negative.   Respiratory: Negative.   Cardiovascular: Negative.   Gastrointestinal: Negative.   Endocrine: Negative.   Genitourinary: Negative.   Musculoskeletal: Positive for arthralgias, gait problem and myalgias.  Skin: Negative.   Allergic/Immunologic: Negative.   Neurological: Positive for weakness.  Hematological: Negative.   Psychiatric/Behavioral: Positive for confusion. The patient is nervous/anxious.   All other systems reviewed and are negative.      Objective:   Physical Exam  Constitutional: She is oriented to person, place, and time. She appears well-developed and well-nourished.  HENT:  Head: Normocephalic and atraumatic.  Neck: Normal range of motion. Neck supple.  Cardiovascular: Normal rate and regular rhythm.  Pulmonary/Chest: Effort normal and breath sounds normal.  Musculoskeletal:  Normal Muscle Bulk and Muscle Testing Reveals: Upper Extremities: Full ROM and Muscle Strength 5/5 Bilateral AC Joint Tenderness Lumbar Paraspinal Tenderness: L-3-L--5 Lower Extremities: Full ROM and Muscle Strength 5/5 Arises from Table Slowly Antalgic Gait   Neurological: She is alert and oriented to person, place, and time.  Skin: Skin is warm and dry.  Psychiatric: She has a normal mood and affect. Her behavior is normal.  Nursing note and vitals reviewed.         Assessment & Plan:  1.Multiple Sclerosis: Neurology Following: Dr. Sherryll Burger. 11/07/2017 2.Lumbar Spondylolisthesis/Low back pain/ Lumbar Radiculitis/ Neuropathic Pain: Continue Current exercise Regime. Continue using heat therapy. Continuecurrent medication regimen withGabapentin and Pamelor. 11/07/2017.  Refilled: Decreased: OxyCODONE 10/325  mg one tablet every 6 hours as need for pain#115.  11/07/2017.Continue with slow weaning of Oxycodone.. We will continue the opioid monitoring program, this consists of regular clinic visits, examinations, urine drug screen, pill counts as well use of [place] Controlled Substance reporting System. 3. Muscle Spasms: Continuecurrent medication regimen withTizanidine. 11/07/2017 4. Depression/anxiety:Psychiatry Following Dr. Percell Belt Continue Monthly Counseling. Continue Current Medication Regime. Xanax . 11/07/2017 5. Morbid obesity:Encouraged to continue with weight Loss and Healthy Living Regimen. 11/07/2017 6. Insomnia:No complaints today. Continueto monitor. 11/07/2017. 7. Bilateral Greater trochanter Bursitis No complaints today. .Continue Alternating Ice and Heat Therapy. 11/07/2017.  20 minutes of face to face  patient care time was spent during this visit. All questions were encouraged and answered.  F/U in 1 month

## 2017-12-03 ENCOUNTER — Other Ambulatory Visit: Payer: Self-pay | Admitting: Physical Medicine & Rehabilitation

## 2017-12-03 DIAGNOSIS — G35 Multiple sclerosis: Secondary | ICD-10-CM

## 2017-12-03 DIAGNOSIS — M792 Neuralgia and neuritis, unspecified: Secondary | ICD-10-CM

## 2017-12-03 DIAGNOSIS — M545 Low back pain: Secondary | ICD-10-CM

## 2017-12-03 DIAGNOSIS — M79604 Pain in right leg: Secondary | ICD-10-CM

## 2017-12-05 ENCOUNTER — Encounter: Payer: Medicare Other | Attending: Physical Medicine & Rehabilitation | Admitting: Physical Medicine & Rehabilitation

## 2017-12-05 ENCOUNTER — Encounter: Payer: Self-pay | Admitting: Physical Medicine & Rehabilitation

## 2017-12-05 VITALS — BP 135/85 | HR 70 | Ht 62.0 in | Wt 256.0 lb

## 2017-12-05 DIAGNOSIS — G35 Multiple sclerosis: Secondary | ICD-10-CM | POA: Insufficient documentation

## 2017-12-05 DIAGNOSIS — M792 Neuralgia and neuritis, unspecified: Secondary | ICD-10-CM | POA: Insufficient documentation

## 2017-12-05 DIAGNOSIS — M545 Low back pain: Secondary | ICD-10-CM | POA: Diagnosis present

## 2017-12-05 DIAGNOSIS — M4316 Spondylolisthesis, lumbar region: Secondary | ICD-10-CM | POA: Insufficient documentation

## 2017-12-05 MED ORDER — OXYCODONE-ACETAMINOPHEN 10-325 MG PO TABS: 1.0000 | ORAL_TABLET | Freq: Four times a day (QID) | ORAL | 0 refills | Status: DC | PRN

## 2017-12-05 NOTE — Progress Notes (Signed)
Subjective:    Patient ID: Mariah Morris, female    DOB: 05-Apr-1970, 47 y.o.   MRN: [id number]  HPI   Mariah Morris is here in follow up of her chronic pain.  She has continued low back pain.  She does feel that it has improved to an extent but remains very limiting.  She is working on regular exercise and is on the track almost every day.  She is watching what she eats.  She finds that when she exercises her pain often is better.  She still is limited however in sitting or standing in one position for too long.  She is using Percocet 10/325 1 every 6 hours as needed.  She is also on nortriptyline and tizanidine.  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  Pain Inventory Average Pain 7 Pain Right Now 7 My pain is constant, sharp and aching  In the last 24 hours, has pain interfered with the following? General activity 6 Relation with others 6 Enjoyment of life 6 What TIME of day is your pain at its worst? evening Sleep (in general) Fair  Pain is worse with: bending, sitting and standing Pain improves with: rest, heat/ice and medication Relief from Meds: 7  Mobility use a cane  Function Do you have any goals in this area?  no  Neuro/Psych trouble walking spasms confusion depression anxiety  Prior Studies Any changes since last visit?  no  Physicians involved in your care Any changes since last visit?  no   Family History  Problem Relation Age of Onset  . Hypertension Mother   . Diabetes Father   . Hypertension Father    Social History   Socioeconomic History  . Marital status: Single    Spouse name: Not on file  . Number of children: Not on file  . Years of education: Not on file  . Highest education level: Not on file  Occupational History  . Not on file  Social Needs  . Financial resource strain: Not on file  . Food insecurity:    Worry: Not on file    Inability: Not on file  . Transportation needs:    Medical: Not on  file    Non-medical: Not on file  Tobacco Use  . Smoking status: Never Smoker  . Smokeless tobacco: Never Used  Substance and Sexual Activity  . Alcohol use: No  . Drug use: Not on file  . Sexual activity: Not on file  Lifestyle  . Physical activity:    Days per week: Not on file    Minutes per session: Not on file  . Stress: Not on file  Relationships  . Social connections:    Talks on phone: Not on file    Gets together: Not on file    Attends religious service: Not on file    Active member of club or organization: Not on file    Attends meetings of clubs or organizations: Not on file    Relationship status: Not on file  Other Topics Concern  . Not on file  Social History Narrative  . Not on file   Past Surgical History:  Procedure Laterality Date  . CESAREAN SECTION     x2  . OVARIAN CYST REMOVAL     Past Medical History:  Diagnosis Date  . Chronic back pain   . GAD (generalized anxiety disorder)   . GERD (gastroesophageal reflux disease)   . Gestational diabetes   . Hypertension   .  Neuromuscular disorder (HCC) 2011   MS  . Presence of permanent cardiac pacemaker   . Seizures (HCC)    Ht 5\' 2"  (1.575 m)   Wt 256 lb (116.1 kg)   LMP 11/22/2017   BMI 46.82 kg/m   Opioid Risk Score:   Fall Risk Score:  `1  Depression screen PHQ 2/9  Depression screen [facility] 2/9 08/06/2017 07/05/2017 06/07/2017 05/08/2017 03/09/2017 02/07/2017 05/22/2016  Decreased Interest 1 1 1 1 1 1  0  Down, Depressed, Hopeless 1 1 1 1 1 1 1   PHQ - 2 Score 2 2 2 2 2 2 1   Altered sleeping - - - 0 - - -  Tired, decreased energy - - - 1 - - -  Change in appetite - - - - - - -  Feeling bad or failure about yourself  - - - 1 - - -  Trouble concentrating - - - 0 - - -  Moving slowly or fidgety/restless - - - 0 - - -  Suicidal thoughts - - - 0 - - -  PHQ-9 Score - - - 4 - - -     Review of Systems  Constitutional: Negative.   HENT: Negative.   Eyes: Negative.   Respiratory: Negative.     Cardiovascular: Negative.   Gastrointestinal: Negative.   Endocrine: Negative.   Genitourinary: Negative.   Musculoskeletal: Positive for arthralgias, back pain, gait problem and myalgias.  Skin: Negative.   Allergic/Immunologic: Negative.   Hematological: Negative.   Psychiatric/Behavioral: Positive for confusion and dysphoric mood. The patient is nervous/anxious.   All other systems reviewed and are negative.      Objective:   Physical Exam  General: No acute distress, obese HEENT: EOMI, oral membranes moist Cards: reg rate  Chest: normal effort Abdomen: Soft, NT, ND Skin: dry, intact Extremities: no edema   Musculoskeletal: low back ttp. +facet signs. Limited with extension. Also has pain with forward flexion. SLR equivocal--causes low back pain more than anything else.  Neurological: She is alert and oriented to person, place, and time.  motor 5/5 all 4  Sensory is decreased to light touch in the right arm/leg.  DTR1 to 2+. Psych:  Pleasant   Assessment: 1. MS-relaxing remitting  2. Low back pain, with documented Grade 1 spondylolisthesis of L4 on L5 on , With maladaptive posture  3. Right sided pain, neck, shoulder back and leg in a diffuse, non radicular pattern, most likely MS related  4. Depression/anxiety  5. Morbid obesity    Plan:  1.Continue with HEP.  She is doing a great job sticking with her exercise program.  She is losing some weight but frustrated she has not been able to get off as much as she had hoped by now. 2. For neuropathic leg pain,continue gabapentin at 600mg  TID (2 doses in the evening --dinner and bedtime)  3. Ambien for sleep 4. Zanaflex 4mg  TID.for spasms.  5. Continue percocet 10/325 q6 prn #115.  We will continue the controlled substance monitoring program, this consists of regular clinic visits, examinations, routine drug screening, pill counts as well as use of [place] Controlled Substance Reporting System. [place]  [place] today.   6. Given her ongoing low back pain which is limiting to her from a functional deficits I will order an MRI of lumbar spine. . 2016 xray shows grade 1 spondylolisthesis. Exam findings consistent with facet arthropathy, +/- disc 7. Follow up with me in about month  with me or nurse  practitioner. 15 minutes of face to face patient care time were spent during this visit. All questions were encouraged and answered.

## 2017-12-05 NOTE — Patient Instructions (Signed)
   CONTINUE WITH YOUR EXERCISE PROGRAM. YOU'RE DOING A GREAT JOB!!!

## 2017-12-07 ENCOUNTER — Encounter: Payer: Medicare Other | Admitting: Registered Nurse

## 2018-01-04 ENCOUNTER — Encounter: Payer: Self-pay | Admitting: Registered Nurse

## 2018-01-04 ENCOUNTER — Encounter: Payer: Medicare Other | Attending: Physical Medicine & Rehabilitation | Admitting: Registered Nurse

## 2018-01-04 VITALS — BP 127/88 | HR 75 | Ht 62.0 in | Wt 256.0 lb

## 2018-01-04 DIAGNOSIS — Z5181 Encounter for therapeutic drug level monitoring: Secondary | ICD-10-CM

## 2018-01-04 DIAGNOSIS — Z79899 Other long term (current) drug therapy: Secondary | ICD-10-CM

## 2018-01-04 DIAGNOSIS — G35 Multiple sclerosis: Secondary | ICD-10-CM | POA: Diagnosis present

## 2018-01-04 DIAGNOSIS — G894 Chronic pain syndrome: Secondary | ICD-10-CM

## 2018-01-04 DIAGNOSIS — M792 Neuralgia and neuritis, unspecified: Secondary | ICD-10-CM | POA: Diagnosis present

## 2018-01-04 DIAGNOSIS — M62838 Other muscle spasm: Secondary | ICD-10-CM | POA: Diagnosis not present

## 2018-01-04 DIAGNOSIS — M4316 Spondylolisthesis, lumbar region: Secondary | ICD-10-CM | POA: Insufficient documentation

## 2018-01-04 DIAGNOSIS — M545 Low back pain: Secondary | ICD-10-CM | POA: Insufficient documentation

## 2018-01-04 MED ORDER — OXYCODONE-ACETAMINOPHEN 10-325 MG PO TABS: 1.0000 | ORAL_TABLET | Freq: Four times a day (QID) | ORAL | 0 refills | Status: DC | PRN

## 2018-01-04 NOTE — Progress Notes (Signed)
Subjective:    Patient ID: Mariah Morris, female    DOB: December 13, 1970, 47 y.o.   MRN: [id number]  HPI: Mariah Morris is a 47 y.o. female who returns for follow up appointment for chronic pain and medication refill. She states her pain is located in her lower back. She rates her  pain 7, she continue with the slow weaning of Percocet she's taking 3- 4 tablets a day with a goal to decreased to three times a day as needed for pain. She verbalizes understanding.  Her current exercise regime is walking and performing stretching exercises.  Mariah Morris Morphine equivalent is 61.61 MME. She is also prescribed Alprazolam by Mariah Bon Pa-C. We have discussed the black box warning of using opioids and benzodiazepines. I highlighted the dangers of using these drugs together and discussed the adverse events including respiratory suppression, overdose, cognitive impairment and importance of compliance with current regimen. We will continue to monitor and adjust as indicated.  she is being closely monitored and under the care of her psychiatrist Dr. Percell Morris.    Last UDS was Performed on 05/-09/2017, it was consistent.   Pain Inventory Average Pain 7 Pain Right Now 7 My pain is constant, burning and aching  In the last 24 hours, has pain interfered with the following? General activity 6 Relation with others 6 Enjoyment of life 6 What TIME of day is your pain at its worst? evening Sleep (in general) Good  Pain is worse with: sitting and standing Pain improves with: rest, heat/ice and medication Relief from Meds: 8  Mobility Do you have any goals in this area?  no  Function Do you have any goals in this area?  no  Neuro/Psych trouble walking spasms confusion anxiety  Prior Studies Any changes since last visit?  no  Physicians involved in your care Any changes since last visit?  no   Family History  Problem Relation Age of Onset  . Hypertension Mother   . Diabetes Father     . Hypertension Father    Social History   Socioeconomic History  . Marital status: Single    Spouse name: Not on file  . Number of children: Not on file  . Years of education: Not on file  . Highest education level: Not on file  Occupational History  . Not on file  Social Needs  . Financial resource strain: Not on file  . Food insecurity:    Worry: Not on file    Inability: Not on file  . Transportation needs:    Medical: Not on file    Non-medical: Not on file  Tobacco Use  . Smoking status: Never Smoker  . Smokeless tobacco: Never Used  Substance and Sexual Activity  . Alcohol use: No  . Drug use: Not on file  . Sexual activity: Not on file  Lifestyle  . Physical activity:    Days per week: Not on file    Minutes per session: Not on file  . Stress: Not on file  Relationships  . Social connections:    Talks on phone: Not on file    Gets together: Not on file    Attends religious service: Not on file    Active member of club or organization: Not on file    Attends meetings of clubs or organizations: Not on file    Relationship status: Not on file  Other Topics Concern  . Not on file  Social History Narrative  . Not  on file   Past Surgical History:  Procedure Laterality Date  . CESAREAN SECTION     x2  . OVARIAN CYST REMOVAL     Past Medical History:  Diagnosis Date  . Chronic back pain   . GAD (generalized anxiety disorder)   . GERD (gastroesophageal reflux disease)   . Gestational diabetes   . Hypertension   . Neuromuscular disorder (HCC) 2011   MS  . Presence of permanent cardiac pacemaker   . Seizures (HCC)    There were no vitals taken for this visit.  Opioid Risk Score:   Fall Risk Score:  `1  Depression screen PHQ 2/9  Depression screen [facility] 2/9 08/06/2017 07/05/2017 06/07/2017 05/08/2017 03/09/2017 02/07/2017 05/22/2016  Decreased Interest 1 1 1 1 1 1  0  Down, Depressed, Hopeless 1 1 1 1 1 1 1   PHQ - 2 Score 2 2 2 2 2 2 1   Altered sleeping - - - 0  - - -  Tired, decreased energy - - - 1 - - -  Change in appetite - - - - - - -  Feeling bad or failure about yourself  - - - 1 - - -  Trouble concentrating - - - 0 - - -  Moving slowly or fidgety/restless - - - 0 - - -  Suicidal thoughts - - - 0 - - -  PHQ-9 Score - - - 4 - - -     Review of Systems  Constitutional: Negative.   HENT: Negative.   Eyes: Negative.   Respiratory: Negative.   Cardiovascular: Negative.   Gastrointestinal: Negative.   Endocrine: Negative.   Genitourinary: Negative.   Musculoskeletal: Positive for arthralgias, back pain, gait problem and myalgias.  Skin: Negative.   Allergic/Immunologic: Negative.   Hematological: Negative.   Psychiatric/Behavioral: Positive for confusion. The patient is nervous/anxious.   All other systems reviewed and are negative.      Objective:   Physical Exam  Constitutional: She is oriented to person, place, and time. She appears well-developed and well-nourished.  Morbid Obesity  HENT:  Head: Normocephalic and atraumatic.  Neck: Normal range of motion. Neck supple.  Cardiovascular: Normal rate and regular rhythm.  Pulmonary/Chest: Effort normal and breath sounds normal.  Musculoskeletal:  Normal Muscle Bulk and Muscle Testing Reveals: Upper Extremities: Full ROM and Muscle Strength 5/5 Lumbar Paraspinal Tenderness: L-4-L-5 Lower Extremities: Full ROM and Muscle Strength 5/5 Arises from Table Slowly Narrow Based gait   Neurological: She is alert and oriented to person, place, and time.  Skin: Skin is warm and dry.  Psychiatric: She has a normal mood and affect. Her behavior is normal.  Nursing note and vitals reviewed.         Assessment & Plan:  1.Multiple Sclerosis: Neurology Following: Dr. Sherryll Burger. 01/04/2018 2.Lumbar Spondylolisthesis/Low back pain/ Lumbar Radiculitis/ Neuropathic Pain: Continue Current exercise Regime. Continue using heat therapy. Continuecurrent medication regimen withGabapentin and  Pamelor. 01/04/2018.  Refilled: Decreased: OxyCODONE 10/325 mg one tablet every 6 hours as need for pain#110. 01/04/2018.Continue with slow weaning of Oxycodone.. We will continue the opioid monitoring program, this consists of regular clinic visits, examinations, urine drug screen, pill counts as well use of [place] Controlled Substance reporting System. 3. Muscle Spasms: Continuecurrent medication regimen withTizanidine. 01/04/2018 4. Depression/anxiety:Psychiatry Following Dr. Percell Morris Continue Monthly Counseling. Continue Current Medication Regime. Xanax . 01/04/2018 5. Morbid obesity:Encouraged to continue with weight Loss and Healthy Living Regimen. 01/04/2018 6. Insomnia:No complaints today. Continueto monitor. 01/04/2018. 7. Bilateral Greater  trochanter Bursitis No complaints today. .Continue Alternating Ice and Heat Therapy. 01/04/2018.  20 minutes of face to face patient care time was spent during this visit. All questions were encouraged and answered.  F/U in 1 month

## 2018-02-04 ENCOUNTER — Encounter: Payer: Medicare Other | Attending: Physical Medicine & Rehabilitation | Admitting: Registered Nurse

## 2018-02-04 ENCOUNTER — Encounter: Payer: Self-pay | Admitting: Registered Nurse

## 2018-02-04 VITALS — BP 121/84 | HR 65 | Ht 62.0 in | Wt 256.0 lb

## 2018-02-04 DIAGNOSIS — G894 Chronic pain syndrome: Secondary | ICD-10-CM

## 2018-02-04 DIAGNOSIS — M4316 Spondylolisthesis, lumbar region: Secondary | ICD-10-CM

## 2018-02-04 DIAGNOSIS — G35 Multiple sclerosis: Secondary | ICD-10-CM

## 2018-02-04 DIAGNOSIS — M62838 Other muscle spasm: Secondary | ICD-10-CM | POA: Diagnosis not present

## 2018-02-04 DIAGNOSIS — M792 Neuralgia and neuritis, unspecified: Secondary | ICD-10-CM | POA: Diagnosis present

## 2018-02-04 DIAGNOSIS — M545 Low back pain: Secondary | ICD-10-CM | POA: Insufficient documentation

## 2018-02-04 DIAGNOSIS — M5416 Radiculopathy, lumbar region: Secondary | ICD-10-CM

## 2018-02-04 DIAGNOSIS — Z5181 Encounter for therapeutic drug level monitoring: Secondary | ICD-10-CM

## 2018-02-04 DIAGNOSIS — Z79899 Other long term (current) drug therapy: Secondary | ICD-10-CM

## 2018-02-04 DIAGNOSIS — G35D Multiple sclerosis, unspecified: Secondary | ICD-10-CM

## 2018-02-04 MED ORDER — OXYCODONE-ACETAMINOPHEN 10-325 MG PO TABS: 1.0000 | ORAL_TABLET | Freq: Four times a day (QID) | ORAL | 0 refills | Status: DC | PRN

## 2018-02-04 NOTE — Progress Notes (Signed)
Subjective:    Patient ID: Mariah Morris, female    DOB: Jan 13, 1971, 48 y.o.   MRN: [id number]  HPI: Mariah Morris is a 48 y.o. female who returns for follow up appointment for chronic pain and medication refill. She states her pain is located in her lower back radiating into her bilateral lower extremities. She rates her pain 6. Her current exercise regime is walking and performing stretching exercises.  Ms, [name] Morphine equivalent is 61.11 MME. She is also prescribed Alprazolam  by [name] PA-C .We have discussed the black box warning of using opioids and benzodiazepines. I highlighted the dangers of using these drugs together and discussed the adverse events including respiratory suppression, overdose, cognitive impairment and importance of compliance with current regimen. We will continue to monitor and adjust as indicated.  She  is being closely monitored and under the care of her psychiatrist at [facility].   Last UDS was Performed on 06/07/2017, it was consistent.   Pain Inventory Average Pain 6 Pain Right Now 6 My pain is constant, burning, tingling and aching  In the last 24 hours, has pain interfered with the following? General activity 6 Relation with others 6 Enjoyment of life 6 What TIME of day is your pain at its worst? evening, night Sleep (in general) Fair  Pain is worse with: bending, standing and some activites Pain improves with: rest, heat/ice and medication Relief from Meds: 7  Mobility Do you have any goals in this area?  no  Function Do you have any goals in this area?  no  Neuro/Psych trouble walking spasms anxiety  Prior Studies Any changes since last visit?  no  Physicians involved in your care Any changes since last visit?  no   Family History  Problem Relation Age of Onset  . Hypertension Mother   . Diabetes Father   . Hypertension Father    Social History   Socioeconomic History  . Marital  status: Single    Spouse name: Not on file  . Number of children: Not on file  . Years of education: Not on file  . Highest education level: Not on file  Occupational History  . Not on file  Social Needs  . Financial resource strain: Not on file  . Food insecurity:    Worry: Not on file    Inability: Not on file  . Transportation needs:    Medical: Not on file    Non-medical: Not on file  Tobacco Use  . Smoking status: Never Smoker  . Smokeless tobacco: Never Used  Substance and Sexual Activity  . Alcohol use: No  . Drug use: Not on file  . Sexual activity: Not on file  Lifestyle  . Physical activity:    Days per week: Not on file    Minutes per session: Not on file  . Stress: Not on file  Relationships  . Social connections:    Talks on phone: Not on file    Gets together: Not on file    Attends religious service: Not on file    Active member of club or organization: Not on file    Attends meetings of clubs or organizations: Not on file    Relationship status: Not on file  Other Topics Concern  . Not on file  Social History Narrative  . Not on file   Past Surgical History:  Procedure Laterality Date  . CESAREAN SECTION     x2  .  OVARIAN CYST REMOVAL     Past Medical History:  Diagnosis Date  . Chronic back pain   . GAD (generalized anxiety disorder)   . GERD (gastroesophageal reflux disease)   . Gestational diabetes   . Hypertension   . Neuromuscular disorder (HCC) 2011   MS  . Presence of permanent cardiac pacemaker   . Seizures (HCC)    BP 121/84   Pulse 65   Ht 5\' 2"  (1.575 m)   Wt 256 lb (116.1 kg)   SpO2 98%   BMI 46.82 kg/m   Opioid Risk Score:   Fall Risk Score:  `1  Depression screen PHQ 2/9  [facility] 08/06/2017 07/05/2017 06/07/2017 05/08/2017 03/09/2017 02/07/2017 05/22/2016  Decreased Interest 1 1 1 1 1 1  0  Down, Depressed, Hopeless 1 1 1 1 1 1 1   PHQ - 2 Score 2 2 2 2 2 2 1   Altered sleeping - - - 0 - - -  Tired, decreased  energy - - - 1 - - -  Change in appetite - - - - - - -  Feeling bad or failure about yourself  - - - 1 - - -  Trouble concentrating - - - 0 - - -  Moving slowly or fidgety/restless - - - 0 - - -  Suicidal thoughts - - - 0 - - -  PHQ-9 Score - - - 4 - - -    Review of Systems  Constitutional: Negative.   HENT: Negative.   Eyes: Negative.   Respiratory: Negative.   Cardiovascular: Negative.   Gastrointestinal: Negative.   Endocrine: Negative.   Genitourinary: Negative.   Musculoskeletal: Positive for arthralgias, back pain, gait problem and myalgias.       Spasms   Skin: Negative.   Allergic/Immunologic: Negative.   Hematological: Negative.   Psychiatric/Behavioral: The patient is nervous/anxious.   All other systems reviewed and are negative.      Objective:   Physical Exam Vitals signs and nursing note reviewed.  Constitutional:      Appearance: Normal appearance.  Neck:     Musculoskeletal: Normal range of motion and neck supple.  Cardiovascular:     Rate and Rhythm: Normal rate and regular rhythm.     Pulses: Normal pulses.     Heart sounds: Normal heart sounds.  Pulmonary:     Effort: Pulmonary effort is normal.     Breath sounds: Normal breath sounds.  Musculoskeletal:     Comments: Normal Muscle Bulk and Muscle Testing Reveals:  Upper Extremities: Full ROM and Muscle Strength 5/5 Bilateral AC Joint Tenderness Lumbar Paraspinal Tenderness: L-4-L-5 Lower Extremities: Full ROM and Muscle Strength 5/5 Arises from Table Slowly  Narrow Based Gait   Skin:    General: Skin is warm and dry.  Neurological:     Mental Status: She is alert and oriented to person, place, and time.  Psychiatric:        Mood and Affect: Mood normal.        Behavior: Behavior normal.           Assessment & Plan:  1.Multiple Sclerosis: Neurology Following: Dr. Shah.02/04/2018 2.Lumbar Spondylolisthesis/Low back pain/ Lumbar Radiculitis/ Neuropathic Pain: Continue Current  exercise Regime. Continue using heat therapy. Continuecurrent medication regimen withGabapentin and Pamelor. 02/04/2018. Refilled:Continue with Slow wean: OxyCODONE 10/325 mg one tablet every 6 hours as need for pain#105.02/04/2018.Continue with slow weaning of Oxycodone.. We will continue the opioid monitoring program, this consists of regular clinic visits,  examinations, urine drug screen, pill counts as well use of [place] Controlled Substance reporting System. 3. Muscle Spasms: Continuecurrent medication regimen withTizanidine.02/04/2018 4. Depression/anxiety:Psychiatry Following Dr. Percell Belt Continue Monthly Counseling. Continue Current Medication Regime. Xanax . 02/04/2018 5. Morbid obesity:Encouraged to continue with weight Loss and Healthy Living Regimen.02/04/2018 6. Insomnia:No complaintstoday.Continueto monitor.02/04/2018 7. Bilateral Greater trochanter BursitisNo complaints today..Continue Alternating Ice and Heat Therapy.02/04/2018..  20 minutes of face to face patient care time was spent during this visit. All questions were encouraged and answered.  F/U in 1 month

## 2018-03-07 ENCOUNTER — Encounter: Payer: Medicare Other | Attending: Physical Medicine & Rehabilitation | Admitting: Registered Nurse

## 2018-03-07 ENCOUNTER — Encounter: Payer: Self-pay | Admitting: Registered Nurse

## 2018-03-07 VITALS — BP 112/75 | HR 86 | Ht 62.0 in | Wt 261.0 lb

## 2018-03-07 DIAGNOSIS — M5416 Radiculopathy, lumbar region: Secondary | ICD-10-CM

## 2018-03-07 DIAGNOSIS — M545 Low back pain: Secondary | ICD-10-CM | POA: Insufficient documentation

## 2018-03-07 DIAGNOSIS — M62838 Other muscle spasm: Secondary | ICD-10-CM

## 2018-03-07 DIAGNOSIS — G35 Multiple sclerosis: Secondary | ICD-10-CM | POA: Diagnosis present

## 2018-03-07 DIAGNOSIS — Z79899 Other long term (current) drug therapy: Secondary | ICD-10-CM

## 2018-03-07 DIAGNOSIS — M792 Neuralgia and neuritis, unspecified: Secondary | ICD-10-CM | POA: Insufficient documentation

## 2018-03-07 DIAGNOSIS — Z5181 Encounter for therapeutic drug level monitoring: Secondary | ICD-10-CM | POA: Diagnosis not present

## 2018-03-07 DIAGNOSIS — G894 Chronic pain syndrome: Secondary | ICD-10-CM

## 2018-03-07 DIAGNOSIS — M4316 Spondylolisthesis, lumbar region: Secondary | ICD-10-CM | POA: Diagnosis present

## 2018-03-07 MED ORDER — OXYCODONE-ACETAMINOPHEN 10-325 MG PO TABS: 1.0000 | ORAL_TABLET | Freq: Four times a day (QID) | ORAL | 0 refills | Status: DC | PRN

## 2018-03-07 NOTE — Progress Notes (Signed)
Subjective:    Patient ID: Mariah Morris, female    DOB: 10-04-1970, 48 y.o.   MRN: [id number]  HPI: Mariah Morris is a 48 y.o. female who returns for follow up appointment for chronic pain and medication refill. She states her pain is located in her lower back radiating into her bilateral lower extremities and bilateral hips. She rates her pain 6. Her current exercise regime is walking and performing stretching exercises.  Mariah Morris equivalent is 60.58 MME. She is also prescribed  Alprazolam by Dr. Carman Ching.We have discussed the black box warning of using opioids and benzodiazepines. I highlighted the dangers of using these drugs together and discussed the adverse events including respiratory suppression, overdose, cognitive impairment and importance of compliance with current regimen. We will continue to monitor and adjust as indicated.  She is being closely monitored and under the care of [facility]   Last UDS was Performed on 06/07/2017, it was consistent. UDS was ordered today.   Pain Inventory Average Pain 6 Pain Right Now 6 My pain is aching  In the last 24 hours, has pain interfered with the following? General activity 6 Relation with others 6 Enjoyment of life 6 What TIME of day is your pain at its worst? night Sleep (in general) Fair  Pain is worse with: walking, bending, sitting, standing and some activites Pain improves with: rest, heat/ice and medication Relief from Meds: 6  Mobility use a cane  Function Do you have any goals in this area?  no  Neuro/Psych weakness trouble walking spasms confusion depression anxiety  Prior Studies Any changes since last visit?  no  Physicians involved in your care Any changes since last visit?  no   Family History  Problem Relation Age of Onset  . Hypertension Mother   . Diabetes Father   . Hypertension Father    Social History   Socioeconomic History  .  Marital status: Single    Spouse name: Not on file  . Number of children: Not on file  . Years of education: Not on file  . Highest education level: Not on file  Occupational History  . Not on file  Social Needs  . Financial resource strain: Not on file  . Food insecurity:    Worry: Not on file    Inability: Not on file  . Transportation needs:    Medical: Not on file    Non-medical: Not on file  Tobacco Use  . Smoking status: Never Smoker  . Smokeless tobacco: Never Used  Substance and Sexual Activity  . Alcohol use: No  . Drug use: Not on file  . Sexual activity: Not on file  Lifestyle  . Physical activity:    Days per week: Not on file    Minutes per session: Not on file  . Stress: Not on file  Relationships  . Social connections:    Talks on phone: Not on file    Gets together: Not on file    Attends religious service: Not on file    Active member of club or organization: Not on file    Attends meetings of clubs or organizations: Not on file    Relationship status: Not on file  Other Topics Concern  . Not on file  Social History Narrative  . Not on file   Past Surgical History:  Procedure Laterality Date  . CESAREAN SECTION     x2  . OVARIAN CYST REMOVAL  Past Medical History:  Diagnosis Date  . Chronic back pain   . GAD (generalized anxiety disorder)   . GERD (gastroesophageal reflux disease)   . Gestational diabetes   . Hypertension   . Neuromuscular disorder (HCC) 2011   MS  . Presence of permanent cardiac pacemaker   . Seizures (HCC)    BP 112/75   Pulse 86   Ht 5\' 2"  (1.575 m)   Wt 261 lb (118.4 kg)   LMP 02/21/2018   SpO2 96%   BMI 47.74 kg/m   Opioid Risk Score:   Fall Risk Score:  `1  Depression screen PHQ 2/9  Depression screen [facility] 2/9 08/06/2017 07/05/2017 06/07/2017 05/08/2017 03/09/2017 02/07/2017 05/22/2016  Decreased Interest 1 1 1 1 1 1  0  Down, Depressed, Hopeless 1 1 1 1 1 1 1   PHQ - 2 Score 2 2 2 2 2 2 1   Altered sleeping - - - 0  - - -  Tired, decreased energy - - - 1 - - -  Change in appetite - - - - - - -  Feeling bad or failure about yourself  - - - 1 - - -  Trouble concentrating - - - 0 - - -  Moving slowly or fidgety/restless - - - 0 - - -  Suicidal thoughts - - - 0 - - -  PHQ-9 Score - - - 4 - - -     Review of Systems  Constitutional: Negative.   HENT: Negative.   Eyes: Negative.   Respiratory: Negative.   Cardiovascular: Negative.   Gastrointestinal: Negative.   Endocrine: Negative.   Genitourinary: Negative.   Musculoskeletal: Positive for arthralgias, gait problem and myalgias.  Skin: Negative.   Allergic/Immunologic: Negative.   Neurological: Positive for weakness.  Hematological: Negative.   Psychiatric/Behavioral: Positive for confusion and dysphoric mood. The patient is nervous/anxious.   All other systems reviewed and are negative.      Objective:   Physical Exam Vitals signs and nursing note reviewed.  Constitutional:      Appearance: Normal appearance.  Neck:     Musculoskeletal: Normal range of motion and neck supple.  Cardiovascular:     Rate and Rhythm: Normal rate and regular rhythm.     Pulses: Normal pulses.     Heart sounds: Normal heart sounds.  Pulmonary:     Effort: Pulmonary effort is normal.     Breath sounds: Normal breath sounds.  Musculoskeletal:     Comments: Normal Muscle Bulk and Muscle Testing Reveals:  Upper Extremities: Full ROM and Muscle Strength 5/5 Right AC Joint Tenderness  Thoracic Paraspinal Tenderness: T-3-T-7 Lumbar Hypersensitivity Bilateral Greater Trochanter Tenderness Lower Extremities: Full ROM and Muscle Strength 5/5 Bilateral Lower Extremities Flexion Produces Pain into Bilateral Lower Extremities Arises from Table Slowly Narrow Based Gait   Skin:    General: Skin is warm and dry.  Neurological:     Mental Status: She is alert and oriented to person, place, and time.  Psychiatric:        Mood and Affect: Mood normal.         Behavior: Behavior normal.           Assessment & Plan:  1.Multiple Sclerosis: Neurology Following: Dr. Shah.03/07/2018 2.Lumbar Spondylolisthesis/Low back pain/ Lumbar Radiculitis/ Neuropathic Pain: Continue Current exercise Regime. Continue using heat therapy. Continuecurrent medication regimen withGabapentin and Pamelor. 02/04/2018. Refilled:Continue with Slow wean: OxyCODONE 10/325 mg one tablet every 6 hours as need for pain#105.03/07/2018.Continue with slow weaning  of Oxycodone.. We will continue the opioid monitoring program, this consists of regular clinic visits, examinations, urine drug screen, pill counts as well use of [place] Controlled Substance reporting System. 3. Muscle Spasms: Continuecurrent medication regimen withTizanidine.03/07/2018 4. Depression/anxiety:Psychiatry Following Dr. Percell Belt Continue Monthly Counseling. Continue Current Medication Regime. Xanax . 03/07/2018 5. Morbid obesity:Encouraged to continue with weight Loss and Healthy Living Regimen.03/07/2018 6. Insomnia:No complaintstoday.Continueto monitor.03/07/2018 7. Bilateral Greater trochanter Bursitis.Continue Alternating Ice and Heat Therapy.03/07/2018..  20 minutes of face to face patient care time was spent during this visit. All questions were encouraged and answered.  F/U in 1 month

## 2018-03-13 ENCOUNTER — Telehealth: Payer: Self-pay | Admitting: *Deleted

## 2018-03-13 LAB — TOXASSURE SELECT,+ANTIDEPR,UR

## 2018-03-13 NOTE — Telephone Encounter (Signed)
Urine drug screen for this encounter is consistent for prescribed medication 

## 2018-04-05 ENCOUNTER — Encounter: Payer: Medicare Other | Attending: Physical Medicine & Rehabilitation | Admitting: Registered Nurse

## 2018-04-05 ENCOUNTER — Encounter: Payer: Self-pay | Admitting: Registered Nurse

## 2018-04-05 VITALS — BP 118/79 | HR 77 | Resp 14 | Ht 62.0 in | Wt 261.0 lb

## 2018-04-05 DIAGNOSIS — M5416 Radiculopathy, lumbar region: Secondary | ICD-10-CM

## 2018-04-05 DIAGNOSIS — M792 Neuralgia and neuritis, unspecified: Secondary | ICD-10-CM | POA: Insufficient documentation

## 2018-04-05 DIAGNOSIS — M62838 Other muscle spasm: Secondary | ICD-10-CM | POA: Diagnosis not present

## 2018-04-05 DIAGNOSIS — M545 Low back pain: Secondary | ICD-10-CM | POA: Insufficient documentation

## 2018-04-05 DIAGNOSIS — Z5181 Encounter for therapeutic drug level monitoring: Secondary | ICD-10-CM

## 2018-04-05 DIAGNOSIS — G35 Multiple sclerosis: Secondary | ICD-10-CM | POA: Diagnosis present

## 2018-04-05 DIAGNOSIS — M4316 Spondylolisthesis, lumbar region: Secondary | ICD-10-CM | POA: Diagnosis not present

## 2018-04-05 DIAGNOSIS — Z79899 Other long term (current) drug therapy: Secondary | ICD-10-CM

## 2018-04-05 DIAGNOSIS — G894 Chronic pain syndrome: Secondary | ICD-10-CM

## 2018-04-05 MED ORDER — OXYCODONE-ACETAMINOPHEN 10-325 MG PO TABS: 1.0000 | ORAL_TABLET | Freq: Four times a day (QID) | ORAL | 0 refills | Status: DC | PRN

## 2018-04-05 NOTE — Progress Notes (Signed)
Subjective:    Patient ID: Mariah Morris, female    DOB: [birth year], 48 y.o.   MRN: [id number]  HPI: Mariah Morris is a 48 y.o. female who returns for follow up appointment for chronic pain and medication refill. She states her pain is located in her neck radiating into her bilateral shoulders, mid-lower back pain radiating into her bilateral  lower extremities. She rates her pain 7. Her current exercise regime is walking and performing stretching exercises.  Ms. Emami Morphine equivalent is 60.58 MME.She is also prescribed Alprazolam by [name] Pa-C. .We have discussed the black box warning of using opioids and benzodiazepines. I highlighted the dangers of using these drugs together and discussed the adverse events including respiratory suppression, overdose, cognitive impairment and importance of compliance with current regimen. We will continue to monitor and adjust as indicated.  She  is being closely monitored and under the care of her psychiatrist Dr. Percell Belt.  Last UDS was performed on 03/07/2018, it was consistent.    Pain Inventory Average Pain 7 Pain Right Now 7 My pain is intermittent, sharp, burning, stabbing and aching  In the last 24 hours, has pain interfered with the following? General activity 5 Relation with others 5 Enjoyment of life 5 What TIME of day is your pain at its worst? evening, night Sleep (in general) Fair  Pain is worse with: bending, sitting and standing Pain improves with: rest, heat/ice and medication Relief from Meds: 7  Mobility Do you have any goals in this area?  no  Function Do you have any goals in this area?  no  Neuro/Psych weakness trouble walking spasms confusion depression anxiety  Prior Studies Any changes since last visit?  no  Physicians involved in your care Any changes since last visit?  no   Family History  Problem Relation Age of Onset  . Hypertension Mother   . Diabetes Father   . Hypertension  Father    Social History   Socioeconomic History  . Marital status: Single    Spouse name: Not on file  . Number of children: Not on file  . Years of education: Not on file  . Highest education level: Not on file  Occupational History  . Not on file  Social Needs  . Financial resource strain: Not on file  . Food insecurity:    Worry: Not on file    Inability: Not on file  . Transportation needs:    Medical: Not on file    Non-medical: Not on file  Tobacco Use  . Smoking status: Never Smoker  . Smokeless tobacco: Never Used  Substance and Sexual Activity  . Alcohol use: No  . Drug use: Not on file  . Sexual activity: Not on file  Lifestyle  . Physical activity:    Days per week: Not on file    Minutes per session: Not on file  . Stress: Not on file  Relationships  . Social connections:    Talks on phone: Not on file    Gets together: Not on file    Attends religious service: Not on file    Active member of club or organization: Not on file    Attends meetings of clubs or organizations: Not on file    Relationship status: Not on file  Other Topics Concern  . Not on file  Social History Narrative  . Not on file   Past Surgical History:  Procedure Laterality Date  . CESAREAN SECTION  x2  . OVARIAN CYST REMOVAL     Past Medical History:  Diagnosis Date  . Chronic back pain   . GAD (generalized anxiety disorder)   . GERD (gastroesophageal reflux disease)   . Gestational diabetes   . Hypertension   . Neuromuscular disorder (HCC) 2011   MS  . Presence of permanent cardiac pacemaker   . Seizures (HCC)    BP 118/79   Pulse 77   Resp 14   Ht 5\' 2"  (1.575 m)   Wt 261 lb (118.4 kg)   SpO2 97%   BMI 47.74 kg/m   Opioid Risk Score:   Fall Risk Score:  `1  Depression screen PHQ 2/9  Depression screen [facility] 2/9 08/06/2017 07/05/2017 06/07/2017 05/08/2017 03/09/2017 02/07/2017 05/22/2016  Decreased Interest 1 1 1 1 1 1  0  Down, Depressed, Hopeless 1 1 1 1 1 1 1   PHQ  - 2 Score 2 2 2 2 2 2 1   Altered sleeping - - - 0 - - -  Tired, decreased energy - - - 1 - - -  Change in appetite - - - - - - -  Feeling bad or failure about yourself  - - - 1 - - -  Trouble concentrating - - - 0 - - -  Moving slowly or fidgety/restless - - - 0 - - -  Suicidal thoughts - - - 0 - - -  PHQ-9 Score - - - 4 - - -    Review of Systems  Constitutional: Negative.   HENT: Negative.   Eyes: Negative.   Respiratory: Negative.   Cardiovascular: Negative.   Gastrointestinal: Negative.   Endocrine: Negative.   Musculoskeletal: Positive for arthralgias, back pain, gait problem and myalgias.       Spasms   Skin: Negative.   Allergic/Immunologic: Negative.   Neurological: Positive for weakness.  Hematological: Negative.   Psychiatric/Behavioral: Positive for confusion and dysphoric mood. The patient is nervous/anxious.   All other systems reviewed and are negative.      Objective:   Physical Exam Vitals signs and nursing note reviewed.  Constitutional:      Appearance: Normal appearance. She is obese.  Neck:     Musculoskeletal: Normal range of motion and neck supple.     Comments: Cervical Paraspinal Tenderness: C-5-C-6 Cardiovascular:     Rate and Rhythm: Normal rate and regular rhythm.     Pulses: Normal pulses.     Heart sounds: Normal heart sounds.  Pulmonary:     Effort: Pulmonary effort is normal.     Breath sounds: Normal breath sounds.  Musculoskeletal:     Comments: Normal Muscle Bulk and Muscle Testing Reveals:  Upper Extremities:Decreased  ROM 90 Degrees and Muscle Strength 5/5  Thoracic Paraspinal Tenderness: T-7-T-9 Lumbar Paraspinal Tenderness: L-4-L-5  Lower Extremities: Decreased ROM and Muscle Strength 5/5 Arises from Table Slowly  Antalgic  Gait   Skin:    General: Skin is warm and dry.  Neurological:     Mental Status: She is oriented to person, place, and time.  Psychiatric:        Mood and Affect: Mood normal.        Behavior:  Behavior normal.           Assessment & Plan:  1.Multiple Sclerosis: Neurology Following: Dr. Shah.04/05/2018 2.Lumbar Spondylolisthesis/Low back pain/ Lumbar Radiculitis/ Neuropathic Pain: Continue Current exercise Regime. Continue using heat therapy. Continuecurrent medication regimen withGabapentin and Pamelor.04/05/2018. Refilled:Continue with Slow wean:OxyCODONE 10/325 mg one tablet  every 6 hours as need for pain#105.04/05/2018.Continue with slow weaning of Oxycodone.. We will continue the opioid monitoring program, this consists of regular clinic visits, examinations, urine drug screen, pill counts as well use of [place] Controlled Substance reporting System. 3. Muscle Spasms: Continuecurrent medication regimen withTizanidine.04/05/2018 4. Depression/anxiety:Psychiatry Following Dr. Percell Belt Continue Monthly Counseling. Continue Current Medication Regime. Xanax . 04/05/2018 5. Morbid obesity:Encouraged to continue with weight Loss and Healthy Living Regimen.04/05/2018 6. Insomnia:No complaintstoday.Continueto monitor.04/05/2018 7. Bilateral Greater trochanter Bursitis.No complaints Today. Continue Alternating Ice and Heat Therapy.04/05/2018..  20 minutes of face to face patient care time was spent during this visit. All questions were encouraged and answered.  F/U in 1 month

## 2018-04-30 ENCOUNTER — Other Ambulatory Visit: Payer: Self-pay | Admitting: Physical Medicine & Rehabilitation

## 2018-05-03 ENCOUNTER — Other Ambulatory Visit: Payer: Self-pay

## 2018-05-03 ENCOUNTER — Encounter: Payer: Medicare Other | Attending: Physical Medicine & Rehabilitation | Admitting: Registered Nurse

## 2018-05-03 ENCOUNTER — Encounter: Payer: Self-pay | Admitting: Registered Nurse

## 2018-05-03 VITALS — Ht 62.0 in | Wt 261.0 lb

## 2018-05-03 DIAGNOSIS — G894 Chronic pain syndrome: Secondary | ICD-10-CM

## 2018-05-03 DIAGNOSIS — M545 Low back pain: Secondary | ICD-10-CM | POA: Insufficient documentation

## 2018-05-03 DIAGNOSIS — Z5181 Encounter for therapeutic drug level monitoring: Secondary | ICD-10-CM

## 2018-05-03 DIAGNOSIS — M4316 Spondylolisthesis, lumbar region: Secondary | ICD-10-CM | POA: Diagnosis not present

## 2018-05-03 DIAGNOSIS — Z79899 Other long term (current) drug therapy: Secondary | ICD-10-CM

## 2018-05-03 DIAGNOSIS — M62838 Other muscle spasm: Secondary | ICD-10-CM

## 2018-05-03 DIAGNOSIS — G35 Multiple sclerosis: Secondary | ICD-10-CM | POA: Insufficient documentation

## 2018-05-03 DIAGNOSIS — M7061 Trochanteric bursitis, right hip: Secondary | ICD-10-CM

## 2018-05-03 DIAGNOSIS — M5416 Radiculopathy, lumbar region: Secondary | ICD-10-CM

## 2018-05-03 DIAGNOSIS — M792 Neuralgia and neuritis, unspecified: Secondary | ICD-10-CM | POA: Insufficient documentation

## 2018-05-03 MED ORDER — OXYCODONE-ACETAMINOPHEN 10-325 MG PO TABS: 1.0000 | ORAL_TABLET | Freq: Four times a day (QID) | ORAL | 0 refills | Status: DC | PRN

## 2018-05-03 NOTE — Progress Notes (Signed)
Subjective:    Patient ID: Mariah Morris, female    DOB: [birth year], 48 y.o.   MRN: [id number]  HPI: Mariah Morris is a 48 y.o. female her appointment was changed, due to national recommendations of social distancing due to COVID 19, an audio/video telehealth visit is felt to be most appropriate for this patient at this time.  See Chart message from today for the patient's consent to telehealth from [facility].  She states her pain is located in her middle- lower back pain radiating into her bilateral lower extremities and right hip pain. She rates her pain 7. Her  current exercise regime is walking, riding her stationary bicycle for 5- 10 minutes two days a week and performing stretching exercises.  Mariah Morris equivalent is 60.58 MME. She is also prescribed Alprazolam by [name] Pa-C. We have discussed the black box warning of using opioids and benzodiazepines. I highlighted the dangers of using these drugs together and discussed the adverse events including respiratory suppression, overdose, cognitive impairment and importance of compliance with current regimen. We will continue to monitor and adjust as indicated.  She  is being closely monitored and under the care of her psychiatrist Dr. Percell Belt.   Last UDS was Performed on 03/07/2018, it was consistent.  Mariah Morris CMA: asked Mariah Morris the Questions on the Health and History, she was in her car when he spoke with her.  This provider spoke with Mariah Morris an hour later, she was in her home.  Mariah Morris and this provider has verified, we were speaking with the correct person using two identifiers.  Pain Inventory Average Pain 7 Pain Right Now 7 My pain is constant  In the last 24 hours, has pain interfered with the following? General activity 8 Relation with others 5 Enjoyment of life 5 What TIME of day is your pain at its worst? night Sleep (in general) Fair  Pain is  worse with: walking, bending, sitting and standing Pain improves with: medication Relief from Meds: 7  Mobility walk without assistance walk with assistance use a cane  Function disabled: date disabled .  Neuro/Psych tingling trouble walking spasms depression anxiety  Prior Studies Any changes since last visit?  no  Physicians involved in your care Any changes since last visit?  no   Family History  Problem Relation Age of Onset  . Hypertension Mother   . Diabetes Father   . Hypertension Father    Social History   Socioeconomic History  . Marital status: Single    Spouse name: Not on file  . Number of children: Not on file  . Years of education: Not on file  . Highest education level: Not on file  Occupational History  . Not on file  Social Needs  . Financial resource strain: Not on file  . Food insecurity:    Worry: Not on file    Inability: Not on file  . Transportation needs:    Medical: Not on file    Non-medical: Not on file  Tobacco Use  . Smoking status: Never Smoker  . Smokeless tobacco: Never Used  Substance and Sexual Activity  . Alcohol use: No  . Drug use: Not on file  . Sexual activity: Not on file  Lifestyle  . Physical activity:    Days per week: Not on file    Minutes per session: Not on file  . Stress: Not on file  Relationships  . Social  connections:    Talks on phone: Not on file    Gets together: Not on file    Attends religious service: Not on file    Active member of club or organization: Not on file    Attends meetings of clubs or organizations: Not on file    Relationship status: Not on file  Other Topics Concern  . Not on file  Social History Narrative  . Not on file   Past Surgical History:  Procedure Laterality Date  . CESAREAN SECTION     x2  . OVARIAN CYST REMOVAL     Past Medical History:  Diagnosis Date  . Chronic back pain   . GAD (generalized anxiety disorder)   . GERD (gastroesophageal reflux  disease)   . Gestational diabetes   . Hypertension   . Neuromuscular disorder (HCC) 2011   MS  . Presence of permanent cardiac pacemaker   . Seizures (HCC)    Ht 5\' 2"  (1.575 m)   Wt 261 lb (118.4 kg)   BMI 47.74 kg/m   Opioid Risk Score:   Fall Risk Score:  `1  Depression screen PHQ 2/9  Depression screen [facility] 2/9 08/06/2017 07/05/2017 06/07/2017 05/08/2017 03/09/2017 02/07/2017 05/22/2016  Decreased Interest 1 1 1 1 1 1  0  Down, Depressed, Hopeless 1 1 1 1 1 1 1   PHQ - 2 Score 2 2 2 2 2 2 1   Altered sleeping - - - 0 - - -  Tired, decreased energy - - - 1 - - -  Change in appetite - - - - - - -  Feeling bad or failure about yourself  - - - 1 - - -  Trouble concentrating - - - 0 - - -  Moving slowly or fidgety/restless - - - 0 - - -  Suicidal thoughts - - - 0 - - -  PHQ-9 Score - - - 4 - - -    Review of Systems  Constitutional: Negative.   HENT: Negative.   Eyes: Negative.   Respiratory: Negative.   Cardiovascular: Negative.   Gastrointestinal: Negative.   Endocrine: Negative.   Musculoskeletal: Positive for back pain and gait problem.       Spasms   Skin: Negative.   Allergic/Immunologic: Negative.   Neurological:        Tingling  Psychiatric/Behavioral: Positive for dysphoric mood. The patient is nervous/anxious.        Objective:   Physical Exam Vitals signs and nursing note reviewed.  Neurological:     Mental Status: She is oriented to person, place, and time.           Assessment & Plan:  1.Multiple Sclerosis: Neurology Following: Dr. Shah.05/03/2018 2.Lumbar Spondylolisthesis/Low back pain/ Lumbar Radiculitis/ Neuropathic Pain: Continue Current exercise Regime. Continue using heat therapy. Continuecurrent medication regimen withGabapentin and Pamelor.05/03/2018. Refilled:OxyCODONE 10/325 mg one tablet every 6 hours as need for pain#105.05/03/2018.Continue with slow weaning of Oxycodone.. We will continue the opioid monitoring program, this  consists of regular clinic visits, examinations, urine drug screen, pill counts as well use of [place] Controlled Substance reporting System. 3. Muscle Spasms: Continuecurrent medication regimen withTizanidine.05/03/2018 4. Depression/anxiety:Psychiatry Following Dr. Percell Belt Continue Monthly Counseling. Continue Current Medication Regime. Xanax . 05/03/2018 5. Morbid obesity:Encouraged to continue with weight Loss and Healthy Living Regimen.05/03/2018 6. Insomnia:No complaintstoday.Continueto monitor.05/03/2018 7. Right Greater trochanter Bursitis:  Continue Alternating Ice and Heat Therapy.05/03/2018..  F/U in 1 month  Location of patient: Car/ Home Location of provider: Office  Length of Time: 10 minutes

## 2018-06-03 ENCOUNTER — Encounter: Payer: Self-pay | Admitting: Registered Nurse

## 2018-06-03 ENCOUNTER — Encounter: Payer: Medicare Other | Attending: Physical Medicine & Rehabilitation | Admitting: Registered Nurse

## 2018-06-03 ENCOUNTER — Other Ambulatory Visit: Payer: Self-pay

## 2018-06-03 VITALS — BP 120/79 | Ht 62.0 in | Wt 261.0 lb

## 2018-06-03 DIAGNOSIS — G894 Chronic pain syndrome: Secondary | ICD-10-CM

## 2018-06-03 DIAGNOSIS — M7061 Trochanteric bursitis, right hip: Secondary | ICD-10-CM

## 2018-06-03 DIAGNOSIS — M5416 Radiculopathy, lumbar region: Secondary | ICD-10-CM

## 2018-06-03 DIAGNOSIS — M62838 Other muscle spasm: Secondary | ICD-10-CM | POA: Diagnosis not present

## 2018-06-03 DIAGNOSIS — G35 Multiple sclerosis: Secondary | ICD-10-CM

## 2018-06-03 DIAGNOSIS — M7062 Trochanteric bursitis, left hip: Secondary | ICD-10-CM

## 2018-06-03 DIAGNOSIS — M792 Neuralgia and neuritis, unspecified: Secondary | ICD-10-CM | POA: Insufficient documentation

## 2018-06-03 DIAGNOSIS — Z79899 Other long term (current) drug therapy: Secondary | ICD-10-CM

## 2018-06-03 DIAGNOSIS — M545 Low back pain: Secondary | ICD-10-CM | POA: Insufficient documentation

## 2018-06-03 DIAGNOSIS — M4316 Spondylolisthesis, lumbar region: Secondary | ICD-10-CM

## 2018-06-03 DIAGNOSIS — Z5181 Encounter for therapeutic drug level monitoring: Secondary | ICD-10-CM

## 2018-06-03 MED ORDER — OXYCODONE-ACETAMINOPHEN 10-325 MG PO TABS: 1.0000 | ORAL_TABLET | Freq: Four times a day (QID) | ORAL | 0 refills | Status: DC | PRN

## 2018-06-03 NOTE — Progress Notes (Signed)
Subjective:    Patient ID: Mariah InnocentShantelle L Pautler, female    DOB: 10/16/1970, 48 y.o.   MRN: [id number]  HPI: Mariah Morris is a 48 y.o. female her appointment was changed, due to national recommendations of social distancing due to COVID 19, an audio/video telehealth visit is felt to be most appropriate for this patient at this time.  See Chart message from today for the patient's consent [facility].     She states her pain is located in her lower back radiating into her bilateral lower extremities and bilateral hip pain. She rates her pain 8. Her current exercise regime is walking and performing stretching exercises.  Ms. Ezzard Standingewman Morphine equivalent is 60.58  MME.  Last UDS was performed on 03/07/2018, it was consistent.   Angela NevinKenneth Wessling CMA asked the Health and History Questions. This provider and Purvis SheffieldKen Wessling  verified we were  speaking with the correct person using two identifiers.   Pain Inventory Average Pain 8 Pain Right Now 8 My pain is constant, sharp, burning and aching  In the last 24 hours, has pain interfered with the following? General activity 8 Relation with others 8 Enjoyment of life 4 What TIME of day is your pain at its worst? evening Sleep (in general) Fair  Pain is worse with: bending and sitting Pain improves with: therapy/exercise, pacing activities and medication Relief from Meds: 8  Mobility walk without assistance walk with assistance use a cane how many minutes can you walk? 5-10 ability to climb steps?  no do you drive?  yes  Function disabled: date disabled .  Neuro/Psych weakness tremor trouble walking spasms confusion depression anxiety  Prior Studies Any changes since last visit?  no  Physicians involved in your care Any changes since last visit?  no   Family History  Problem Relation Age of Onset  . Hypertension Mother   . Diabetes Father   . Hypertension Father    Social History   Socioeconomic History  . Marital status: Single    Spouse name: Not on file  . Number of children: Not on file  . Years of education: Not on file  . Highest education level: Not on file  Occupational History  . Not on file  Social Needs  . Financial resource strain: Not on file  . Food insecurity:    Worry: Not on file    Inability: Not on file  . Transportation needs:    Medical: Not on file    Non-medical: Not on file  Tobacco Use  . Smoking status: Never Smoker  . Smokeless tobacco: Never Used  Substance and Sexual Activity  . Alcohol use: No  . Drug use: Not on file  . Sexual activity: Not on file  Lifestyle  . Physical activity:    Days per week: Not on file    Minutes per session: Not on file  . Stress: Not on file  Relationships  . Social connections:    Talks on phone: Not on file    Gets together: Not on file    Attends religious service: Not on file    Active member of club or organization: Not on file    Attends meetings of clubs or organizations: Not on file    Relationship status: Not on file  Other Topics Concern  . Not on file  Social History Narrative  . Not on file   Past Surgical History:  Procedure Laterality Date  .  CESAREAN SECTION     x2  . OVARIAN CYST REMOVAL     Past Medical History:  Diagnosis Date  . Chronic back pain   . GAD (generalized anxiety disorder)   . GERD (gastroesophageal reflux disease)   . Gestational diabetes   . Hypertension   . Neuromuscular disorder (HCC) 2011   MS  . Presence of permanent cardiac pacemaker   . Seizures (HCC)    BP 120/79   Ht 5\' 2"  (1.575 m)   Wt 261 lb (118.4 kg)   BMI 47.74 kg/m   Opioid Risk Score:   Fall Risk Score:  `1  Depression screen PHQ 2/9  Depression screen [facility] 2/9 08/06/2017 07/05/2017 06/07/2017 05/08/2017 03/09/2017 02/07/2017 05/22/2016  Decreased Interest 1 1 1 1 1 1  0  Down, Depressed, Hopeless 1 1 1 1 1 1 1   PHQ - 2 Score 2 2 2 2 2 2 1   Altered sleeping - -  - 0 - - -  Tired, decreased energy - - - 1 - - -  Change in appetite - - - - - - -  Feeling bad or failure about yourself  - - - 1 - - -  Trouble concentrating - - - 0 - - -  Moving slowly or fidgety/restless - - - 0 - - -  Suicidal thoughts - - - 0 - - -  PHQ-9 Score - - - 4 - - -    Review of Systems  Constitutional: Negative.   HENT: Negative.   Eyes: Negative.   Respiratory: Negative.   Cardiovascular: Negative.   Gastrointestinal: Negative.   Endocrine: Negative.   Genitourinary: Negative.   Musculoskeletal: Positive for back pain and gait problem.       Spasms   Skin: Negative.   Allergic/Immunologic: Negative.   Neurological: Positive for tremors, weakness and numbness.  Psychiatric/Behavioral: Positive for confusion.       Objective:   Physical Exam Vitals signs and nursing note reviewed.  Musculoskeletal:     Comments: No Physical Exam: Virtual Visit  Neurological:     Mental Status: She is oriented to person, place, and time.           Assessment & Plan:  1.Multiple Sclerosis: Neurology Following: Dr. Shah.06/03/2018 2.Lumbar Spondylolisthesis/Low back pain/ Lumbar Radiculitis/ Neuropathic Pain: Continue Current exercise Regime. Continue using heat therapy. Continuecurrent medication regimen withGabapentin and Pamelor.06/03/2018. Refilled:OxyCODONE 10/325 mg one tablet every 6 hours as need for pain#105.05/03/2018.Continue with slow weaning of Oxycodone.. We will continue the opioid monitoring program, this consists of regular clinic visits, examinations, urine drug screen, pill counts as well use of [place] Controlled Substance reporting System. 3. Muscle Spasms: Continuecurrent medication regimen withTizanidine.06/03/2018 4. Depression/anxiety:Psychiatry Following Dr. Percell Belt Continue Monthly Counseling. Continue Current Medication Regime. Xanax . 06/03/2018 5. Morbid obesity:Encouraged to continue with weight Loss and Healthy Living  Regimen.06/03/2018 6. Insomnia:No complaintstoday.Continueto monitor.06/03/2018 7. Right Greater trochanter Bursitis: Continue Alternating Ice and Heat Therapy.06/03/2018..   Telephone Call  Location of patient: In her Home Location of provider: Office Established patient Time spent on call: 10 Minutes

## 2018-06-17 ENCOUNTER — Other Ambulatory Visit: Payer: Self-pay | Admitting: Physical Medicine & Rehabilitation

## 2018-06-17 DIAGNOSIS — M545 Low back pain, unspecified: Secondary | ICD-10-CM

## 2018-06-17 DIAGNOSIS — G35 Multiple sclerosis: Secondary | ICD-10-CM

## 2018-06-17 DIAGNOSIS — M792 Neuralgia and neuritis, unspecified: Secondary | ICD-10-CM

## 2018-07-03 ENCOUNTER — Encounter: Payer: Self-pay | Admitting: Registered Nurse

## 2018-07-03 ENCOUNTER — Encounter: Payer: Medicare Other | Attending: Physical Medicine & Rehabilitation | Admitting: Registered Nurse

## 2018-07-03 ENCOUNTER — Other Ambulatory Visit: Payer: Self-pay

## 2018-07-03 VITALS — BP 117/78 | HR 83 | Temp 98.4°F | Ht 62.0 in | Wt 270.4 lb

## 2018-07-03 DIAGNOSIS — M5416 Radiculopathy, lumbar region: Secondary | ICD-10-CM

## 2018-07-03 DIAGNOSIS — G894 Chronic pain syndrome: Secondary | ICD-10-CM

## 2018-07-03 DIAGNOSIS — M792 Neuralgia and neuritis, unspecified: Secondary | ICD-10-CM | POA: Diagnosis present

## 2018-07-03 DIAGNOSIS — M4316 Spondylolisthesis, lumbar region: Secondary | ICD-10-CM

## 2018-07-03 DIAGNOSIS — M62838 Other muscle spasm: Secondary | ICD-10-CM

## 2018-07-03 DIAGNOSIS — Z5181 Encounter for therapeutic drug level monitoring: Secondary | ICD-10-CM

## 2018-07-03 DIAGNOSIS — G35D Multiple sclerosis, unspecified: Secondary | ICD-10-CM

## 2018-07-03 DIAGNOSIS — G35 Multiple sclerosis: Secondary | ICD-10-CM | POA: Diagnosis present

## 2018-07-03 DIAGNOSIS — M545 Low back pain: Secondary | ICD-10-CM | POA: Diagnosis present

## 2018-07-03 DIAGNOSIS — Z79899 Other long term (current) drug therapy: Secondary | ICD-10-CM

## 2018-07-03 MED ORDER — OXYCODONE-ACETAMINOPHEN 10-325 MG PO TABS: 1.0000 | ORAL_TABLET | Freq: Four times a day (QID) | ORAL | 0 refills | Status: DC | PRN

## 2018-07-03 NOTE — Progress Notes (Signed)
Subjective:    Patient ID: Mariah Morris, female    DOB: 06/08/1970, 48 y.o.   MRN: [id number]  HPI: Mariah Morris is a 48 y.o. female who returns for follow up appointment for chronic pain and medication refill. She states her pain is located in her bilateral lower shoulders and lower back pain radiating into her bilateral lower extremities. She rates her pain 8. Her current exercise regime is walking.   [name] Morphine equivalent is 60. .She is also prescribed Alprazolam by [name] Pa.-CWe have discussed the black box warning of using opioids and benzodiazepines. I highlighted the dangers of using these drugs together and discussed the adverse events including respiratory suppression, overdose, cognitive impairment and importance of compliance with current regimen. We will continue to monitor and adjust as indicated.  She  is being closely monitored and under the care of her psychiatrist, Dr. Percell Belt..   Last UDS was Performed on 03/07/2018, it was consistent.    Pain Inventory Average Pain 8 Pain Right Now 8 My pain is constant, dull, stabbing, tingling and aching  In the last 24 hours, has pain interfered with the following? General activity 4 Relation with others 4 Enjoyment of life 4 What TIME of day is your pain at its worst? daytime, evening and night Sleep (in general) Fair  Pain is worse with: bending, sitting and standing Pain improves with: heat/ice, therapy/exercise and medication Relief from Meds: 7  Mobility use a cane ability to climb steps?  no do you drive?  yes  Function Do you have any goals in this area?  no  Neuro/Psych trouble walking spasms confusion depression anxiety  Prior Studies Any changes since last visit?  no  Physicians involved in your care Any changes since last visit?  no   Family History  Problem Relation Age of Onset  . Hypertension Mother   . Diabetes Father   . Hypertension Father    Social  History   Socioeconomic History  . Marital status: Single    Spouse name: Not on file  . Number of children: Not on file  . Years of education: Not on file  . Highest education level: Not on file  Occupational History  . Not on file  Social Needs  . Financial resource strain: Not on file  . Food insecurity:    Worry: Not on file    Inability: Not on file  . Transportation needs:    Medical: Not on file    Non-medical: Not on file  Tobacco Use  . Smoking status: Never Smoker  . Smokeless tobacco: Never Used  Substance and Sexual Activity  . Alcohol use: No  . Drug use: Not on file  . Sexual activity: Not on file  Lifestyle  . Physical activity:    Days per week: Not on file    Minutes per session: Not on file  . Stress: Not on file  Relationships  . Social connections:    Talks on phone: Not on file    Gets together: Not on file    Attends religious service: Not on file    Active member of club or organization: Not on file    Attends meetings of clubs or organizations: Not on file    Relationship status: Not on file  Other Topics Concern  . Not on file  Social History Narrative  . Not on file   Past Surgical History:  Procedure Laterality Date  . CESAREAN SECTION  x2  . OVARIAN CYST REMOVAL     Past Medical History:  Diagnosis Date  . Chronic back pain   . GAD (generalized anxiety disorder)   . GERD (gastroesophageal reflux disease)   . Gestational diabetes   . Hypertension   . Neuromuscular disorder (HCC) 2011   MS  . Presence of permanent cardiac pacemaker   . Seizures (HCC)    BP 117/78   Pulse 83   Temp 98.4 F (36.9 C)   Ht 5\' 2"  (1.575 m)   Wt 270 lb 6.4 oz (122.7 kg)   SpO2 94%   BMI 49.46 kg/m   Opioid Risk Score:   Fall Risk Score:  `1  Depression screen PHQ 2/9  Depression screen [facility] 2/9 07/03/2018 08/06/2017 07/05/2017 06/07/2017 05/08/2017 03/09/2017 02/07/2017  Decreased Interest 1 1 1 1 1 1 1   Down, Depressed, Hopeless 1 1 1 1 1 1 1    PHQ - 2 Score 2 2 2 2 2 2 2   Altered sleeping - - - - 0 - -  Tired, decreased energy - - - - 1 - -  Change in appetite - - - - - - -  Feeling bad or failure about yourself  - - - - 1 - -  Trouble concentrating - - - - 0 - -  Moving slowly or fidgety/restless - - - - 0 - -  Suicidal thoughts - - - - 0 - -  PHQ-9 Score - - - - 4 - -    Review of Systems  Constitutional: Negative.   HENT: Negative.   Eyes: Negative.   Respiratory: Negative.   Cardiovascular: Negative.   Gastrointestinal: Negative.   Endocrine: Negative.   Genitourinary: Negative.   Musculoskeletal: Positive for back pain.       Spasms  Skin: Negative.   Allergic/Immunologic: Negative.   Neurological: Negative.   Hematological: Negative.   Psychiatric/Behavioral: Positive for confusion and dysphoric mood. The patient is nervous/anxious.   All other systems reviewed and are negative.      Objective:   Physical Exam Vitals signs and nursing note reviewed.  Constitutional:      Appearance: Normal appearance. She is obese.  Neck:     Musculoskeletal: Normal range of motion and neck supple.  Cardiovascular:     Rate and Rhythm: Normal rate and regular rhythm.     Pulses: Normal pulses.     Heart sounds: Normal heart sounds.  Pulmonary:     Effort: Pulmonary effort is normal.     Breath sounds: Normal breath sounds.  Musculoskeletal:     Comments: Normal Muscle Bulk and Muscle Testing Reveals:  Upper Extremities: Full ROM and Muscle Strength 5/5  Lumbar Hypersensitivity Lower Extremities: Decreased ROM and Muscle Strength 4/5 Bilateral Lower Extremities Flexion Produces Pain into her Bilateral Lower Extremities Arises from chair slowly Antalgic Gait   Skin:    General: Skin is warm and dry.  Neurological:     Mental Status: She is alert and oriented to person, place, and time.  Psychiatric:        Mood and Affect: Mood normal.        Behavior: Behavior normal.           Assessment & Plan:   1.Multiple Sclerosis: Neurology Following: Dr. Shah.07/03/2018 2.Lumbar Spondylolisthesis/Low back pain/ Lumbar Radiculitis/ Neuropathic Pain: Continue Current exercise Regime. Continue using heat therapy. Continuecurrent medication regimen withGabapentin and Pamelor.07/03/2018. Refilled:OxyCODONE 10/325 mg one tablet every 6 hours as need for pain#100.07/03/2018.Continue  with slow weaning of Oxycodone.. We will continue the opioid monitoring program, this consists of regular clinic visits, examinations, urine drug screen, pill counts as well use of [place] Controlled Substance reporting System. 3. Muscle Spasms: Continuecurrent medication regimen withTizanidine.07/03/2018 4. Depression/anxiety:Psychiatry Following Dr. Percell BeltMillet Continue Monthly Counseling. Continue Current Medication Regime. Xanax . 07/03/2018 5. Morbid obesity:Encouraged to continue with weight Loss and Healthy Living Regimen.07/03/2018 6. Insomnia:No complaintstoday.Continueto monitor.07/03/2018 7.RightGreater trochanter Bursitis: No Complaints Today.Continue Alternating Ice and Heat Therapy.07/03/2018..  15 minutes of face to face patient care time was spent during this visit. All questions were encouraged and answered.  F/U in 1 Month

## 2018-08-01 ENCOUNTER — Encounter: Payer: Medicare Other | Attending: Physical Medicine & Rehabilitation | Admitting: Registered Nurse

## 2018-08-01 ENCOUNTER — Encounter: Payer: Self-pay | Admitting: Registered Nurse

## 2018-08-01 ENCOUNTER — Other Ambulatory Visit: Payer: Self-pay

## 2018-08-01 VITALS — BP 132/87 | HR 77 | Temp 97.9°F | Resp 12 | Ht 62.0 in | Wt 274.0 lb

## 2018-08-01 DIAGNOSIS — M792 Neuralgia and neuritis, unspecified: Secondary | ICD-10-CM | POA: Diagnosis present

## 2018-08-01 DIAGNOSIS — M4316 Spondylolisthesis, lumbar region: Secondary | ICD-10-CM

## 2018-08-01 DIAGNOSIS — M545 Low back pain: Secondary | ICD-10-CM | POA: Diagnosis present

## 2018-08-01 DIAGNOSIS — M7062 Trochanteric bursitis, left hip: Secondary | ICD-10-CM

## 2018-08-01 DIAGNOSIS — G894 Chronic pain syndrome: Secondary | ICD-10-CM

## 2018-08-01 DIAGNOSIS — Z79899 Other long term (current) drug therapy: Secondary | ICD-10-CM

## 2018-08-01 DIAGNOSIS — M5416 Radiculopathy, lumbar region: Secondary | ICD-10-CM | POA: Diagnosis not present

## 2018-08-01 DIAGNOSIS — M25511 Pain in right shoulder: Secondary | ICD-10-CM

## 2018-08-01 DIAGNOSIS — M62838 Other muscle spasm: Secondary | ICD-10-CM

## 2018-08-01 DIAGNOSIS — G35 Multiple sclerosis: Secondary | ICD-10-CM

## 2018-08-01 DIAGNOSIS — M25512 Pain in left shoulder: Secondary | ICD-10-CM

## 2018-08-01 DIAGNOSIS — M7061 Trochanteric bursitis, right hip: Secondary | ICD-10-CM

## 2018-08-01 DIAGNOSIS — Z5181 Encounter for therapeutic drug level monitoring: Secondary | ICD-10-CM

## 2018-08-01 DIAGNOSIS — G8929 Other chronic pain: Secondary | ICD-10-CM

## 2018-08-01 MED ORDER — OXYCODONE-ACETAMINOPHEN 10-325 MG PO TABS: 1.0000 | ORAL_TABLET | Freq: Four times a day (QID) | ORAL | 0 refills | Status: DC | PRN

## 2018-08-01 NOTE — Progress Notes (Signed)
Subjective:    Patient ID: Mariah Morris, female    DOB: 1970/04/13, 48 y.o.   MRN: [id number]  HPI: Mariah Morris is a 48 y.o. female who returns for follow up appointment for chronic pain and medication refill. She states her pain is located in her bilateral shoulders and lower back radiating into her bilateral lower extremities. Also reports bilateral hip pain.She rates her pain 7. Her current exercise regime is walking and performing stretching exercises.  [name] Morphine equivalent is 60. . She is also prescribed Alprazolam by [name] Pa-C. We have discussed the black box warning of using opioids and benzodiazepines. I highlighted the dangers of using these drugs together and discussed the adverse events including respiratory suppression, overdose, cognitive impairment and importance of compliance with current regimen. We will continue to monitor and adjust as indicated.  She is being closely monitored and under the care of her psychiatrist.  Last UDS was Performed on 03/07/2018, it was consistent.    Pain Inventory Average Pain 8 Pain Right Now 7 My pain is intermittent, burning, stabbing and aching  In the last 24 hours, has pain interfered with the following? General activity 5 Relation with others 5 Enjoyment of life 5 What TIME of day is your pain at its worst? evening, night Sleep (in general) Fair  Pain is worse with: walking, bending and sitting Pain improves with: rest, therapy/exercise and medication Relief from Meds: 8  Mobility use a cane ability to climb steps?  no do you drive?  yes  Function Do you have any goals in this area?  no  Neuro/Psych weakness trouble walking spasms dizziness confusion anxiety  Prior Studies n/a  Physicians involved in your care n/a   Family History  Problem Relation Age of Onset  . Hypertension Mother   . Diabetes Father   . Hypertension Father    Social History   Socioeconomic History   . Marital status: Single    Spouse name: Not on file  . Number of children: Not on file  . Years of education: Not on file  . Highest education level: Not on file  Occupational History  . Not on file  Social Needs  . Financial resource strain: Not on file  . Food insecurity    Worry: Not on file    Inability: Not on file  . Transportation needs    Medical: Not on file    Non-medical: Not on file  Tobacco Use  . Smoking status: Never Smoker  . Smokeless tobacco: Never Used  Substance and Sexual Activity  . Alcohol use: No  . Drug use: Not on file  . Sexual activity: Not on file  Lifestyle  . Physical activity    Days per week: Not on file    Minutes per session: Not on file  . Stress: Not on file  Relationships  . Social Musician on phone: Not on file    Gets together: Not on file    Attends religious service: Not on file    Active member of club or organization: Not on file    Attends meetings of clubs or organizations: Not on file    Relationship status: Not on file  Other Topics Concern  . Not on file  Social History Narrative  . Not on file   Past Surgical History:  Procedure Laterality Date  . CESAREAN SECTION     x2  . OVARIAN CYST REMOVAL  Past Medical History:  Diagnosis Date  . Chronic back pain   . GAD (generalized anxiety disorder)   . GERD (gastroesophageal reflux disease)   . Gestational diabetes   . Hypertension   . Neuromuscular disorder ([place]) 2011   MS  . Presence of permanent cardiac pacemaker   . Seizures ([place])    There were no vitals taken for this visit.  Opioid Risk Score:   Fall Risk Score:  `1  Depression screen PHQ 2/9  Depression screen [facility] 2/9 07/03/2018 08/06/2017 07/05/2017 06/07/2017 05/08/2017 03/09/2017 02/07/2017  Decreased Interest 1 1 1 1 1 1 1   Down, Depressed, Hopeless 1 1 1 1 1 1 1   PHQ - 2 Score 2 2 2 2 2 2 2   Altered sleeping - - - - 0 - -  Tired, decreased energy - - - - 1 - -  Change in appetite - - - - -  - -  Feeling bad or failure about yourself  - - - - 1 - -  Trouble concentrating - - - - 0 - -  Moving slowly or fidgety/restless - - - - 0 - -  Suicidal thoughts - - - - 0 - -  PHQ-9 Score - - - - 4 - -      Review of Systems     Objective:   Physical Exam Vitals signs and nursing note reviewed.  Constitutional:      Appearance: Normal appearance. She is obese.  Neck:     Musculoskeletal: Normal range of motion and neck supple.  Cardiovascular:     Rate and Rhythm: Normal rate and regular rhythm.     Pulses: Normal pulses.     Heart sounds: Normal heart sounds.  Pulmonary:     Effort: Pulmonary effort is normal.     Breath sounds: Normal breath sounds.  Musculoskeletal:     Comments: Normal Muscle Bulk and Muscle Testing Reveals:  Upper Extremities: Full ROM and Muscle Strength 5/5 Bilateral AC Joint Tenderness  Thoracic Paraspinal Tenderness: T-1-T-7  Lumbar Paraspinal Tenderness: L-4-L-5 Lower Extremities: Full ROM and Muscle Strength 5/5 Arises from Table Slowly Antalgic  Gait   Skin:    General: Skin is warm and dry.  Neurological:     Mental Status: She is oriented to person, place, and time.  Psychiatric:        Mood and Affect: Mood normal.        Behavior: Behavior normal.           Assessment & Plan:  1.Multiple Sclerosis: Neurology Following: Dr. Shah.08/01/2018 2.Lumbar Spondylolisthesis/Low back pain/ Lumbar Radiculitis/ Neuropathic Pain: Continue Current exercise Regime. Continue using heat therapy. Continuecurrent medication regimen withGabapentin and Pamelor.08/01/2018. Refilled:OxyCODONE 10/325 mg one tablet every 6 hours as need for pain#100.08/01/2018.Continue with slow weaning of Oxycodone.. We will continue the opioid monitoring program, this consists of regular clinic visits, examinations, urine drug screen, pill counts as well use of [place] Controlled Substance reporting System. 3. Muscle Spasms: Continuecurrent  medication regimen withTizanidine.08/01/2018 4. Depression/anxiety:Psychiatry Following Dr. Vena Rua Continue Monthly Counseling. Continue Current Medication Regime. Xanax . 08/01/2018 5. Morbid obesity:Encouraged to continue with weight Loss and Healthy Living Regimen.08/01/2018 6. Insomnia:No complaintstoday.Continueto monitor.08/01/2018 7.BilateralGreater trochanter Bursitis: Continue Alternating Ice and Heat Therapy.08/01/2018..  15 minutes of face to face patient care time was spent during this visit. All questions were encouraged and answered.  F/U in 1 Month

## 2018-08-20 ENCOUNTER — Emergency Department
Admission: EM | Admit: 2018-08-20 | Discharge: 2018-08-20 | Disposition: A | Discharge status: Home / Self Care | Payer: Medicare Other | Attending: Emergency Medicine | Admitting: Emergency Medicine

## 2018-08-20 ENCOUNTER — Encounter: Payer: Self-pay | Admitting: Emergency Medicine

## 2018-08-20 ENCOUNTER — Other Ambulatory Visit: Payer: Self-pay

## 2018-08-20 DIAGNOSIS — L03311 Cellulitis of abdominal wall: Secondary | ICD-10-CM | POA: Insufficient documentation

## 2018-08-20 DIAGNOSIS — I10 Essential (primary) hypertension: Secondary | ICD-10-CM | POA: Insufficient documentation

## 2018-08-20 DIAGNOSIS — Z79899 Other long term (current) drug therapy: Secondary | ICD-10-CM | POA: Diagnosis not present

## 2018-08-20 DIAGNOSIS — E119 Type 2 diabetes mellitus without complications: Secondary | ICD-10-CM | POA: Diagnosis not present

## 2018-08-20 DIAGNOSIS — L539 Erythematous condition, unspecified: Secondary | ICD-10-CM | POA: Diagnosis present

## 2018-08-20 DIAGNOSIS — Z7984 Long term (current) use of oral hypoglycemic drugs: Secondary | ICD-10-CM | POA: Diagnosis not present

## 2018-08-20 MED ORDER — CLINDAMYCIN PHOSPHATE 600 MG/4ML IJ SOLN
600.0000 mg | Freq: Once | INTRAMUSCULAR | Status: AC
  Administered 2018-08-20: 600 mg via INTRAMUSCULAR
  Filled 2018-08-20: qty 4

## 2018-08-20 MED ORDER — OXYCODONE-ACETAMINOPHEN 5-325 MG PO TABS
1.0000 | ORAL_TABLET | Freq: Once | ORAL | Status: AC
  Administered 2018-08-20: 1 via ORAL
  Filled 2018-08-20: qty 1

## 2018-08-20 MED ORDER — CLINDAMYCIN HCL 300 MG PO CAPS: 300.0000 mg | ORAL_CAPSULE | Freq: Four times a day (QID) | ORAL | 0 refills | Status: AC

## 2018-08-20 NOTE — ED Provider Notes (Signed)
[facility] Emergency Department Provider Note  ____________________________________________  Time seen: Approximately 3:21 PM  I have reviewed the triage vital signs and the nursing notes.   HISTORY  Chief Complaint Skin infection    HPI Mariah Morris is a 48 y.o. female that presents to the emergency department for evaluation of a wound with surrounding erythema to her lower abdomen.  Patient states that area has been red and swollen for about 1 week.  Area was significantly worse 4 days ago and seems to be improving.  Patient does injections for her MS in the spot out her abdomen.  She went to her neurology appointment today and was recommended to go to the walk-in clinic for antibiotics.  No drainage from site.  No fever.   Past Medical History:  Diagnosis Date  . Chronic back pain   . GAD (generalized anxiety disorder)   . GERD (gastroesophageal reflux disease)   . Gestational diabetes   . Hypertension   . Neuromuscular disorder ([place]) 2011   MS  . Presence of permanent cardiac pacemaker   . Seizures [facility])     Patient Active Problem List   Diagnosis Date Noted  . Essential hypertension 08/21/2016  . Type 2 diabetes mellitus ([place]) 08/21/2016  . GERD (gastroesophageal reflux disease) 08/21/2016  . Insomnia 12/02/2014  . GAD (generalized anxiety disorder) 07/24/2014  . Major depressive disorder 07/23/2014  . Morbid obesity ([place]) 05/27/2014  . Spondylolisthesis of lumbar region 12/10/2012  . Multiple sclerosis ([place]) 09/20/2012  . Low back pain radiating to right leg 07/19/2011  . Muscle spasms of lower extremity 07/19/2011  . Cervicalgia 05/16/2011  . Right shoulder pain 05/16/2011  . 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 Right hip pain 05/16/2011  . Neuropathic pain 05/16/2011    Past Surgical History:  Procedure Laterality Date  . CESAREAN SECTION     x2  . OVARIAN CYST REMOVAL      Prior to Admission medications    Medication Sig Start Date End Date Taking? Authorizing Provider  acetaminophen (TYLENOL) 650 MG CR tablet Take 650 mg by mouth as needed for pain.    [provider]  ALPRAZolam Duanne Moron) 1 MG tablet Take 1 mg by mouth 4 (four) times daily as needed for anxiety. Dr Erasmo Leventhal rx     [provider]  atorvastatin (LIPITOR) 10 MG tablet Take 10 mg by mouth daily.    [provider]  clindamycin (CLEOCIN) 300 MG capsule Take 1 capsule (300 mg total) by mouth 4 (four) times daily for 10 days. 08/20/18 08/30/18  Laban Emperor, PA-C  DULoxetine (CYMBALTA) 60 MG capsule Take 60 mg by mouth daily. 04/27/16   [provider]  Ferrous Sulfate (IRON) 325 (65 Fe) MG TABS Take 2 tablets by mouth daily.    [provider]  fluticasone (FLONASE) 50 MCG/ACT nasal spray Place 1 spray into both nostrils daily as needed for allergies.  06/17/13   [provider]  gabapentin (NEURONTIN) 600 MG tablet TAKE 1 TABLET BY MOUTH THREE TIMES A DAY 06/17/18   Meredith Staggers, MD  hydrochlorothiazide (HYDRODIURIL) 25 MG tablet Take 1 tablet (25 mg total) by mouth daily. 07/24/14   Withrow, Elyse Jarvis, FNP  hydrOXYzine (ATARAX/VISTARIL) 50 MG tablet Take 1 tablet (50 mg total) by mouth 3 (three) times daily as needed for anxiety. 07/24/14   Withrow, Elyse Jarvis, FNP  interferon beta-1a (REBIF) 44 MCG/0.5ML injection Inject 44 mcg into the skin 3 (three) times  a week.    [provider]  metFORMIN (GLUCOPHAGE) 1000 MG tablet Take 1 tablet by mouth 2 (two) times daily. 04/11/16   [provider]  metoprolol succinate (TOPROL-XL) 50 MG 24 hr tablet TAKE 1 TABLET (50 MG TOTAL) BY MOUTH NIGHTLY. 05/25/16   [provider]  nortriptyline (PAMELOR) 10 MG capsule Take 10 mg by mouth 2 (two) times daily.    [provider]  oxyCODONE-acetaminophen (PERCOCET) 10-325 MG tablet Take 1 tablet by mouth every 6 (six) hours as needed for pain. 08/01/18   Jones Bales,  NP  pantoprazole (PROTONIX) 40 MG tablet Take 1 tablet (40 mg total) by mouth daily. 07/24/14   Withrow, Everardo All, FNP  tiZANidine (ZANAFLEX) 4 MG tablet TAKE 1 TABLET (4 MG TOTAL) 3 (THREE) TIMES DAILY BY MOUTH. 04/30/18   Ranelle Oyster, MD    Allergies Fentanyl and Advil [ibuprofen]  Family History  Problem Relation Age of Onset  . Hypertension Mother   . Diabetes Father   . Hypertension Father     Social History Social History   Tobacco Use  . Smoking status: Never Smoker  . Smokeless tobacco: Never Used  Substance Use Topics  . Alcohol use: No  . Drug use: Not on file     Review of Systems  Constitutional: No fever/chills Respiratory: No SOB. Gastrointestinal: No abdominal pain.  No nausea, no vomiting.  Musculoskeletal: Negative for musculoskeletal pain. Skin: Negative for abrasions, lacerations, ecchymosis.  Positive for rash.   ____________________________________________   PHYSICAL EXAM:  VITAL SIGNS: ED Triage Vitals  Enc Vitals Group     BP 08/20/18 1443 116/85     Pulse Rate 08/20/18 1443 69     Resp 08/20/18 1443 16     Temp 08/20/18 1443 97.6 F (36.4 C)     Temp Source 08/20/18 1443 Oral     SpO2 08/20/18 1443 100 %     Weight 08/20/18 1444 274 lb (124.3 kg)     Height 08/20/18 1444 5\' 2"  (1.575 m)     Head Circumference --      Peak Flow --      Pain Score 08/20/18 1444 10     Pain Loc --      Pain Edu? --      Excl. in GC? --      Constitutional: Alert and oriented. Well appearing and in no acute distress. Eyes: Conjunctivae are normal. PERRL. EOMI. Head: Atraumatic. ENT:      Ears:      Nose: No congestion/rhinnorhea.      Mouth/Throat: Mucous membranes are moist.  Neck: No stridor. Cardiovascular: Normal rate, regular rhythm.  Good peripheral circulation. Respiratory: Normal respiratory effort without tachypnea or retractions. Lungs CTAB. Good air entry to the bases with no decreased or absent breath sounds. Musculoskeletal:  Full range of motion to all extremities. No gross deformities appreciated. Neurologic:  Normal speech and language. No gross focal neurologic deficits are appreciated.  Skin:  Skin is warm, dry.  1/2 cm wound with raw skin and 1 inch of circular surrounding erythema with some erythema that extends underneath the umbilicus.  No drainage.  Wound is sore to touch. Psychiatric: Mood and affect are normal. Speech and behavior are normal. Patient exhibits appropriate insight and judgement.   ____________________________________________   LABS (all labs ordered are listed, but only abnormal results are displayed)  Labs Reviewed - No data to display ____________________________________________  EKG  NSR  ____________________________________________  RADIOLOGY  No results found.  ____________________________________________    PROCEDURES  Procedure(s) performed:    Procedures    Medications  oxyCODONE-acetaminophen (PERCOCET/ROXICET) 5-325 MG per tablet 1 tablet (has no administration in time range)  clindamycin (CLEOCIN) injection 600 mg (600 mg Intramuscular Given 08/20/18 1547)     ____________________________________________   INITIAL IMPRESSION / ASSESSMENT AND PLAN / ED COURSE  Pertinent labs & imaging results that were available during my care of the patient were reviewed by me and considered in my medical decision making (see chart for details).  Review of the [place] CSRS was performed in accordance of the [facility] prior to dispensing any controlled drugs.  Patient's diagnosis is consistent with wound with surrounding cellulitis.  Vital signs and exam are reassuring.  Wound is most consistent from her abdominal injection medication for her MS.  Patient has some surrounding cellulitis.  She denies any systemic symptoms.  Wound is sore to touch.  She does not have any tenderness to palpation to the remainder of her abdomen.  IM clindamycin was given.  Patient will be  discharged home with prescriptions for clindamycin.  EKG completed because Dr. Sherryll BurgerShah requested EKG for her medication.  EKG shows normal sinus rhythm.  Patient is to follow up with PCP as directed. Patient is given ED precautions to return to the ED for any worsening or new symptoms.  Mariah Morris was evaluated in Emergency Department on 08/20/2018 for the symptoms described in the history of present illness. She was evaluated in the context of the global COVID-19 pandemic, which necessitated consideration that the patient might be at risk for infection with the SARS-CoV-2 virus that causes COVID-19. Institutional protocols and algorithms that pertain to the evaluation of patients at risk for COVID-19 are in a state of rapid change based on information released by regulatory bodies including the CDC and federal and state organizations. These policies and algorithms were followed during the patient's care in the ED.   ____________________________________________  FINAL CLINICAL IMPRESSION(S) / ED DIAGNOSES  Final diagnoses:  Cellulitis of abdominal wall      NEW MEDICATIONS STARTED DURING THIS VISIT:  ED Discharge Orders         Ordered    clindamycin (CLEOCIN) 300 MG capsule  4 times daily     08/20/18 1600              This chart was dictated using voice recognition software/Dragon. Despite best efforts to proofread, errors can occur which can change the meaning. Any change was purely unintentional.    Enid DerryWagner, Orel Cooler, PA-C 08/20/18 2308    Dionne BucySiadecki, Sebastian, MD 08/20/18 2320

## 2018-08-20 NOTE — ED Provider Notes (Signed)
-----------------------------------------   3:52 PM on 08/20/2018 -----------------------------------------  ED ECG REPORT I, Arta Silence, the attending physician, personally viewed and interpreted this ECG.  Date: 08/20/2018 EKG Time: 1549 Rate: 61 Rhythm: normal sinus rhythm QRS Axis: normal Intervals: normal ST/T Wave abnormalities: normal Narrative Interpretation: no evidence of acute ischemia    Arta Silence, MD 08/20/18 1553

## 2018-08-20 NOTE — ED Triage Notes (Signed)
Pt here with c/o lower abd skin infection from having to give herself injections for MS 3 times a week, states nothing oozing from it, site is scabbed over, but is very sore and hurts with movement. Pt states she has been using neosporin and feels it might need antibiotics. NAD.

## 2018-08-20 NOTE — Discharge Instructions (Addendum)
You may take 1 dose of your clindamycin tonight before bed.  Continue antibiotics tomorrow.  Please return the emergency department if symptoms worsen or you develop a fever.

## 2018-08-21 ENCOUNTER — Other Ambulatory Visit: Payer: Self-pay | Admitting: Neurology

## 2018-08-21 ENCOUNTER — Telehealth: Payer: Self-pay | Admitting: Registered Nurse

## 2018-08-21 DIAGNOSIS — G35 Multiple sclerosis: Secondary | ICD-10-CM

## 2018-08-21 NOTE — Telephone Encounter (Signed)
Received a call from [facility] and she stated she needed to speak with Zella Ball ASAP about what's things that's going on. But she said she will still be attending her app, she just needed to talk with [facility] ASAP.

## 2018-08-21 NOTE — Telephone Encounter (Signed)
Return Mariah Morris call, she reports she went to [facility] , she was diagnosed with Cellulitis of abdominal wall. ED note was reviewed, she was prescribed antibiotic. She will follow up with her PCP and Neurologist.

## 2018-08-28 ENCOUNTER — Other Ambulatory Visit: Payer: Self-pay

## 2018-08-28 ENCOUNTER — Encounter: Payer: Self-pay | Admitting: Registered Nurse

## 2018-08-28 ENCOUNTER — Encounter (HOSPITAL_BASED_OUTPATIENT_CLINIC_OR_DEPARTMENT_OTHER): Payer: Medicare Other | Admitting: Registered Nurse

## 2018-08-28 DIAGNOSIS — M4316 Spondylolisthesis, lumbar region: Secondary | ICD-10-CM

## 2018-08-28 MED ORDER — OXYCODONE-ACETAMINOPHEN 10-325 MG PO TABS: 1.0000 | ORAL_TABLET | Freq: Every day | ORAL | 0 refills | Status: DC | PRN

## 2018-08-28 NOTE — Progress Notes (Signed)
Subjective:    Patient ID: Mariah Morris, female    DOB: 11/25/1970, 48 y.o.   MRN: [id number]  HPI: Mariah Morris is a 48 y.o. female who returns for follow up appointment for chronic pain and medication refill. She states her pain is located in her bilateral shoulders, mid- lower back pain radiating into her bilateral hips and bilateral lower extremities. Also reports abdominal pain ( she went to [facility]  Emergency Room for abdominal pain and was diagnosed with cellulitis of abdominal wall.). No drainage noted, site was cleansed and new dressing applied, PCP following she reports.She rates her  Pain 4. Her current exercise regime is walking.  Ms. [place] Morphine equivalent is 60.00  MME. She is also prescribed Alprazolam by Lyn Henri Pa-C. We have discussed the black box warning of using opioids and benzodiazepines. I highlighted the dangers of using these drugs together and discussed the adverse events including respiratory suppression, overdose, cognitive impairment and importance of compliance with current regimen. We will continue to monitor and adjust as indicated.  She  is being closely monitored and under the care of her psychiatrist.  Pain Inventory Average Pain 4 Pain Right Now 4 My pain is constant, stabbing and aching  In the last 24 hours, has pain interfered with the following? General activity 4 Relation with others 4 Enjoyment of life 4 What TIME of day is your pain at its worst? evening, night Sleep (in general) Fair  Pain is worse with: bending, sitting and standing Pain improves with: rest, heat/ice, therapy/exercise and medication Relief from Meds: 7  Mobility use a cane  Function Do you have any goals in this area?  no  Neuro/Psych weakness trouble walking spasms dizziness depression anxiety  Prior Studies n/a  Physicians involved in your care no   Family History  Problem Relation Age of Onset  . Hypertension Mother   . Diabetes  Father   . Hypertension Father    Social History   Socioeconomic History  . Marital status: Single    Spouse name: Not on file  . Number of children: Not on file  . Years of education: Not on file  . Highest education level: Not on file  Occupational History  . Not on file  Social Needs  . Financial resource strain: Not on file  . Food insecurity    Worry: Not on file    Inability: Not on file  . Transportation needs    Medical: Not on file    Non-medical: Not on file  Tobacco Use  . Smoking status: Never Smoker  . Smokeless tobacco: Never Used  Substance and Sexual Activity  . Alcohol use: No  . Drug use: Not on file  . Sexual activity: Not on file  Lifestyle  . Physical activity    Days per week: Not on file    Minutes per session: Not on file  . Stress: Not on file  Relationships  . Social Herbalist on phone: Not on file    Gets together: Not on file    Attends religious service: Not on file    Active member of club or organization: Not on file    Attends meetings of clubs or organizations: Not on file    Relationship status: Not on file  Other Topics Concern  . Not on file  Social History Narrative  . Not on file   Past Surgical History:  Procedure Laterality Date  . CESAREAN SECTION  x2  . OVARIAN CYST REMOVAL     Past Medical History:  Diagnosis Date  . Chronic back pain   . GAD (generalized anxiety disorder)   . GERD (gastroesophageal reflux disease)   . Gestational diabetes   . Hypertension   . Neuromuscular disorder ([facility]) 2011   MS  . Presence of permanent cardiac pacemaker   . Seizures ([facility])    LMP 08/11/2018 (Approximate)   Opioid Risk Score:   Fall Risk Score:  `1  Depression screen PHQ 2/9  [facility] 07/03/2018 08/06/2017 07/05/2017 06/07/2017 05/08/2017 03/09/2017 02/07/2017  Decreased Interest 1 1 1 1 1 1 1   Down, Depressed, Hopeless 1 1 1 1 1 1 1   PHQ - 2 Score 2 2 2 2 2 2 2   Altered sleeping - - - - 0 - -   Tired, decreased energy - - - - 1 - -  Change in appetite - - - - - - -  Feeling bad or failure about yourself  - - - - 1 - -  Trouble concentrating - - - - 0 - -  Moving slowly or fidgety/restless - - - - 0 - -  Suicidal thoughts - - - - 0 - -  PHQ-9 Score - - - - 4 - -      Review of Systems  All other systems reviewed and are negative.      Objective:   Physical Exam Vitals signs and nursing note reviewed.  Constitutional:      Appearance: Normal appearance. She is obese.  Neck:     Musculoskeletal: Normal range of motion and neck supple.  Cardiovascular:     Rate and Rhythm: Normal rate and regular rhythm.     Pulses: Normal pulses.     Heart sounds: Normal heart sounds.  Pulmonary:     Effort: Pulmonary effort is normal.     Breath sounds: Normal breath sounds.  Abdominal:     General: Bowel sounds are normal.     Palpations: Abdomen is soft.     Tenderness: There is abdominal tenderness.     Comments: Tenderness noted with palpation  Musculoskeletal:     Comments: Normal Muscle Bulk and Muscle Testing Reveals:  Upper Extremities: Full  ROM and Muscle Strength 5/5 Bilateral AC Joint Tenderness  Thoracic and  Lumbar Hypersensitivity  Lower Extremities: Decreased ROM and Muscle Strength 4/5 Arises from Table Slowly  Antalgic Gait   Skin:    General: Skin is warm and dry.     Comments: Abdominal Cellulitis: Site cleansed and new dressing applied: PCP Following  Neurological:     General: No focal deficit present.     Mental Status: She is alert and oriented to person, place, and time.  Psychiatric:        Mood and Affect: Mood normal.        Behavior: Behavior normal.           Assessment & Plan:  1.Multiple Sclerosis: Neurology Following: Dr. Shah.08/28/2018 2.Lumbar Spondylolisthesis/Low back pain/ Lumbar Radiculitis/ Neuropathic Pain: Continue Current exercise Regime. Continue using heat therapy. Continuecurrent medication regimen  withGabapentin.08/28/2018. 3. Acute Pain/ Abdominal Cellulitis and Chronic Pain:  Refilled:Increased OxyCODONE 10/325 mg one tablet 5 times daily as need for pain#140.08/01/2018. We will continue the opioid monitoring program, this consists of regular clinic visits, examinations, urine drug screen, pill counts as well use of [place] Controlled Substance reporting System. 4. Muscle Spasms: Continuecurrent medication regimen withTizanidine.08/28/2018 5. Depression/anxiety:Psychiatry Following  Dr. Percell Belt Continue Monthly Counseling. Continue Current Medication Regime. Xanax . 08/28/2018 5. Morbid obesity:Encouraged to continue with weight Loss and Healthy Living Regimen.08/28/2018 6. Insomnia:No complaintstoday.Continueto monitor.08/28/2018 7.BilateralGreater trochanter Bursitis:Continue Alternating Ice and Heat Therapy.08/28/2018..  of face to face patient care time was spent during this visit. All questions were encouraged and answered.  F/U in 1 Month

## 2018-09-03 ENCOUNTER — Ambulatory Visit: Payer: Medicare Other

## 2018-09-03 ENCOUNTER — Ambulatory Visit: Admission: RE | Admit: 2018-09-03 | Payer: Medicare Other | Source: Ambulatory Visit

## 2018-09-24 ENCOUNTER — Other Ambulatory Visit: Payer: Self-pay

## 2018-09-24 ENCOUNTER — Ambulatory Visit
Admission: RE | Admit: 2018-09-24 | Discharge: 2018-09-24 | Disposition: A | Discharge status: Home / Self Care | Payer: Medicare Other | Source: Ambulatory Visit | Attending: Neurology | Admitting: Neurology

## 2018-09-24 DIAGNOSIS — G35 Multiple sclerosis: Secondary | ICD-10-CM | POA: Insufficient documentation

## 2018-09-24 LAB — POCT I-STAT CREATININE: Creatinine, Ser: 0.8 mg/dL (ref 0.44–1.00)

## 2018-09-24 MED ORDER — GADOBUTROL 1 MMOL/ML IV SOLN
10.0000 mL | Freq: Once | INTRAVENOUS | Status: AC | PRN
  Administered 2018-09-24: 17:00:00 10 mL via INTRAVENOUS

## 2018-09-26 ENCOUNTER — Encounter: Payer: Medicare Other | Attending: Physical Medicine & Rehabilitation | Admitting: Registered Nurse

## 2018-09-26 ENCOUNTER — Other Ambulatory Visit: Payer: Self-pay

## 2018-09-26 VITALS — BP 116/88 | HR 76 | Temp 98.7°F | Resp 16 | Ht 62.0 in | Wt 273.6 lb

## 2018-09-26 DIAGNOSIS — G894 Chronic pain syndrome: Secondary | ICD-10-CM

## 2018-09-26 DIAGNOSIS — M4316 Spondylolisthesis, lumbar region: Secondary | ICD-10-CM

## 2018-09-26 DIAGNOSIS — M792 Neuralgia and neuritis, unspecified: Secondary | ICD-10-CM | POA: Insufficient documentation

## 2018-09-26 DIAGNOSIS — M5416 Radiculopathy, lumbar region: Secondary | ICD-10-CM | POA: Diagnosis not present

## 2018-09-26 DIAGNOSIS — M25512 Pain in left shoulder: Secondary | ICD-10-CM

## 2018-09-26 DIAGNOSIS — G35 Multiple sclerosis: Secondary | ICD-10-CM

## 2018-09-26 DIAGNOSIS — Z5181 Encounter for therapeutic drug level monitoring: Secondary | ICD-10-CM

## 2018-09-26 DIAGNOSIS — M62838 Other muscle spasm: Secondary | ICD-10-CM

## 2018-09-26 DIAGNOSIS — M25511 Pain in right shoulder: Secondary | ICD-10-CM

## 2018-09-26 DIAGNOSIS — Z79899 Other long term (current) drug therapy: Secondary | ICD-10-CM

## 2018-09-26 DIAGNOSIS — M545 Low back pain: Secondary | ICD-10-CM | POA: Insufficient documentation

## 2018-09-26 DIAGNOSIS — G8929 Other chronic pain: Secondary | ICD-10-CM

## 2018-09-26 MED ORDER — OXYCODONE-ACETAMINOPHEN 10-325 MG PO TABS: 1.0000 | ORAL_TABLET | Freq: Every day | ORAL | 0 refills | Status: DC | PRN

## 2018-09-26 NOTE — Progress Notes (Signed)
Subjective:    Patient ID: Mariah Morris, female    DOB: [birth year], 48 y.o.   MRN: [id number]  HPI: Mariah Morris is a 48 y.o. female who returns for follow up appointment for chronic pain and medication refill. She states her pain is located in her bilateral shoulders and lower back pain radiating into her bilateral lower extremities. She rates her pain 6. Her current exercise regime is walking and performing stretching exercises.  Mariah Morris Morphine equivalent is 70.00  MME. She is also prescribed Alprazolam  by Lyn Henri Pa-C. We have discussed the black box warning of using opioids and benzodiazepines. I highlighted the dangers of using these drugs together and discussed the adverse events including respiratory suppression, overdose, cognitive impairment and importance of compliance with current regimen. We will continue to monitor and adjust as indicated.  She  is being closely monitored and under the care of her psychiatrist.   UDS ordered today.    Pain Inventory Average Pain 6 Pain Right Now 6 My pain is aching  In the last 24 hours, has pain interfered with the following? General activity 5 Relation with others 5 Enjoyment of life 5 What TIME of day is your pain at its worst? evening and night Sleep (in general) Fair  Pain is worse with: sitting and standing Pain improves with: rest, heat/ice, therapy/exercise and medication Relief from Meds: 7  Mobility walk with assistance use a cane ability to climb steps?  no do you drive?  yes Do you have any goals in this area?  no  Function Do you have any goals in this area?  no  Neuro/Psych weakness spasms confusion depression anxiety  Prior Studies CT/MRI  Physicians involved in your care Any changes since last visit?  no   Family History  Problem Relation Age of Onset  . Hypertension Mother   . Diabetes Father   . Hypertension Father    Social History   Socioeconomic History  . Marital  status: Single    Spouse name: Not on file  . Number of children: Not on file  . Years of education: Not on file  . Highest education level: Not on file  Occupational History  . Not on file  Social Needs  . Financial resource strain: Not on file  . Food insecurity    Worry: Not on file    Inability: Not on file  . Transportation needs    Medical: Not on file    Non-medical: Not on file  Tobacco Use  . Smoking status: Never Smoker  . Smokeless tobacco: Never Used  Substance and Sexual Activity  . Alcohol use: No  . Drug use: Not on file  . Sexual activity: Not on file  Lifestyle  . Physical activity    Days per week: Not on file    Minutes per session: Not on file  . Stress: Not on file  Relationships  . Social Herbalist on phone: Not on file    Gets together: Not on file    Attends religious service: Not on file    Active member of club or organization: Not on file    Attends meetings of clubs or organizations: Not on file    Relationship status: Not on file  Other Topics Concern  . Not on file  Social History Narrative  . Not on file   Past Surgical History:  Procedure Laterality Date  . CESAREAN SECTION  x2  . OVARIAN CYST REMOVAL     Past Medical History:  Diagnosis Date  . Chronic back pain   . GAD (generalized anxiety disorder)   . GERD (gastroesophageal reflux disease)   . Gestational diabetes   . Hypertension   . Neuromuscular disorder (HCC) 2011   MS  . Presence of permanent cardiac pacemaker   . Seizures (HCC)    BP 116/88 (BP Location: Right Arm, Patient Position: Sitting)   Pulse 76   Temp 98.7 F (37.1 C)   Resp 16   Ht 5\' 2"  (1.575 m)   Wt 273 lb 9.6 oz (124.1 kg)   LMP 09/24/2018 (Exact Date)   SpO2 96%   BMI 50.04 kg/m   Opioid Risk Score:   Fall Risk Score:  `1  Depression screen PHQ 2/9  [facility] 07/03/2018 08/06/2017 07/05/2017 06/07/2017 05/08/2017 03/09/2017 02/07/2017  Decreased Interest 1 1 1 1 1 1 1    Down, Depressed, Hopeless 1 1 1 1 1 1 1   PHQ - 2 Score 2 2 2 2 2 2 2   Altered sleeping - - - - 0 - -  Tired, decreased energy - - - - 1 - -  Change in appetite - - - - - - -  Feeling bad or failure about yourself  - - - - 1 - -  Trouble concentrating - - - - 0 - -  Moving slowly or fidgety/restless - - - - 0 - -  Suicidal thoughts - - - - 0 - -  PHQ-9 Score - - - - 4 - -     Review of Systems  Constitutional: Negative.   HENT: Negative.   Eyes: Negative.   Respiratory: Negative.   Cardiovascular: Negative.   Gastrointestinal: Negative.   Endocrine: Negative.   Genitourinary: Negative.   Musculoskeletal: Positive for back pain, gait problem and myalgias.  Skin: Negative.   Allergic/Immunologic: Negative.   Neurological: Positive for weakness and numbness.  Hematological: Negative.   Psychiatric/Behavioral: Positive for confusion and dysphoric mood. The patient is nervous/anxious.   All other systems reviewed and are negative.      Objective:   Physical Exam Vitals signs and nursing note reviewed.  Constitutional:      Appearance: Normal appearance.  Neck:     Musculoskeletal: Normal range of motion and neck supple.  Cardiovascular:     Rate and Rhythm: Normal rate and regular rhythm.     Pulses: Normal pulses.     Heart sounds: Normal heart sounds.  Pulmonary:     Effort: Pulmonary effort is normal.     Breath sounds: Normal breath sounds.  Musculoskeletal:     Comments: Normal Muscle Bulk and Muscle Testing Reveals:  Upper Extremities:Full  ROM and Muscle Strength 5/5 Bilateral AC Joint Tenderness  Thoracic Paraspinal Tenderness: T-1-T-3 Lumbar Hypersensitivity Lower Extremities: Full ROM and Muscle Strength 5/5 Arises from Table Slowly Narrow Based Gait   Skin:    General: Skin is warm and dry.  Neurological:     Mental Status: She is alert and oriented to person, place, and time.  Psychiatric:        Mood and Affect: Mood normal.        Behavior:  Behavior normal.           Assessment & Plan:  1.Multiple Sclerosis: Neurology Following: Dr. Shah.09/26/2018 2.Lumbar Spondylolisthesis/Low back pain/ Lumbar Radiculitis/ Neuropathic Pain: Continue Current exercise Regime. Continue using heat therapy. Continuecurrent medication regimen withGabapentin.09/26/2018. 3.  Chronic Pain: Refilled:OxyCODONE 10/325 mg one tablet 5 times daily as need for pain#140.09/26/2018. We will continue the opioid monitoring program, this consists of regular clinic visits, examinations, urine drug screen, pill counts as well use of [place] Controlled Substance reporting System. 4. Muscle Spasms: Continuecurrent medication regimen withTizanidine.09/26/2018 5. Depression/anxiety:Psychiatry Following Dr. Percell BeltMillet Continue Monthly Counseling. Continue Current Medication Regime. Xanax . 09/26/2018 5. Morbid obesity:Encouraged to continue with weight Loss and Healthy Living Regimen.09/26/2018 6. Insomnia:No complaintstoday.Continueto monitor.09/26/2018 7.BilateralGreater trochanter Bursitis:No complaints today. Continue Alternating Ice and Heat Therapy.09/26/2018..  20minutes of face to face patient care time was spent during this visit. All questions were encouraged and answered.  F/U in 1 Month

## 2018-09-27 ENCOUNTER — Encounter: Payer: Self-pay | Admitting: Registered Nurse

## 2018-09-30 LAB — TOXASSURE SELECT,+ANTIDEPR,UR

## 2018-10-01 ENCOUNTER — Telehealth: Payer: Self-pay | Admitting: *Deleted

## 2018-10-01 NOTE — Telephone Encounter (Signed)
Oral swab drug screen was consistent for prescribed medications.  ?

## 2018-10-24 ENCOUNTER — Other Ambulatory Visit: Payer: Self-pay | Admitting: Physical Medicine & Rehabilitation

## 2018-10-25 ENCOUNTER — Encounter: Payer: Self-pay | Admitting: Registered Nurse

## 2018-10-25 ENCOUNTER — Encounter: Payer: Medicare Other | Attending: Physical Medicine & Rehabilitation | Admitting: Registered Nurse

## 2018-10-25 ENCOUNTER — Other Ambulatory Visit: Payer: Self-pay

## 2018-10-25 VITALS — BP 123/81 | HR 67 | Temp 97.7°F | Ht 62.0 in | Wt 276.0 lb

## 2018-10-25 DIAGNOSIS — Z79899 Other long term (current) drug therapy: Secondary | ICD-10-CM

## 2018-10-25 DIAGNOSIS — M5416 Radiculopathy, lumbar region: Secondary | ICD-10-CM | POA: Diagnosis not present

## 2018-10-25 DIAGNOSIS — G894 Chronic pain syndrome: Secondary | ICD-10-CM

## 2018-10-25 DIAGNOSIS — M4316 Spondylolisthesis, lumbar region: Secondary | ICD-10-CM | POA: Insufficient documentation

## 2018-10-25 DIAGNOSIS — M545 Low back pain: Secondary | ICD-10-CM | POA: Insufficient documentation

## 2018-10-25 DIAGNOSIS — G35 Multiple sclerosis: Secondary | ICD-10-CM | POA: Insufficient documentation

## 2018-10-25 DIAGNOSIS — G8929 Other chronic pain: Secondary | ICD-10-CM

## 2018-10-25 DIAGNOSIS — M62838 Other muscle spasm: Secondary | ICD-10-CM

## 2018-10-25 DIAGNOSIS — M25511 Pain in right shoulder: Secondary | ICD-10-CM | POA: Diagnosis not present

## 2018-10-25 DIAGNOSIS — M25512 Pain in left shoulder: Secondary | ICD-10-CM

## 2018-10-25 DIAGNOSIS — M792 Neuralgia and neuritis, unspecified: Secondary | ICD-10-CM | POA: Insufficient documentation

## 2018-10-25 DIAGNOSIS — Z5181 Encounter for therapeutic drug level monitoring: Secondary | ICD-10-CM

## 2018-10-25 MED ORDER — OXYCODONE-ACETAMINOPHEN 10-325 MG PO TABS: 1.0000 | ORAL_TABLET | Freq: Every day | ORAL | 0 refills | Status: DC | PRN

## 2018-10-25 NOTE — Progress Notes (Signed)
Subjective:    Patient ID: Mariah Morris, female    DOB: 05/28/1970, 48 y.o.   MRN: [id number]  HPI: Mariah Morris is a 48 y.o. female who returns for follow up appointment for chronic pain and medication refill. She states her pain is located in her bilateral shoulders, mid- lower back radiating into her bilateral lower extremities.She rates her pain 7. Her current exercise regime is walking, riding her stationary bicycle two times a day for 7 minutes and performing stretching exercises.  Mariah Morris Morphine equivalent is 70.00 MME. She  is also prescribed Alprazolam  by Lyn Henri. We have discussed the black box warning of using opioids and benzodiazepines. I highlighted the dangers of using these drugs together and discussed the adverse events including respiratory suppression, overdose, cognitive impairment and importance of compliance with current regimen. We will continue to monitor and adjust as indicated.   She  is being closely monitored and under the care of her psychiatrist at [facility].   Last Oral Swab was Performed on 09/26/2018, it was consistent.    Pain Inventory Average Pain 7 Pain Right Now 7 My pain is constant, sharp, burning, stabbing, tingling and aching  In the last 24 hours, has pain interfered with the following? General activity 6 Relation with others 5 Enjoyment of life 5 What TIME of day is your pain at its worst? evening,night Sleep (in general) Fair  Pain is worse with: bending, sitting and standing Pain improves with: heat/ice, pacing activities and medication Relief from Meds: 7  Mobility walk with assistance use a cane ability to climb steps?  no do you drive?  yes Do you have any goals in this area?  no  Function Do you have any goals in this area?  no  Neuro/Psych weakness numbness tingling trouble walking spasms confusion anxiety  Prior Studies Any changes since last visit?  no  Physicians involved in  your care Any changes since last visit?  no   Family History  Problem Relation Age of Onset  . Hypertension Mother   . Diabetes Father   . Hypertension Father    Social History   Socioeconomic History  . Marital status: Single    Spouse name: Not on file  . Number of children: Not on file  . Years of education: Not on file  . Highest education level: Not on file  Occupational History  . Not on file  Social Needs  . Financial resource strain: Not on file  . Food insecurity    Worry: Not on file    Inability: Not on file  . Transportation needs    Medical: Not on file    Non-medical: Not on file  Tobacco Use  . Smoking status: Never Smoker  . Smokeless tobacco: Never Used  Substance and Sexual Activity  . Alcohol use: No  . Drug use: Not on file  . Sexual activity: Not on file  Lifestyle  . Physical activity    Days per week: Not on file    Minutes per session: Not on file  . Stress: Not on file  Relationships  . Social Herbalist on phone: Not on file    Gets together: Not on file    Attends religious service: Not on file    Active member of club or organization: Not on file    Attends meetings of clubs or organizations: Not on file    Relationship status: Not on file  Other Topics Concern  . Not on file  Social History Narrative  . Not on file   Past Surgical History:  Procedure Laterality Date  . CESAREAN SECTION     x2  . OVARIAN CYST REMOVAL     Past Medical History:  Diagnosis Date  . Chronic back pain   . GAD (generalized anxiety disorder)   . GERD (gastroesophageal reflux disease)   . Gestational diabetes   . Hypertension   . Neuromuscular disorder (HCC) 2011   MS  . Presence of permanent cardiac pacemaker   . Seizures (HCC)    Temp 97.7 F (36.5 C)   Ht 5\' 2"  (1.575 m)   Wt 276 lb (125.2 kg)   BMI 50.48 kg/m   Opioid Risk Score:   Fall Risk Score:  `1  Depression screen PHQ 2/9  Depression screen [facility] 2/9 07/03/2018  08/06/2017 07/05/2017 06/07/2017 05/08/2017 03/09/2017 02/07/2017  Decreased Interest 1 1 1 1 1 1 1   Down, Depressed, Hopeless 1 1 1 1 1 1 1   PHQ - 2 Score 2 2 2 2 2 2 2   Altered sleeping - - - - 0 - -  Tired, decreased energy - - - - 1 - -  Change in appetite - - - - - - -  Feeling bad or failure about yourself  - - - - 1 - -  Trouble concentrating - - - - 0 - -  Moving slowly or fidgety/restless - - - - 0 - -  Suicidal thoughts - - - - 0 - -  PHQ-9 Score - - - - 4 - -    Review of Systems  Constitutional: Negative.   HENT: Negative.   Eyes: Negative.   Respiratory: Negative.   Endocrine: Negative.   Genitourinary: Negative.   Musculoskeletal: Positive for arthralgias, back pain, gait problem and myalgias.       Spasms   Skin: Negative.   Allergic/Immunologic: Negative.   Neurological: Positive for weakness and numbness.       Tingling   Psychiatric/Behavioral: Positive for confusion. The patient is nervous/anxious.   All other systems reviewed and are negative.      Objective:   Physical Exam        Assessment & Plan:  1.Multiple Sclerosis: Neurology Following: Dr. Shah.10/25/2018 2.Lumbar Spondylolisthesis/Low back pain/ Lumbar Radiculitis/ Neuropathic Pain: Continue Current exercise Regime. Continue using heat therapy. Continuecurrent medication regimen withGabapentin.10/25/2018. 3.  Chronic Pain:Refilled:OxyCODONE 10/325 mg one tablet5 times dailyas need for pain#135.Begin slow weaning with goal to decreas to 4 times a day as needed for pain. 10/25/2018. We will continue the opioid monitoring program, this consists of regular clinic visits, examinations, urine drug screen, pill counts as well use of Controlled Substance reporting System. 4. Muscle Spasms: Continuecurrent medication regimen withTizanidine.10/25/2018 5. Depression/anxiety:Psychiatry Following Dr. 10/27/2018 Continue Monthly Counseling. Continue Current Medication Regime. Xanax .  10/25/2018 5. Morbid obesity:Encouraged to continue with weight Loss and Healthy Living Regimen.10/25/2018 6. Insomnia:No complaintstoday.Continueto monitor.10/25/2018 7.BilateralGreater trochanter Bursitis:No complaints today. Continue Alternating Ice and Heat Therapy.10/25/2018..  10/27/2018 of face to face patient care time was spent during this visit. All questions were encouraged and answered.  F/U in 1 Month

## 2018-11-22 ENCOUNTER — Encounter: Payer: Medicare Other | Attending: Physical Medicine & Rehabilitation | Admitting: Registered Nurse

## 2018-11-22 ENCOUNTER — Other Ambulatory Visit: Payer: Self-pay

## 2018-11-22 ENCOUNTER — Encounter: Payer: Self-pay | Admitting: Registered Nurse

## 2018-11-22 VITALS — BP 126/82 | HR 77 | Temp 98.7°F | Ht 62.0 in | Wt 272.0 lb

## 2018-11-22 DIAGNOSIS — G35 Multiple sclerosis: Secondary | ICD-10-CM

## 2018-11-22 DIAGNOSIS — M4316 Spondylolisthesis, lumbar region: Secondary | ICD-10-CM | POA: Diagnosis not present

## 2018-11-22 DIAGNOSIS — M5416 Radiculopathy, lumbar region: Secondary | ICD-10-CM | POA: Diagnosis not present

## 2018-11-22 DIAGNOSIS — M62838 Other muscle spasm: Secondary | ICD-10-CM

## 2018-11-22 DIAGNOSIS — M792 Neuralgia and neuritis, unspecified: Secondary | ICD-10-CM | POA: Diagnosis present

## 2018-11-22 DIAGNOSIS — M545 Low back pain: Secondary | ICD-10-CM | POA: Insufficient documentation

## 2018-11-22 DIAGNOSIS — Z5181 Encounter for therapeutic drug level monitoring: Secondary | ICD-10-CM

## 2018-11-22 DIAGNOSIS — Z79899 Other long term (current) drug therapy: Secondary | ICD-10-CM

## 2018-11-22 DIAGNOSIS — M5412 Radiculopathy, cervical region: Secondary | ICD-10-CM

## 2018-11-22 DIAGNOSIS — M542 Cervicalgia: Secondary | ICD-10-CM

## 2018-11-22 DIAGNOSIS — G894 Chronic pain syndrome: Secondary | ICD-10-CM

## 2018-11-22 MED ORDER — OXYCODONE-ACETAMINOPHEN 10-325 MG PO TABS: 1.0000 | ORAL_TABLET | Freq: Every day | ORAL | 0 refills | Status: DC | PRN

## 2018-11-22 NOTE — Progress Notes (Signed)
Subjective:    Patient ID: Mariah Morris, female    DOB: July 22, 1970, 48 y.o.   MRN: [id number]  HPI: [name] is a 48 y.o. female who returns for follow up appointment for chronic pain and medication refill. She states her pain is located in her neck radiating into her left shoulder s/p thyroid biopsy and mid- lower back pain radiating into her right lower extremity. She rates her  Pain 7. Her current exercise regime is walking and performing stretching exercises.  [name] Morphine equivalent is 75.00  MME. She  is also prescribed Alprazolam by [name] Pa-C .We have discussed the black box warning of using opioids and benzodiazepines. I highlighted the dangers of using these drugs together and discussed the adverse events including respiratory suppression, overdose, cognitive impairment and importance of compliance with current regimen. We will continue to monitor and adjust as indicated.   She  is being closely monitored and under the care of her psychiatrist Dr. Percell Belt    Last Oral Swab was Performed on 09/26/2018, it was consistent.   [name] had Thyroid Biopsy on 11/20/2018 at [facility] by Dr Tedd Sias.  Ms. Hoopingarner reports death in her family emotional support given.    Pain Inventory Average Pain 7 Pain Right Now 7 My pain is constant, burning, stabbing, tingling and aching  In the last 24 hours, has pain interfered with the following? General activity 5 Relation with others 5 Enjoyment of life 5 What TIME of day is your pain at its worst? evening, night  Sleep (in general) Fair  Pain is worse with: bending and sitting Pain improves with: rest, heat/ice, therapy/exercise and medication Relief from Meds: 8  Mobility walk with assistance use a cane ability to climb steps?  no do you drive?  yes Do you have any goals in this area?  no  Function disabled: date disabled . Do you have any goals in this area?  no  Neuro/Psych  weakness numbness tingling trouble walking spasms dizziness confusion anxiety  Prior Studies Any changes since last visit?  no  Physicians involved in your care Any changes since last visit?  no   Family History  Problem Relation Age of Onset  . Hypertension Mother   . Diabetes Father   . Hypertension Father    Social History   Socioeconomic History  . Marital status: Single    Spouse name: Not on file  . Number of children: Not on file  . Years of education: Not on file  . Highest education level: Not on file  Occupational History  . Not on file  Social Needs  . Financial resource strain: Not on file  . Food insecurity    Worry: Not on file    Inability: Not on file  . Transportation needs    Medical: Not on file    Non-medical: Not on file  Tobacco Use  . Smoking status: Never Smoker  . Smokeless tobacco: Never Used  Substance and Sexual Activity  . Alcohol use: No  . Drug use: Not on file  . Sexual activity: Not on file  Lifestyle  . Physical activity    Days per week: Not on file    Minutes per session: Not on file  . Stress: Not on file  Relationships  . Social Musician on phone: Not on file    Gets together: Not on file    Attends religious service: Not on file  Active member of club or organization: Not on file    Attends meetings of clubs or organizations: Not on file    Relationship status: Not on file  Other Topics Concern  . Not on file  Social History Narrative  . Not on file   Past Surgical History:  Procedure Laterality Date  . CESAREAN SECTION     x2  . OVARIAN CYST REMOVAL     Past Medical History:  Diagnosis Date  . Chronic back pain   . GAD (generalized anxiety disorder)   . GERD (gastroesophageal reflux disease)   . Gestational diabetes   . Hypertension   . Neuromuscular disorder ([place]) 2011   MS  . Presence of permanent cardiac pacemaker   . Seizures (HCC)    Wt 272 lb (123.4 kg)   BMI 49.75 kg/m    Opioid Risk Score:   Fall Risk Score:  `1  Depression screen PHQ 2/9  Depression screen [facility] 2/9 07/03/2018 08/06/2017 07/05/2017 06/07/2017 05/08/2017 03/09/2017 02/07/2017  Decreased Interest 1 1 1 1 1 1 1   Down, Depressed, Hopeless 1 1 1 1 1 1 1   PHQ - 2 Score 2 2 2 2 2 2 2   Altered sleeping - - - - 0 - -  Tired, decreased energy - - - - 1 - -  Change in appetite - - - - - - -  Feeling bad or failure about yourself  - - - - 1 - -  Trouble concentrating - - - - 0 - -  Moving slowly or fidgety/restless - - - - 0 - -  Suicidal thoughts - - - - 0 - -  PHQ-9 Score - - - - 4 - -    Review of Systems  Constitutional: Negative.   HENT: Negative.   Eyes: Negative.   Respiratory: Negative.   Endocrine:       High blood sugar  Musculoskeletal: Positive for arthralgias, back pain and gait problem.       Spasms   Skin: Negative.   Allergic/Immunologic: Negative.   Neurological: Positive for dizziness, weakness and numbness.       Tingling  Psychiatric/Behavioral: Positive for confusion. The patient is nervous/anxious.   All other systems reviewed and are negative.      Objective:   Physical Exam Vitals signs and nursing note reviewed.  Constitutional:      Appearance: Normal appearance. She is obese.  Neck:     Musculoskeletal: Normal range of motion and neck supple.     Comments: Cervical Paraspinal Tenderness C-5-C-6 Cardiovascular:     Rate and Rhythm: Normal rate and regular rhythm.     Pulses: Normal pulses.     Heart sounds: Normal heart sounds.  Pulmonary:     Effort: Pulmonary effort is normal.     Breath sounds: Normal breath sounds.  Musculoskeletal:     Comments: Normal Muscle Bulk and Muscle Testing Reveals:  Upper Extremities: Full ROM and Muscle Strength 5/5 Right AC Joint Tenderness  Thoracic Paraspinal Tenderness: T-7-T-9  Lumbar Paraspinal Tenderness: L-3-L-5 Lower Extremities: Full ROM and Muscle Strength 5/5 Left Lower Extremity Flexion Produces Pain into Left  Patella Arises from Table Slowly Antalgic Gait   Skin:    General: Skin is warm and dry.  Neurological:     Mental Status: She is alert and oriented to person, place, and time.  Psychiatric:        Mood and Affect: Mood normal.  Behavior: Behavior normal.           Assessment & Plan:  1.Multiple Sclerosis: Neurology Following: Dr. Shah.11/22/2018 2.Lumbar Spondylolisthesis/Low back pain/ Lumbar Radiculitis/ Neuropathic Pain: Continue Current exercise Regime. Continue using heat therapy. Continuecurrent medication regimen withGabapentin.11/22/2018. 3. Chronic Pain:Refilled:OxyCODONE 10/325 mg one tablet5 times dailyas need for pain#130.Continue slow weaning with goal to decreas to 4 times a day as needed for pain, she verbalizes understanding. 11/22/2018. We will continue the opioid monitoring program, this consists of regular clinic visits, examinations, urine drug screen, pill counts as well use of [place] Controlled Substance reporting System. 4. Muscle Spasms: Continuecurrent medication regimen withTizanidine.11/22/2018 5. Depression/anxiety:Psychiatry Following Dr. Percell BeltMillet Continue Monthly Counseling. Continue Current Medication Regime. Xanax . 11/22/2018 5. Morbid obesity:Encouraged to continue with weight Loss and Healthy Living Regimen.11/22/2018 6. Insomnia:No complaintstoday.Continueto monitor.11/22/2018 7.BilateralGreater trochanter Bursitis:No complaints today.Continue Alternating Ice and Heat Therapy.11/22/2018..  15minutes of face to face patient care time was spent during this visit. All questions were encouraged and answered.  F/U in 1 Month

## 2018-12-23 ENCOUNTER — Other Ambulatory Visit: Payer: Self-pay

## 2018-12-23 ENCOUNTER — Encounter: Payer: Medicare Other | Attending: Physical Medicine & Rehabilitation | Admitting: Registered Nurse

## 2018-12-23 ENCOUNTER — Encounter: Payer: Self-pay | Admitting: Registered Nurse

## 2018-12-23 VITALS — BP 118/76 | HR 75 | Temp 97.7°F | Ht 62.0 in | Wt 272.0 lb

## 2018-12-23 DIAGNOSIS — Z5181 Encounter for therapeutic drug level monitoring: Secondary | ICD-10-CM | POA: Insufficient documentation

## 2018-12-23 DIAGNOSIS — M25511 Pain in right shoulder: Secondary | ICD-10-CM

## 2018-12-23 DIAGNOSIS — M545 Low back pain: Secondary | ICD-10-CM | POA: Diagnosis present

## 2018-12-23 DIAGNOSIS — M4316 Spondylolisthesis, lumbar region: Secondary | ICD-10-CM | POA: Insufficient documentation

## 2018-12-23 DIAGNOSIS — M25512 Pain in left shoulder: Secondary | ICD-10-CM

## 2018-12-23 DIAGNOSIS — G894 Chronic pain syndrome: Secondary | ICD-10-CM

## 2018-12-23 DIAGNOSIS — G35 Multiple sclerosis: Secondary | ICD-10-CM | POA: Diagnosis present

## 2018-12-23 DIAGNOSIS — M5416 Radiculopathy, lumbar region: Secondary | ICD-10-CM

## 2018-12-23 DIAGNOSIS — Z79899 Other long term (current) drug therapy: Secondary | ICD-10-CM | POA: Insufficient documentation

## 2018-12-23 DIAGNOSIS — M62838 Other muscle spasm: Secondary | ICD-10-CM

## 2018-12-23 DIAGNOSIS — G8929 Other chronic pain: Secondary | ICD-10-CM

## 2018-12-23 DIAGNOSIS — M792 Neuralgia and neuritis, unspecified: Secondary | ICD-10-CM | POA: Diagnosis present

## 2018-12-23 MED ORDER — OXYCODONE-ACETAMINOPHEN 10-325 MG PO TABS: 1.0000 | ORAL_TABLET | Freq: Every day | ORAL | 0 refills | Status: DC | PRN

## 2018-12-23 NOTE — Progress Notes (Signed)
Subjective:    Patient ID: Mariah Morris, female    DOB: May 23, 1970, 48 y.o.   MRN: [id number]  HPI: Mariah Morris is a 49 y.o. female who returns for follow up appointment for chronic pain and medication refill. She states her pain is located in her bilateral shoulders, lower back pain radiating into her bilateral lower extremities. She rates her pain 6. Her current exercise regime is walking and performing stretching exercises.  [name] Morphine equivalent is 65.00  MME.  She  is also prescribed Alprazolam by Lyn Henri Pa-C. We have discussed the black box warning of using opioids and benzodiazepines. I highlighted the dangers of using these drugs together and discussed the adverse events including respiratory suppression, overdose, cognitive impairment and importance of compliance with current regimen. We will continue to monitor and adjust as indicated.   She is being closely monitored and under the care of her psychiatrist at [facility].   Pain Inventory Average Pain 6 Pain Right Now 6 My pain is .  In the last 24 hours, has pain interfered with the following? General activity 5 Relation with others 5 Enjoyment of life 5 What TIME of day is your pain at its worst? evening Sleep (in general) Fair  Pain is worse with: walking, bending, sitting and standing Pain improves with: rest, heat/ice, therapy/exercise and medication Relief from Meds: 5  Mobility use a cane ability to climb steps?  no do you drive?  yes  Function Do you have any goals in this area?  no  Neuro/Psych weakness numbness tingling trouble walking spasms confusion anxiety  Prior Studies Any changes since last visit?  no  Physicians involved in your care Any changes since last visit?  no   Family History  Problem Relation Age of Onset  . Hypertension Mother   . Diabetes Father   . Hypertension Father    Social History   Socioeconomic History  . Marital  status: Single    Spouse name: Not on file  . Number of children: Not on file  . Years of education: Not on file  . Highest education level: Not on file  Occupational History  . Not on file  Social Needs  . Financial resource strain: Not on file  . Food insecurity    Worry: Not on file    Inability: Not on file  . Transportation needs    Medical: Not on file    Non-medical: Not on file  Tobacco Use  . Smoking status: Never Smoker  . Smokeless tobacco: Never Used  Substance and Sexual Activity  . Alcohol use: No  . Drug use: Not on file  . Sexual activity: Not on file  Lifestyle  . Physical activity    Days per week: Not on file    Minutes per session: Not on file  . Stress: Not on file  Relationships  . Social Herbalist on phone: Not on file    Gets together: Not on file    Attends religious service: Not on file    Active member of club or organization: Not on file    Attends meetings of clubs or organizations: Not on file    Relationship status: Not on file  Other Topics Concern  . Not on file  Social History Narrative  . Not on file   Past Surgical History:  Procedure Laterality Date  . CESAREAN SECTION     x2  . OVARIAN CYST REMOVAL  Past Medical History:  Diagnosis Date  . Chronic back pain   . GAD (generalized anxiety disorder)   . GERD (gastroesophageal reflux disease)   . Gestational diabetes   . Hypertension   . Neuromuscular disorder (HCC) 2011   MS  . Presence of permanent cardiac pacemaker   . Seizures (HCC)    BP 118/76   Pulse 75   Temp 97.7 F (36.5 C)   Ht 5\' 2"  (1.575 m)   Wt 272 lb (123.4 kg)   SpO2 97%   BMI 49.75 kg/m   Opioid Risk Score:   Fall Risk Score:  `1  Depression screen PHQ 2/9  Depression screen [facility] 2/9 07/03/2018 08/06/2017 07/05/2017 06/07/2017 05/08/2017 03/09/2017 02/07/2017  Decreased Interest 1 1 1 1 1 1 1   Down, Depressed, Hopeless 1 1 1 1 1 1 1   PHQ - 2 Score 2 2 2 2 2 2 2   Altered sleeping - - - - 0 -  -  Tired, decreased energy - - - - 1 - -  Change in appetite - - - - - - -  Feeling bad or failure about yourself  - - - - 1 - -  Trouble concentrating - - - - 0 - -  Moving slowly or fidgety/restless - - - - 0 - -  Suicidal thoughts - - - - 0 - -  PHQ-9 Score - - - - 4 - -    Review of Systems  Constitutional: Negative.   HENT: Negative.   Eyes: Negative.   Respiratory: Negative.   Cardiovascular: Negative.   Gastrointestinal: Negative.   Endocrine: Negative.   Genitourinary: Negative.   Musculoskeletal: Positive for arthralgias, back pain, gait problem and myalgias.  Skin: Negative.   Allergic/Immunologic: Negative.   Neurological: Positive for weakness and numbness.  Hematological: Negative.   Psychiatric/Behavioral: Positive for confusion. The patient is nervous/anxious.   All other systems reviewed and are negative.      Objective:   Physical Exam Vitals signs and nursing note reviewed.  Constitutional:      Appearance: Normal appearance. She is obese.  Neck:     Musculoskeletal: Normal range of motion and neck supple.  Cardiovascular:     Rate and Rhythm: Normal rate and regular rhythm.     Pulses: Normal pulses.     Heart sounds: Normal heart sounds.  Pulmonary:     Effort: Pulmonary effort is normal.     Breath sounds: Normal breath sounds.  Musculoskeletal:     Comments: Normal Muscle Bulk and Muscle Testing Reveals:  Upper Extremities: Full ROM and Muscle Strength 5/5 Bilateral AC Joint Tenderness Lumbar Paraspinal Tenderness: L-3-L-5 Lower Extremities: Decreased ROM and Muscle Strength 5/5 Bilateral Lower Extremities Flexion Produces Pain into Bilateral Lower Extremities Arises from Table Slowly Narrow Based  Gait   Skin:    General: Skin is warm and dry.  Neurological:     Mental Status: She is alert and oriented to person, place, and time.  Psychiatric:        Mood and Affect: Mood normal.        Behavior: Behavior normal.            Assessment & Plan:  1.Multiple Sclerosis: Neurology Following: Dr. Shah.12/23/2018 2.Lumbar Spondylolisthesis/Low back pain/ Lumbar Radiculitis/ Neuropathic Pain: Continue Current exercise Regime. Continue using heat therapy. Continuecurrent medication regimen withGabapentin.12/23/2018. 3. Chronic Pain:Refilled:OxyCODONE 10/325 mg one tablet5 times dailyas need for pain#130.Continue slow weaning with goal to decreas to 4 times a  day as needed for pain, she verbalizes understanding.12/23/2018. We will continue the opioid monitoring program, this consists of regular clinic visits, examinations, urine drug screen, pill counts as well use of [place] Controlled Substance reporting System. 4. Muscle Spasms: Continuecurrent medication regimen withTizanidine.12/23/2018 5. Depression/anxiety:Psychiatry Following Dr. Percell Belt Continue Monthly Counseling. Continue Current Medication Regime. Xanax . 12/23/2018 5. Morbid obesity:Encouraged to continue with weight Loss and Healthy Living Regimen.12/23/2018 6. Insomnia:No complaintstoday.Continueto monitor.12/23/2018 7.BilateralGreater trochanter Bursitis:No complaints today.Continue Alternating Ice and Heat Therapy.12/23/2018..  of face to face patient care time was spent during this visit. All questions were encouraged and answered.  F/U in 1 Month

## 2018-12-28 LAB — TOXASSURE SELECT,+ANTIDEPR,UR

## 2018-12-30 ENCOUNTER — Telehealth: Payer: Self-pay | Admitting: *Deleted

## 2018-12-30 NOTE — Telephone Encounter (Signed)
Urine drug screen for this encounter is consistent for prescribed medication 

## 2019-01-21 ENCOUNTER — Other Ambulatory Visit: Payer: Self-pay

## 2019-01-21 ENCOUNTER — Encounter: Payer: Self-pay | Admitting: Registered Nurse

## 2019-01-21 ENCOUNTER — Encounter: Payer: Medicare Other | Attending: Physical Medicine & Rehabilitation | Admitting: Registered Nurse

## 2019-01-21 VITALS — BP 144/92 | HR 70 | Temp 97.5°F | Ht 62.0 in | Wt 282.8 lb

## 2019-01-21 DIAGNOSIS — G35 Multiple sclerosis: Secondary | ICD-10-CM | POA: Insufficient documentation

## 2019-01-21 DIAGNOSIS — M5416 Radiculopathy, lumbar region: Secondary | ICD-10-CM

## 2019-01-21 DIAGNOSIS — Z5181 Encounter for therapeutic drug level monitoring: Secondary | ICD-10-CM | POA: Diagnosis present

## 2019-01-21 DIAGNOSIS — M7062 Trochanteric bursitis, left hip: Secondary | ICD-10-CM

## 2019-01-21 DIAGNOSIS — M62838 Other muscle spasm: Secondary | ICD-10-CM

## 2019-01-21 DIAGNOSIS — M792 Neuralgia and neuritis, unspecified: Secondary | ICD-10-CM | POA: Insufficient documentation

## 2019-01-21 DIAGNOSIS — M4316 Spondylolisthesis, lumbar region: Secondary | ICD-10-CM | POA: Insufficient documentation

## 2019-01-21 DIAGNOSIS — Z79899 Other long term (current) drug therapy: Secondary | ICD-10-CM

## 2019-01-21 DIAGNOSIS — M545 Low back pain: Secondary | ICD-10-CM | POA: Insufficient documentation

## 2019-01-21 DIAGNOSIS — M7061 Trochanteric bursitis, right hip: Secondary | ICD-10-CM

## 2019-01-21 DIAGNOSIS — M542 Cervicalgia: Secondary | ICD-10-CM

## 2019-01-21 DIAGNOSIS — M2550 Pain in unspecified joint: Secondary | ICD-10-CM

## 2019-01-21 DIAGNOSIS — G894 Chronic pain syndrome: Secondary | ICD-10-CM

## 2019-01-21 MED ORDER — OXYCODONE-ACETAMINOPHEN 10-325 MG PO TABS: 1.0000 | ORAL_TABLET | Freq: Every day | ORAL | 0 refills | Status: DC | PRN

## 2019-01-21 NOTE — Progress Notes (Signed)
Subjective:    Patient ID: Mariah Morris, female    DOB: [birth year], 48 y.o.   MRN: [id number]  HPI: Mariah Morris is a 48 y.o. female who returns for follow up appointment for chronic pain and medication refill. She states her pain is located in her neck, mid- lower back pain radiating into her bilateral lower extremities and bilateral hip pain. Also reports joint pain in her bilateral hands. She rates her pain 6. Her current exercise regime is walking, she was encouraged to increase her HEP as tolerated.   [name] Morphine equivalent is 75.00 MME. She  is also prescribed Alprazolam  by [name] Pa-C. We have discussed the black box warning of using opioids and benzodiazepines. I highlighted the dangers of using these drugs together and discussed the adverse events including respiratory suppression, overdose, cognitive impairment and importance of compliance with current regimen. We will continue to monitor and adjust as indicated.   She is being closely monitored and under the care of her psychiatrist at [facility].   Last UDS was Performed on 12/23/2018, it was consistent.   Pain Inventory Average Pain 6 Pain Right Now 6 My pain is constant, tingling and aching  In the last 24 hours, has pain interfered with the following? General activity 5 Relation with others 5 Enjoyment of life 3 What TIME of day is your pain at its worst? evening and night Sleep (in general) Fair  Pain is worse with: bending, sitting and standing Pain improves with: medication Relief from Meds: 7  Mobility use a cane ability to climb steps?  no do you drive?  yes  Function Do you have any goals in this area?  no  Neuro/Psych numbness tingling trouble walking spasms confusion depression anxiety  Prior Studies Any changes since last visit?  no  Physicians involved in your care Any changes since last visit?  no   Family History  Problem Relation Age of Onset    . Hypertension Mother   . Diabetes Father   . Hypertension Father    Social History   Socioeconomic History  . Marital status: Single    Spouse name: Not on file  . Number of children: Not on file  . Years of education: Not on file  . Highest education level: Not on file  Occupational History  . Not on file  Tobacco Use  . Smoking status: Never Smoker  . Smokeless tobacco: Never Used  Substance and Sexual Activity  . Alcohol use: No  . Drug use: Not on file  . Sexual activity: Not on file  Other Topics Concern  . Not on file  Social History Narrative  . Not on file   Social Determinants of Health   Financial Resource Strain:   . Difficulty of Paying Living Expenses: Not on file  Food Insecurity:   . Worried About Programme researcher, broadcasting/film/video in the Last Year: Not on file  . Ran Out of Food in the Last Year: Not on file  Transportation Needs:   . Lack of Transportation (Medical): Not on file  . Lack of Transportation (Non-Medical): Not on file  Physical Activity:   . Days of Exercise per Week: Not on file  . Minutes of Exercise per Session: Not on file  Stress:   . Feeling of Stress : Not on file  Social Connections:   . Frequency of Communication with Friends and Family: Not on file  . Frequency of Social Gatherings with  Friends and Family: Not on file  . Attends Religious Services: Not on file  . Active Member of Clubs or Organizations: Not on file  . Attends Banker Meetings: Not on file  . Marital Status: Not on file   Past Surgical History:  Procedure Laterality Date  . CESAREAN SECTION     x2  . OVARIAN CYST REMOVAL     Past Medical History:  Diagnosis Date  . Chronic back pain   . GAD (generalized anxiety disorder)   . GERD (gastroesophageal reflux disease)   . Gestational diabetes   . Hypertension   . Neuromuscular disorder (HCC) 2011   MS  . Presence of permanent cardiac pacemaker   . Seizures (HCC)    BP (!) 144/92   Pulse 70   Temp  (!) 97.5 F (36.4 C)   Ht 5\' 2"  (1.575 m)   Wt 282 lb 12.8 oz (128.3 kg)   SpO2 97%   BMI 51.72 kg/m   Opioid Risk Score:   Fall Risk Score:  `1  Depression screen PHQ 2/9  Depression screen [facility] 2/9 07/03/2018 08/06/2017 07/05/2017 06/07/2017 05/08/2017 03/09/2017 02/07/2017  Decreased Interest 1 1 1 1 1 1 1   Down, Depressed, Hopeless 1 1 1 1 1 1 1   PHQ - 2 Score 2 2 2 2 2 2 2   Altered sleeping - - - - 0 - -  Tired, decreased energy - - - - 1 - -  Change in appetite - - - - - - -  Feeling bad or failure about yourself  - - - - 1 - -  Trouble concentrating - - - - 0 - -  Moving slowly or fidgety/restless - - - - 0 - -  Suicidal thoughts - - - - 0 - -  PHQ-9 Score - - - - 4 - -     Review of Systems     Objective:   Physical Exam Vitals and nursing note reviewed.  Constitutional:      Appearance: Normal appearance. She is obese.  Neck:     Comments: Cervical Paraspinal Tenderness: C-5-C-6 Cardiovascular:     Rate and Rhythm: Normal rate and regular rhythm.     Pulses: Normal pulses.     Heart sounds: Normal heart sounds.  Pulmonary:     Effort: Pulmonary effort is normal.     Breath sounds: Normal breath sounds.  Musculoskeletal:     Cervical back: Normal range of motion and neck supple.     Comments: Normal Muscle Bulk and Muscle Testing Reveals:  Upper Extremities: Full ROM and Muscle Strength 5/5 Thoracic Paraspinal Tenderness: T-7-T-9 Lumbar Paraspinal tenderness: L-3-L-5 Lower Extremities: Decreased ROM and Muscle Strength 5/5 Bilateral Lower Extremities Flexion Produces Pain into Bilateral Lower Extremities Arises from Table Slowly Antalgic Gait   Skin:    General: Skin is warm and dry.  Neurological:     Mental Status: She is alert and oriented to person, place, and time.  Psychiatric:        Mood and Affect: Mood normal.        Behavior: Behavior normal.           Assessment & Plan:  1.Multiple Sclerosis: Neurology Following: Dr.  Shah.01/21/2019 2.Lumbar Spondylolisthesis/Low back pain/ Lumbar Radiculitis/ Neuropathic Pain: Continue Current exercise Regime. Continue using heat therapy. Continuecurrent medication regimen withGabapentin.01/21/2019. 3. Chronic Pain:Refilled:OxyCODONE 10/325 mg one tablet5 times dailyas need for pain#130.We will continue withslow weaning with a goal to decreas to 4  times a day as needed for pain, she verbalizes understanding.01/21/2019. We will continue the opioid monitoring program, this consists of regular clinic visits, examinations, urine drug screen, pill counts as well use of [place] Controlled Substance reporting System. 4. Muscle Spasms: Continuecurrent medication regimen withTizanidine.01/21/2019 5. Depression/anxiety:Psychiatry Following Dr. Vena Rua Continue Monthly Counseling. Continue Current Medication Regime. Xanax .01/21/2019 5. Morbid obesity:Encouraged to continue with weight Loss and Healthy Living Regimen.01/21/2019 6. Insomnia:No complaintstoday.Continueto monitor.01/21/2019 7.BilateralGreater trochanter Bursitis:Continue Alternating Ice and Heat Therapy.01/21/2019.. 8. Polyarthralgia: Bilateral Hands: Continue current medication regimen. Continue to monitor.   44minutes of face to face patient care time was spent during this visit. All questions were encouraged and answered.  F/U in 1 Month

## 2019-02-21 ENCOUNTER — Encounter: Payer: Self-pay | Admitting: Registered Nurse

## 2019-02-21 ENCOUNTER — Other Ambulatory Visit: Payer: Self-pay

## 2019-02-21 ENCOUNTER — Encounter: Payer: Medicare Other | Attending: Physical Medicine & Rehabilitation | Admitting: Registered Nurse

## 2019-02-21 VITALS — BP 133/80 | HR 76 | Ht 62.0 in | Wt 274.0 lb

## 2019-02-21 DIAGNOSIS — M5416 Radiculopathy, lumbar region: Secondary | ICD-10-CM

## 2019-02-21 DIAGNOSIS — G894 Chronic pain syndrome: Secondary | ICD-10-CM

## 2019-02-21 DIAGNOSIS — Z79899 Other long term (current) drug therapy: Secondary | ICD-10-CM | POA: Insufficient documentation

## 2019-02-21 DIAGNOSIS — M2550 Pain in unspecified joint: Secondary | ICD-10-CM

## 2019-02-21 DIAGNOSIS — M792 Neuralgia and neuritis, unspecified: Secondary | ICD-10-CM | POA: Insufficient documentation

## 2019-02-21 DIAGNOSIS — M545 Low back pain: Secondary | ICD-10-CM | POA: Insufficient documentation

## 2019-02-21 DIAGNOSIS — Z5181 Encounter for therapeutic drug level monitoring: Secondary | ICD-10-CM

## 2019-02-21 DIAGNOSIS — M4316 Spondylolisthesis, lumbar region: Secondary | ICD-10-CM | POA: Diagnosis not present

## 2019-02-21 DIAGNOSIS — M62838 Other muscle spasm: Secondary | ICD-10-CM | POA: Diagnosis not present

## 2019-02-21 DIAGNOSIS — G35 Multiple sclerosis: Secondary | ICD-10-CM | POA: Insufficient documentation

## 2019-02-21 MED ORDER — OXYCODONE-ACETAMINOPHEN 10-325 MG PO TABS: 1.0000 | ORAL_TABLET | Freq: Every day | ORAL | 0 refills | Status: DC | PRN

## 2019-02-21 NOTE — Progress Notes (Signed)
Subjective:    Patient ID: Mariah Morris, female    DOB: 09/10/1970, 49 y.o.   MRN: [id number]  HPI: Mariah Morris is a 49 y.o. female whose appointment was changed to a tele-heath visit. Mariah Morris arrived to office with her children, policy was reviewed with Mariah Morris and her appointment was changed to a tele-health visit, she agrees and verbalizes understanding. She states her pain is located in her bilateral hand pain and  mid- lower back radiating into her bilateral lower extremities.  She rates her pain 6. Her current exercise regime is walking and performing stretching exercises.  Mariah Morris Morphine equivalent is 75.00 MME. She  is also prescribed Alprazolam by Mariah Morris. .We have discussed the black box warning of using opioids and benzodiazepines. I highlighted the dangers of using these drugs together and discussed the adverse events including respiratory suppression, overdose, cognitive impairment and importance of compliance with current regimen. We will continue to monitor and adjust as indicated.   She  is being closely monitored and under the Morris of her psychiatrist ar Mariah Morris Mariah Morris    Last UDS was performed on 12/23/2018, it was consistent.   Pain Inventory Average Pain 6 Pain Right Now 6 My pain is constant, burning, tingling and aching  In the last 24 hours, has pain interfered with the following? General activity 5 Relation with others 4 Enjoyment of life 4 What TIME of day is your pain at its worst? evening and night Sleep (in general) Fair  Pain is worse with: walking, bending, sitting and standing Pain improves with: rest, heat/ice and medication Relief from Meds: 8  Mobility use a cane ability to climb steps?  no do you drive?  yes  Function Do you have any goals in this area?  no  Neuro/Psych weakness numbness tingling trouble walking spasms dizziness confusion anxiety  Prior Studies Any changes since  last visit?  no  Physicians involved in your Morris Any changes since last visit?  no   Family History  Problem Relation Age of Onset  . Hypertension Mother   . Diabetes Father   . Hypertension Father    Social History   Socioeconomic History  . Marital status: Single    Spouse name: Not on file  . Number of children: Not on file  . Years of education: Not on file  . Highest education level: Not on file  Occupational History  . Not on file  Tobacco Use  . Smoking status: Never Smoker  . Smokeless tobacco: Never Used  Substance and Sexual Activity  . Alcohol use: No  . Drug use: Not on file  . Sexual activity: Not on file  Other Topics Concern  . Not on file  Social History Narrative  . Not on file   Social Determinants of Health   Financial Resource Strain:   . Difficulty of Paying Living Expenses: Not on file  Food Insecurity:   . Worried About Programme researcher, broadcasting/film/video in the Last Year: Not on file  . Ran Out of Food in the Last Year: Not on file  Transportation Needs:   . Lack of Transportation (Medical): Not on file  . Lack of Transportation (Non-Medical): Not on file  Physical Activity:   . Days of Exercise per Week: Not on file  . Minutes of Exercise per Session: Not on file  Stress:   . Feeling of Stress : Not on file  Social Connections:   .  Frequency of Communication with Friends and Family: Not on file  . Frequency of Social Gatherings with Friends and Family: Not on file  . Attends Religious Services: Not on file  . Active Member of Clubs or Organizations: Not on file  . Attends Archivist Meetings: Not on file  . Marital Status: Not on file   Past Surgical History:  Procedure Laterality Date  . CESAREAN SECTION     x2  . OVARIAN CYST REMOVAL     Past Medical History:  Diagnosis Date  . Chronic back pain   . GAD (generalized anxiety disorder)   . GERD (gastroesophageal reflux disease)   . Gestational diabetes   . Hypertension   .  Neuromuscular disorder ([place]) 2011   MS  . Presence of permanent cardiac pacemaker   . Seizures (HCC)    BP 133/80 Comment: pt reported, virtual visit  Pulse 76 Comment: pt reported, virtual visit  Ht 5\' 2"  (1.575 m)   Wt 274 lb (124.3 kg) Comment: pt reported, virtual visit  BMI 50.12 kg/m   Opioid Risk Score:   Fall Risk Score:  `1  Depression screen PHQ 2/9  Depression screen [facility] 2/9 07/03/2018 08/06/2017 07/05/2017 06/07/2017 05/08/2017 03/09/2017 02/07/2017  Decreased Interest 1 1 1 1 1 1 1   Down, Depressed, Hopeless 1 1 1 1 1 1 1   PHQ - 2 Score 2 2 2 2 2 2 2   Altered sleeping - - - - 0 - -  Tired, decreased energy - - - - 1 - -  Change in appetite - - - - - - -  Feeling bad or failure about yourself  - - - - 1 - -  Trouble concentrating - - - - 0 - -  Moving slowly or fidgety/restless - - - - 0 - -  Suicidal thoughts - - - - 0 - -  PHQ-9 Score - - - - 4 - -    Review of Systems  Musculoskeletal: Positive for gait problem.  Neurological: Positive for dizziness, weakness and numbness.  Psychiatric/Behavioral: Positive for confusion. The patient is nervous/anxious.   All other systems reviewed and are negative.      Objective:   Physical Exam Vitals and nursing note reviewed.  Musculoskeletal:     Comments: No Physical Exam Performed: Virtual Visit           Assessment & Plan:  1.Multiple Sclerosis: Neurology Following: Mariah Morris.02/21/2019 2.Lumbar Spondylolisthesis/Low back pain/ Lumbar Radiculitis/ Neuropathic Pain: Continue Current exercise Regime. Continue using heat therapy. Continuecurrent medication regimen withGabapentin.02/21/2019. 3. Chronic Pain:Refilled:OxyCODONE 10/325 mg one tablet5 times dailyas need for pain#130.We will continue withslow weaning with a goal to decrease to 4 times a day as needed for pain, she verbalizes understanding.02/21/2019. We will continue the opioid monitoring program, this consists of regular clinic visits,  examinations, urine drug screen, pill counts as well use of [place] Controlled Substance reporting System. 4. Muscle Spasms: Continuecurrent medication regimen withTizanidine.02/21/2019 5. Depression/anxiety:Psychiatry Following Mariah Morris Continue Monthly Counseling. Continue Current Medication Regime. Xanax .02/21/2019 5. Morbid obesity:Encouraged to continue with weight Loss and Healthy Living Regimen.02/21/2019 6. Insomnia:No complaintstoday.Continueto monitor.02/21/2019 7.BilateralGreater trochanter Bursitis:No complaints today. Continue Alternating Ice and Heat Therapy.02/21/2019.. 8. Polyarthralgia: Bilateral Hands: Continue current medication regimen. Continue to monitor. 02/21/2019.  F/U in 1 Month  Tele-Health Visit Telephone Call Established Patient Location of Patient: In Her Home Location of Provider: Office

## 2019-03-10 ENCOUNTER — Other Ambulatory Visit: Payer: Self-pay | Admitting: Physical Medicine & Rehabilitation

## 2019-03-21 ENCOUNTER — Encounter: Payer: Medicare Other | Admitting: Registered Nurse

## 2019-03-24 ENCOUNTER — Encounter: Payer: Self-pay | Admitting: Registered Nurse

## 2019-03-24 ENCOUNTER — Encounter: Payer: Medicare Other | Attending: Physical Medicine & Rehabilitation | Admitting: Registered Nurse

## 2019-03-24 ENCOUNTER — Other Ambulatory Visit: Payer: Self-pay

## 2019-03-24 VITALS — BP 138/84 | HR 73 | Temp 97.5°F | Ht 62.0 in | Wt 282.0 lb

## 2019-03-24 DIAGNOSIS — M5416 Radiculopathy, lumbar region: Secondary | ICD-10-CM | POA: Diagnosis not present

## 2019-03-24 DIAGNOSIS — M4316 Spondylolisthesis, lumbar region: Secondary | ICD-10-CM | POA: Diagnosis not present

## 2019-03-24 DIAGNOSIS — G35 Multiple sclerosis: Secondary | ICD-10-CM | POA: Diagnosis present

## 2019-03-24 DIAGNOSIS — M25511 Pain in right shoulder: Secondary | ICD-10-CM

## 2019-03-24 DIAGNOSIS — Z79899 Other long term (current) drug therapy: Secondary | ICD-10-CM | POA: Insufficient documentation

## 2019-03-24 DIAGNOSIS — Z5181 Encounter for therapeutic drug level monitoring: Secondary | ICD-10-CM | POA: Insufficient documentation

## 2019-03-24 DIAGNOSIS — M25512 Pain in left shoulder: Secondary | ICD-10-CM

## 2019-03-24 DIAGNOSIS — M792 Neuralgia and neuritis, unspecified: Secondary | ICD-10-CM | POA: Diagnosis present

## 2019-03-24 DIAGNOSIS — M62838 Other muscle spasm: Secondary | ICD-10-CM

## 2019-03-24 DIAGNOSIS — G8929 Other chronic pain: Secondary | ICD-10-CM

## 2019-03-24 DIAGNOSIS — G894 Chronic pain syndrome: Secondary | ICD-10-CM

## 2019-03-24 DIAGNOSIS — M545 Low back pain: Secondary | ICD-10-CM | POA: Diagnosis present

## 2019-03-24 MED ORDER — OXYCODONE-ACETAMINOPHEN 10-325 MG PO TABS: 1.0000 | ORAL_TABLET | Freq: Every day | ORAL | 0 refills | Status: DC | PRN

## 2019-03-24 NOTE — Progress Notes (Signed)
Subjective:    Patient ID: Mariah Morris, female    DOB: Mar 16, 1970, 49 y.o.   MRN: [id number]  HPI: Mariah Morris is a 49 y.o. female who returns for follow up appointment for chronic pain and medication refill. She states her pain is located in her bilateral shoulders, lower back pain radiating into her bilateral lower extremities and bilateral knee pain. Also reports generalized joint pain all over. She rates her pain 6. Her current exercise regime is walking and performing stretching exercises.  [name] Morphine equivalent is 75.00 MME. She is also prescribed Alprazolam by [name] Pa-C. We have discussed the black box warning of using opioids and benzodiazepines. I highlighted the dangers of using these drugs together and discussed the adverse events including respiratory suppression, overdose, cognitive impairment and importance of compliance with current regimen. We will continue to monitor and adjust as indicated.  She  is being closely monitored and under the care of her psychiatrist at [facility].     Last UDS was Performed on 12/23/2018, it was consistent.   Pain Inventory Average Pain 6 Pain Right Now 6 My pain is constant, burning, tingling and aching  In the last 24 hours, has pain interfered with the following? General activity 5 Relation with others 5 Enjoyment of life 5 What TIME of day is your pain at its worst? evening and night Sleep (in general) Fair  Pain is worse with: walking, bending, sitting and standing Pain improves with: rest, heat/ice, therapy/exercise, pacing activities and medication Relief from Meds: 7  Mobility use a cane ability to climb steps?  no do you drive?  yes  Function Do you have any goals in this area?  no  Neuro/Psych trouble walking spasms dizziness confusion anxiety  Prior Studies Any changes since last visit?  no  Physicians involved in your care Any changes since last visit?  no   Family  History  Problem Relation Age of Onset  . Hypertension Mother   . Diabetes Father   . Hypertension Father    Social History   Socioeconomic History  . Marital status: Single    Spouse name: Not on file  . Number of children: Not on file  . Years of education: Not on file  . Highest education level: Not on file  Occupational History  . Not on file  Tobacco Use  . Smoking status: Never Smoker  . Smokeless tobacco: Never Used  Substance and Sexual Activity  . Alcohol use: No  . Drug use: Not on file  . Sexual activity: Not on file  Other Topics Concern  . Not on file  Social History Narrative  . Not on file   Social Determinants of Health   Financial Resource Strain:   . Difficulty of Paying Living Expenses: Not on file  Food Insecurity:   . Worried About Programme researcher, broadcasting/film/video in the Last Year: Not on file  . Ran Out of Food in the Last Year: Not on file  Transportation Needs:   . Lack of Transportation (Medical): Not on file  . Lack of Transportation (Non-Medical): Not on file  Physical Activity:   . Days of Exercise per Week: Not on file  . Minutes of Exercise per Session: Not on file  Stress:   . Feeling of Stress : Not on file  Social Connections:   . Frequency of Communication with Friends and Family: Not on file  . Frequency of Social Gatherings with Friends and  Family: Not on file  . Attends Religious Services: Not on file  . Active Member of Clubs or Organizations: Not on file  . Attends Banker Meetings: Not on file  . Marital Status: Not on file   Past Surgical History:  Procedure Laterality Date  . CESAREAN SECTION     x2  . OVARIAN CYST REMOVAL     Past Medical History:  Diagnosis Date  . Chronic back pain   . GAD (generalized anxiety disorder)   . GERD (gastroesophageal reflux disease)   . Gestational diabetes   . Hypertension   . Neuromuscular disorder (HCC) 2011   MS  . Presence of permanent cardiac pacemaker   . Seizures (HCC)     BP 138/84   Pulse 73   Temp (!) 97.5 F (36.4 C)   Ht 5\' 2"  (1.575 m)   Wt 282 lb (127.9 kg)   SpO2 97%   BMI 51.58 kg/m   Opioid Risk Score:   Fall Risk Score:  `1  Depression screen PHQ 2/9  Depression screen [facility] 2/9 07/03/2018 08/06/2017 07/05/2017 06/07/2017 05/08/2017 03/09/2017 02/07/2017  Decreased Interest 1 1 1 1 1 1 1   Down, Depressed, Hopeless 1 1 1 1 1 1 1   PHQ - 2 Score 2 2 2 2 2 2 2   Altered sleeping - - - - 0 - -  Tired, decreased energy - - - - 1 - -  Change in appetite - - - - - - -  Feeling bad or failure about yourself  - - - - 1 - -  Trouble concentrating - - - - 0 - -  Moving slowly or fidgety/restless - - - - 0 - -  Suicidal thoughts - - - - 0 - -  PHQ-9 Score - - - - 4 - -    Review of Systems  Musculoskeletal: Positive for back pain and gait problem.  Neurological: Positive for dizziness.  Psychiatric/Behavioral: Positive for confusion. The patient is nervous/anxious.   All other systems reviewed and are negative.      Objective:   Physical Exam Vitals and nursing note reviewed.  Constitutional:      Appearance: Normal appearance. She is obese.  Cardiovascular:     Rate and Rhythm: Normal rate and regular rhythm.     Pulses: Normal pulses.     Heart sounds: Normal heart sounds.  Pulmonary:     Effort: Pulmonary effort is normal.     Breath sounds: Normal breath sounds.  Musculoskeletal:     Cervical back: Normal range of motion and neck supple.     Comments: Normal Muscle Bulk and Muscle Testing Reveals:  Upper Extremities: Full ROM and Muscle Strength 5/5 Bilateral AC Joint Tenderness Lumbar Paraspinal Tenderness: L-3-L-5 Lower Extremities: Full ROM and Muscle Strength 5/5 Bilateral Lower Extremities Flexion Produces Pain into her Bilateral Patella's Arises from Table Slowly Narrow Based Gait   Skin:    General: Skin is warm and dry.  Neurological:     Mental Status: She is alert and oriented to person, place, and time.  Psychiatric:          Mood and Affect: Mood normal.        Behavior: Behavior normal.           Assessment & Plan:  1.Multiple Sclerosis: Neurology Following: Dr. Shah.03/24/2019 2.Lumbar Spondylolisthesis/Low back pain/ Lumbar Radiculitis/ Neuropathic Pain: Continue Current exercise Regime. Continue using heat therapy. Continuecurrent medication regimen withGabapentin.03/24/2019. 3. Chronic Pain:Refilled:OxyCODONE 10/325  mg one tablet5 times dailyas need for pain#130.We will continuewithslow weaning withagoal to decrease to 4 times a day as needed for pain, she verbalizes understanding.03/24/2019. We will continue the opioid monitoring program, this consists of regular clinic visits, examinations, urine drug screen, pill counts as well use of [place] Controlled Substance reporting System. 4. Muscle Spasms: Continuecurrent medication regimen withTizanidine.03/24/2019 5. Depression/anxiety:Psychiatry Following Dr. Vena Rua Continue Monthly Counseling. Continue Current Medication Regime. Xanax .03/24/2019 5. Morbid obesity:Encouraged to continue with weight Loss and Healthy Living Regimen.03/24/2019 6. Insomnia:No complaintstoday.Continueto monitor.03/24/2019 7.BilateralGreater trochanter Bursitis:No complaints today. Continue Alternating Ice and Heat Therapy.03/24/2019.. 8. Polyarthralgia: Bilateral Hands: Continue current medication regimen. Continue to monitor.03/24/2019.  F/U in 1 Month

## 2019-04-24 ENCOUNTER — Encounter: Payer: Medicare Other | Attending: Physical Medicine & Rehabilitation | Admitting: Registered Nurse

## 2019-04-24 ENCOUNTER — Other Ambulatory Visit: Payer: Self-pay

## 2019-04-24 ENCOUNTER — Encounter: Payer: Self-pay | Admitting: Registered Nurse

## 2019-04-24 VITALS — BP 116/83 | HR 80 | Temp 97.4°F | Wt 284.3 lb

## 2019-04-24 DIAGNOSIS — M62838 Other muscle spasm: Secondary | ICD-10-CM

## 2019-04-24 DIAGNOSIS — M25511 Pain in right shoulder: Secondary | ICD-10-CM

## 2019-04-24 DIAGNOSIS — M792 Neuralgia and neuritis, unspecified: Secondary | ICD-10-CM | POA: Diagnosis present

## 2019-04-24 DIAGNOSIS — G8929 Other chronic pain: Secondary | ICD-10-CM

## 2019-04-24 DIAGNOSIS — M545 Low back pain: Secondary | ICD-10-CM | POA: Insufficient documentation

## 2019-04-24 DIAGNOSIS — Z5181 Encounter for therapeutic drug level monitoring: Secondary | ICD-10-CM

## 2019-04-24 DIAGNOSIS — M25512 Pain in left shoulder: Secondary | ICD-10-CM

## 2019-04-24 DIAGNOSIS — M4316 Spondylolisthesis, lumbar region: Secondary | ICD-10-CM

## 2019-04-24 DIAGNOSIS — G894 Chronic pain syndrome: Secondary | ICD-10-CM | POA: Diagnosis not present

## 2019-04-24 DIAGNOSIS — M5416 Radiculopathy, lumbar region: Secondary | ICD-10-CM

## 2019-04-24 DIAGNOSIS — G35 Multiple sclerosis: Secondary | ICD-10-CM

## 2019-04-24 DIAGNOSIS — Z79899 Other long term (current) drug therapy: Secondary | ICD-10-CM

## 2019-04-24 MED ORDER — OXYCODONE-ACETAMINOPHEN 10-325 MG PO TABS: 1.0000 | ORAL_TABLET | Freq: Every day | ORAL | 0 refills | Status: DC | PRN

## 2019-04-24 NOTE — Progress Notes (Signed)
Subjective:    Patient ID: Mariah Morris, female    DOB: April 08, 1970, 49 y.o.   MRN: [id number]  HPI: Mariah Morris is a 49 y.o. female who returns for follow up appointment for chronic pain and medication refill. She states her pain is located in her bilateral shoulders and lower back pain radiating into her billateral lower extremities. She rates her pain 6. Her current exercise regime is walking and performing stretching exercises.  [name] Morphine equivalent is 75.00  MME. She  is also prescribed Alprazolam by Lyn Henri Pa-C. .We have discussed the black box warning of using opioids and benzodiazepines. I highlighted the dangers of using these drugs together and discussed the adverse events including respiratory suppression, overdose, cognitive impairment and importance of compliance with current regimen. We will continue to monitor and adjust as indicated.   She is being closely monitored and under the care of her psychiatrist.    UDS ordered today.   Pain Inventory Average Pain 6 Pain Right Now 6 My pain is burning, tingling and aching  In the last 24 hours, has pain interfered with the following? General activity 5 Relation with others 5 Enjoyment of life 5 What TIME of day is your pain at its worst? evening Sleep (in general) Fair  Pain is worse with: walking, bending, sitting and standing Pain improves with: rest, heat/ice and medication Relief from Meds: 7  Mobility use a cane how many minutes can you walk? 15  Function disabled: date disabled 8 years  Neuro/Psych weakness  Prior Studies none  Physicians involved in your care none   Family History  Problem Relation Age of Onset  . Hypertension Mother   . Diabetes Father   . Hypertension Father    Social History   Socioeconomic History  . Marital status: Single    Spouse name: Not on file  . Number of children: Not on file  . Years of education: Not on file  . Highest education  level: Not on file  Occupational History  . Not on file  Tobacco Use  . Smoking status: Never Smoker  . Smokeless tobacco: Never Used  Substance and Sexual Activity  . Alcohol use: No  . Drug use: Not on file  . Sexual activity: Not on file  Other Topics Concern  . Not on file  Social History Narrative  . Not on file   Social Determinants of Health   Financial Resource Strain:   . Difficulty of Paying Living Expenses:   Food Insecurity:   . Worried About Charity fundraiser in the Last Year:   . Arboriculturist in the Last Year:   Transportation Needs:   . Film/video editor (Medical):   Marland Kitchen Lack of Transportation (Non-Medical):   Physical Activity:   . Days of Exercise per Week:   . Minutes of Exercise per Session:   Stress:   . Feeling of Stress :   Social Connections:   . Frequency of Communication with Friends and Family:   . Frequency of Social Gatherings with Friends and Family:   . Attends Religious Services:   . Active Member of Clubs or Organizations:   . Attends Archivist Meetings:   Marland Kitchen Marital Status:    Past Surgical History:  Procedure Laterality Date  . CESAREAN SECTION     x2  . OVARIAN CYST REMOVAL     Past Medical History:  Diagnosis Date  . Chronic back pain   .  GAD (generalized anxiety disorder)   . GERD (gastroesophageal reflux disease)   . Gestational diabetes   . Hypertension   . Neuromuscular disorder (HCC) 2011   MS  . Presence of permanent cardiac pacemaker   . Seizures (HCC)    There were no vitals taken for this visit.  Opioid Risk Score:   Fall Risk Score:  `1  Depression screen PHQ 2/9  Depression screen [facility] 2/9 07/03/2018 08/06/2017 07/05/2017 06/07/2017 05/08/2017 03/09/2017 02/07/2017  Decreased Interest 1 1 1 1 1 1 1   Down, Depressed, Hopeless 1 1 1 1 1 1 1   PHQ - 2 Score 2 2 2 2 2 2 2   Altered sleeping - - - - 0 - -  Tired, decreased energy - - - - 1 - -  Change in appetite - - - - - - -  Feeling bad or failure  about yourself  - - - - 1 - -  Trouble concentrating - - - - 0 - -  Moving slowly or fidgety/restless - - - - 0 - -  Suicidal thoughts - - - - 0 - -  PHQ-9 Score - - - - 4 - -     Review of Systems  All other systems reviewed and are negative.      Objective:   Physical Exam Vitals and nursing note reviewed.  Constitutional:      Appearance: Normal appearance.  Cardiovascular:     Rate and Rhythm: Normal rate and regular rhythm.     Pulses: Normal pulses.     Heart sounds: Normal heart sounds.  Pulmonary:     Effort: Pulmonary effort is normal.     Breath sounds: Normal breath sounds.  Musculoskeletal:     Cervical back: Normal range of motion and neck supple.     Comments: Normal Muscle Bulk and Muscle Testing Reveals: Upper Extremities: Full ROM and Muscle Strength 5/5 Bilateral AC Joint Tenderness Lumbar Paraspinal Tenderness: L-3- L-5 Lower Extremities: Full ROM and Muscle Strength 5/5 Bilateral Lower Extremities Flexion Produces Pain into her bilateral lower extremities Arises from Table slowly Narrow Based Gait   Skin:    General: Skin is warm and dry.  Neurological:     Mental Status: She is alert and oriented to person, place, and time.  Psychiatric:        Mood and Affect: Mood normal.        Behavior: Behavior normal.           Assessment & Plan:  1.Multiple Sclerosis: Neurology Following: Dr. Shah.04/24/2019 2.Lumbar Spondylolisthesis/Low back pain/ Lumbar Radiculitis/ Neuropathic Pain: Continue Current exercise Regime. Continue using heat therapy. Continuecurrent medication regimen withGabapentin.04/24/2019. 3. Chronic Pain:Refilled:OxyCODONE 10/325 mg one tablet5 times dailyas need for pain#130.We will continuewithslow weaning withagoal to decreaseto 4 times a day as needed for pain, she verbalizes understanding.04/24/2019. We will continue the opioid monitoring program, this consists of regular clinic visits, examinations, urine  drug screen, pill counts as well use of 04/26/2019 Controlled Substance reporting System. 4. Muscle Spasms: Continuecurrent medication regimen withTizanidine.04/24/2019 5. Depression/anxiety:Psychiatry Following Dr. 04/26/2019 Continue Monthly Counseling. Continue Current Medication Regime. Xanax .04/24/2019 5. Morbid obesity:Encouraged to continue with weight Loss and Healthy Living Regimen.04/24/2019 6. Insomnia:No complaintstoday.Continueto monitor.04/24/2019 7.BilateralGreater trochanter Bursitis:No complaints today.Continue Alternating Ice and Heat Therapy.04/24/2019.. 8. Polyarthralgia: Bilateral Hands: Continue current medication regimen. Continue to monitor.04/24/2019.  F/U in 1 Month  15  minutes of face to face patient care time was spent during this visit. All questions were encouraged  and answered.

## 2019-04-28 LAB — TOXASSURE SELECT,+ANTIDEPR,UR

## 2019-05-05 ENCOUNTER — Telehealth: Payer: Self-pay | Admitting: *Deleted

## 2019-05-05 NOTE — Telephone Encounter (Signed)
Urine drug screen for this encounter is consistent for prescribed medication 

## 2019-05-30 ENCOUNTER — Other Ambulatory Visit: Payer: Self-pay

## 2019-05-30 ENCOUNTER — Ambulatory Visit: Payer: Medicare Other | Admitting: Registered Nurse

## 2019-05-30 ENCOUNTER — Encounter: Payer: Self-pay | Admitting: Registered Nurse

## 2019-05-30 ENCOUNTER — Encounter: Payer: Medicare Other | Attending: Physical Medicine & Rehabilitation | Admitting: Registered Nurse

## 2019-05-30 VITALS — BP 116/78 | HR 70 | Temp 98.2°F | Ht 62.0 in | Wt 281.4 lb

## 2019-05-30 DIAGNOSIS — M25511 Pain in right shoulder: Secondary | ICD-10-CM | POA: Diagnosis not present

## 2019-05-30 DIAGNOSIS — G894 Chronic pain syndrome: Secondary | ICD-10-CM

## 2019-05-30 DIAGNOSIS — M25512 Pain in left shoulder: Secondary | ICD-10-CM

## 2019-05-30 DIAGNOSIS — Z5181 Encounter for therapeutic drug level monitoring: Secondary | ICD-10-CM | POA: Insufficient documentation

## 2019-05-30 DIAGNOSIS — Z79899 Other long term (current) drug therapy: Secondary | ICD-10-CM

## 2019-05-30 DIAGNOSIS — G35 Multiple sclerosis: Secondary | ICD-10-CM | POA: Diagnosis present

## 2019-05-30 DIAGNOSIS — M4316 Spondylolisthesis, lumbar region: Secondary | ICD-10-CM | POA: Insufficient documentation

## 2019-05-30 DIAGNOSIS — M545 Low back pain: Secondary | ICD-10-CM | POA: Diagnosis present

## 2019-05-30 DIAGNOSIS — M792 Neuralgia and neuritis, unspecified: Secondary | ICD-10-CM | POA: Diagnosis present

## 2019-05-30 DIAGNOSIS — M62838 Other muscle spasm: Secondary | ICD-10-CM | POA: Diagnosis not present

## 2019-05-30 DIAGNOSIS — M2550 Pain in unspecified joint: Secondary | ICD-10-CM

## 2019-05-30 DIAGNOSIS — G8929 Other chronic pain: Secondary | ICD-10-CM

## 2019-05-30 MED ORDER — OXYCODONE-ACETAMINOPHEN 10-325 MG PO TABS: 1.0000 | ORAL_TABLET | Freq: Four times a day (QID) | ORAL | 0 refills | Status: DC | PRN

## 2019-05-30 NOTE — Progress Notes (Signed)
Subjective:    Patient ID: Mariah Morris, female    DOB: [birth year], 49 y.o.   MRN: [id number]  HPI: Mariah Morris is a 49 y.o. female who returns for follow up appointment for chronic pain and medication refill. She states her pain is located in her bilateral shoulders and lower back pain. Also reports generalized joint pain. She rates her pain 6. Her  current exercise regime is walking, performing stretching exercises and riding her stationary bicycle for 5- 10 minutes two days a week.   Ms. [place] Morphine equivalent is 75.00 MME. She is also prescribed Alprazolam by Lyn Henri PA-C. We have discussed the black box warning of using opioids and benzodiazepines. I highlighted the dangers of using these drugs together and discussed the adverse events including respiratory suppression, overdose, cognitive impairment and importance of compliance with current regimen. We will continue to monitor and adjust as indicated.   She  is being closely monitored and under the care of her psychiatrist Dr. Vena Rua.   Last UDS was Performed on 04/24/2019, it was consistent.   Pain Inventory Average Pain 6 Pain Right Now 6 My pain is constant and aching  In the last 24 hours, has pain interfered with the following? General activity 6 Relation with others 5 Enjoyment of life 5 What TIME of day is your pain at its worst? evening and night Sleep (in general) Fair  Pain is worse with: bending, sitting and standing Pain improves with: rest, heat/ice, therapy/exercise and medication Relief from Meds: 7  Mobility use a cane ability to climb steps?  no do you drive?  yes  Function Do you have any goals in this area?  no  Neuro/Psych trouble walking dizziness confusion anxiety  Prior Studies Any changes since last visit?  no  Physicians involved in your care Any changes since last visit?  no   Family History  Problem Relation Age of Onset  . Hypertension Mother   . Diabetes  Father   . Hypertension Father    Social History   Socioeconomic History  . Marital status: Single    Spouse name: Not on file  . Number of children: Not on file  . Years of education: Not on file  . Highest education level: Not on file  Occupational History  . Not on file  Tobacco Use  . Smoking status: Never Smoker  . Smokeless tobacco: Never Used  Substance and Sexual Activity  . Alcohol use: No  . Drug use: Not on file  . Sexual activity: Not on file  Other Topics Concern  . Not on file  Social History Narrative  . Not on file   Social Determinants of Health   Financial Resource Strain:   . Difficulty of Paying Living Expenses:   Food Insecurity:   . Worried About Charity fundraiser in the Last Year:   . Arboriculturist in the Last Year:   Transportation Needs:   . Film/video editor (Medical):   Marland Kitchen Lack of Transportation (Non-Medical):   Physical Activity:   . Days of Exercise per Week:   . Minutes of Exercise per Session:   Stress:   . Feeling of Stress :   Social Connections:   . Frequency of Communication with Friends and Family:   . Frequency of Social Gatherings with Friends and Family:   . Attends Religious Services:   . Active Member of Clubs or Organizations:   . Attends Archivist Meetings:   .  Marital Status:    Past Surgical History:  Procedure Laterality Date  . CESAREAN SECTION     x2  . OVARIAN CYST REMOVAL     Past Medical History:  Diagnosis Date  . Chronic back pain   . GAD (generalized anxiety disorder)   . GERD (gastroesophageal reflux disease)   . Gestational diabetes   . Hypertension   . Neuromuscular disorder (HCC) 2011   MS  . Presence of permanent cardiac pacemaker   . Seizures (HCC)    BP 116/78   Pulse 70   Temp 98.2 F (36.8 C)   Ht 5\' 2"  (1.575 m)   Wt 281 lb 6.4 oz (127.6 kg)   SpO2 98%   BMI 51.47 kg/m   Opioid Risk Score:   Fall Risk Score:  `1  Depression screen PHQ 2/9  Depression  screen [facility] 2/9 07/03/2018 08/06/2017 07/05/2017 06/07/2017 05/08/2017 03/09/2017 02/07/2017  Decreased Interest 1 1 1 1 1 1 1   Down, Depressed, Hopeless 1 1 1 1 1 1 1   PHQ - 2 Score 2 2 2 2 2 2 2   Altered sleeping - - - - 0 - -  Tired, decreased energy - - - - 1 - -  Change in appetite - - - - - - -  Feeling bad or failure about yourself  - - - - 1 - -  Trouble concentrating - - - - 0 - -  Moving slowly or fidgety/restless - - - - 0 - -  Suicidal thoughts - - - - 0 - -  PHQ-9 Score - - - - 4 - -    Review of Systems  Musculoskeletal: Positive for gait problem.  Neurological:       Spasms  Psychiatric/Behavioral: Positive for confusion. The patient is nervous/anxious.   All other systems reviewed and are negative.      Objective:   Physical Exam Vitals and nursing note reviewed.  Constitutional:      Appearance: Normal appearance.  Cardiovascular:     Rate and Rhythm: Normal rate and regular rhythm.     Pulses: Normal pulses.     Heart sounds: Normal heart sounds.  Pulmonary:     Effort: Pulmonary effort is normal.     Breath sounds: Normal breath sounds.  Musculoskeletal:     Cervical back: Normal range of motion and neck supple.     Comments: Normal Muscle Bulk and Muscle Testing Reveals:  Upper Extremities: Full ROM and Muscle Strength 5/5 Bilateral AC Joint Tenderness Lumbar Paraspinal Tenderness: L-3-L-5 Lower Extremities: Full ROM and Muscle Strength 5/5 Arises from Table Slowly  Narrow Based Gait   Skin:    General: Skin is warm and dry.  Neurological:     Mental Status: She is alert and oriented to person, place, and time.  Psychiatric:        Mood and Affect: Mood normal.        Behavior: Behavior normal.           Assessment & Plan:  1.Multiple Sclerosis: Neurology Following: Dr. Shah.05/30/2019 2.Lumbar Spondylolisthesis/Low back pain/ Lumbar Radiculitis/ Neuropathic Pain: Continue Current exercise Regime. Continue using heat therapy. Continuecurrent  medication regimen withGabapentin.05/30/2019. 3. Chronic Pain:Refilled:OxyCODONE 10/325 mg one tablet4 times dailyas need for pain#120.We will continue to monitor. 05/30/2019.  We will continue the opioid monitoring program, this consists of regular clinic visits, examinations, urine drug screen, pill counts as well use of Controlled Substance reporting System. 4. Muscle Spasms: Continuecurrent medication  regimen withTizanidine.05/30/2019 5. Depression/anxiety:Psychiatry Following Dr. Percell Belt Continue Monthly Counseling. Continue Current Medication Regime. Xanax .05/30/2019 5. Morbid obesity:Encouraged to continue with weight Loss and Healthy Living Regimen.05/30/2019 6. Insomnia:No complaintstoday.Continueto monitor.04/24/2019 7.BilateralGreater trochanter Bursitis:No complaints today.Continue Alternating Ice and Heat Therapy.05/30/2019.. 8. Polyarthralgia: Bilateral Hands: Continue current medication regimen. Continue to monitor.05/30/2019.  F/U in 1 Month  15  minutes of face to face patient care time was spent during this visit. All questions were encouraged and answered.

## 2019-06-23 ENCOUNTER — Other Ambulatory Visit: Payer: Self-pay

## 2019-06-23 ENCOUNTER — Encounter: Payer: Self-pay | Admitting: Registered Nurse

## 2019-06-23 ENCOUNTER — Encounter: Payer: Medicare Other | Attending: Physical Medicine & Rehabilitation | Admitting: Registered Nurse

## 2019-06-23 VITALS — BP 136/86 | HR 80 | Temp 97.7°F | Ht 62.0 in | Wt 287.0 lb

## 2019-06-23 DIAGNOSIS — Z5181 Encounter for therapeutic drug level monitoring: Secondary | ICD-10-CM | POA: Insufficient documentation

## 2019-06-23 DIAGNOSIS — Z79899 Other long term (current) drug therapy: Secondary | ICD-10-CM | POA: Insufficient documentation

## 2019-06-23 DIAGNOSIS — M792 Neuralgia and neuritis, unspecified: Secondary | ICD-10-CM | POA: Diagnosis present

## 2019-06-23 DIAGNOSIS — M2550 Pain in unspecified joint: Secondary | ICD-10-CM

## 2019-06-23 DIAGNOSIS — M545 Low back pain: Secondary | ICD-10-CM | POA: Insufficient documentation

## 2019-06-23 DIAGNOSIS — G35 Multiple sclerosis: Secondary | ICD-10-CM

## 2019-06-23 DIAGNOSIS — M62838 Other muscle spasm: Secondary | ICD-10-CM

## 2019-06-23 DIAGNOSIS — M4316 Spondylolisthesis, lumbar region: Secondary | ICD-10-CM | POA: Insufficient documentation

## 2019-06-23 DIAGNOSIS — M5416 Radiculopathy, lumbar region: Secondary | ICD-10-CM | POA: Diagnosis not present

## 2019-06-23 DIAGNOSIS — G894 Chronic pain syndrome: Secondary | ICD-10-CM | POA: Insufficient documentation

## 2019-06-23 MED ORDER — OXYCODONE-ACETAMINOPHEN 10-325 MG PO TABS: 1.0000 | ORAL_TABLET | Freq: Four times a day (QID) | ORAL | 0 refills | Status: DC | PRN

## 2019-06-23 NOTE — Progress Notes (Signed)
Subjective:    Patient ID: Mariah Morris, female    DOB: 09/18/1970, 49 y.o.   MRN: [id number]  HPI: Mariah Morris is a 49 y.o. female who returns for follow up appointment for chronic pain and medication refill. She states her pain is located in her lower back radiating into her bilateral hips and bilateral lower extremities. Also reports generalized joint pain.  She rates her pain 6. Her current exercise regime is walking, she was encouraged to increase HEP as tolerated. She verbalizes understanding.   Mariah Morris Morphine equivalent is 60.00 MME.  She  is also prescribed Alprazolam by Lyn Henri Pa-C.We have discussed the black box warning of using opioids and benzodiazepines. I highlighted the dangers of using these drugs together and discussed the adverse events including respiratory suppression, overdose, cognitive impairment and importance of compliance with current regimen. We will continue to monitor and adjust as indicated.  She  is being closely monitored and under the care of her psychiatrist.   Last UDS was Performed on 04/24/2019, it was consistent.    Pain Inventory Average Pain 6 Pain Right Now 6 My pain is constant and aching  In the last 24 hours, has pain interfered with the following? General activity 5 Relation with others 5 Enjoyment of life 5 What TIME of day is your pain at its worst? evening, night Sleep (in general) Fair  Pain is worse with: walking, bending, sitting and standing Pain improves with: rest, heat/ice and medication Relief from Meds: 7  Mobility walk without assistance walk with assistance use a cane Do you have any goals in this area?  no  Function Do you have any goals in this area?  no  Neuro/Psych trouble walking spasms dizziness confusion anxiety  Prior Studies Any changes since last visit?  no  Physicians involved in your care Any changes since last visit?  no   Family History  Problem Relation Age of  Onset  . Hypertension Mother   . Diabetes Father   . Hypertension Father    Social History   Socioeconomic History  . Marital status: Single    Spouse name: Not on file  . Number of children: Not on file  . Years of education: Not on file  . Highest education level: Not on file  Occupational History  . Not on file  Tobacco Use  . Smoking status: Never Smoker  . Smokeless tobacco: Never Used  Substance and Sexual Activity  . Alcohol use: No  . Drug use: Not on file  . Sexual activity: Not on file  Other Topics Concern  . Not on file  Social History Narrative  . Not on file   Social Determinants of Health   Financial Resource Strain:   . Difficulty of Paying Living Expenses:   Food Insecurity:   . Worried About Charity fundraiser in the Last Year:   . Arboriculturist in the Last Year:   Transportation Needs:   . Film/video editor (Medical):   Marland Kitchen Lack of Transportation (Non-Medical):   Physical Activity:   . Days of Exercise per Week:   . Minutes of Exercise per Session:   Stress:   . Feeling of Stress :   Social Connections:   . Frequency of Communication with Friends and Family:   . Frequency of Social Gatherings with Friends and Family:   . Attends Religious Services:   . Active Member of Clubs or Organizations:   . Attends  Club or Organization Meetings:   Marland Kitchen Marital Status:    Past Surgical History:  Procedure Laterality Date  . CESAREAN SECTION     x2  . OVARIAN CYST REMOVAL     Past Medical History:  Diagnosis Date  . Chronic back pain   . GAD (generalized anxiety disorder)   . GERD (gastroesophageal reflux disease)   . Gestational diabetes   . Hypertension   . Neuromuscular disorder (HCC) 2011   MS  . Presence of permanent cardiac pacemaker   . Seizures (HCC)    There were no vitals taken for this visit.  Opioid Risk Score:   Fall Risk Score:  `1  Depression screen PHQ 2/9  Depression screen [facility] 2/9 07/03/2018 08/06/2017 07/05/2017 06/07/2017  05/08/2017 03/09/2017 02/07/2017  Decreased Interest 1 1 1 1 1 1 1   Down, Depressed, Hopeless 1 1 1 1 1 1 1   PHQ - 2 Score 2 2 2 2 2 2 2   Altered sleeping - - - - 0 - -  Tired, decreased energy - - - - 1 - -  Change in appetite - - - - - - -  Feeling bad or failure about yourself  - - - - 1 - -  Trouble concentrating - - - - 0 - -  Moving slowly or fidgety/restless - - - - 0 - -  Suicidal thoughts - - - - 0 - -  PHQ-9 Score - - - - 4 - -    Review of Systems  Constitutional: Negative.   HENT: Negative.   Eyes: Negative.   Respiratory: Negative.   Cardiovascular: Negative.   Gastrointestinal: Negative.   Endocrine: Negative.   Genitourinary: Negative.   Musculoskeletal: Positive for arthralgias, back pain and gait problem.       Spasms   Skin: Negative.   Allergic/Immunologic: Negative.   Neurological: Positive for dizziness.  Hematological: Negative.   Psychiatric/Behavioral: Positive for confusion. The patient is nervous/anxious.   All other systems reviewed and are negative.      Objective:   Physical Exam Vitals and nursing note reviewed.  Constitutional:      Appearance: Normal appearance.  Cardiovascular:     Rate and Rhythm: Normal rate and regular rhythm.     Pulses: Normal pulses.     Heart sounds: Normal heart sounds.  Pulmonary:     Effort: Pulmonary effort is normal.     Breath sounds: Normal breath sounds.  Musculoskeletal:     Cervical back: Normal range of motion and neck supple.     Comments: Normal Muscle Bulk and Muscle Testing Reveals:  Upper Extremities: Full ROM and Muscle Strength 5/5 Thoracic Paraspinal Tenderness: T-3-T-7   Lumbar Paraspinal Tenderness: L-4-L-5 Bilateral Greater Trochanter Tenderness Lower Extremities: Full ROM and Muscle Strength 5/5 Arises from Table Slowly  Antalgic  Gait   Skin:    General: Skin is warm and dry.  Neurological:     Mental Status: She is alert and oriented to person, place, and time.  Psychiatric:         Mood and Affect: Mood normal.        Behavior: Behavior normal.           Assessment & Plan:  1.Multiple Sclerosis: Neurology Following: Dr. Shah.06/23/2019 2.Lumbar Spondylolisthesis/Low back pain/ Lumbar Radiculitis/ Neuropathic Pain: Continue Current exercise Regime. Continue using heat therapy. Continuecurrent medication regimen withGabapentin.06/23/2019. 3. Chronic Pain:Refilled:OxyCODONE 10/325 mg one tablet4 times dailyas need for pain#120.We will continue to monitor. 06/23/2019.  We will continue the opioid monitoring program, this consists of regular clinic visits, examinations, urine drug screen, pill counts as well use of [place] Controlled Substance reporting System. 4. Muscle Spasms: Continuecurrent medication regimen withTizanidine.06/23/2019 5. Depression/anxiety:Psychiatry Following Dr. Percell Belt Continue Monthly Counseling. Continue Current Medication Regime. Xanax .06/23/2019 6. Morbid obesity:Encouraged to continue with weight Loss and Healthy Living Regimen.06/23/2019 7. Insomnia:No complaintstoday.Continueto monitor.06/23/2019 8.BilateralGreater trochanter Bursitis:Continue Alternating Ice and Heat Therapy.06/23/2019.. 9. Polyarthralgia: Bilateral Hands: Continue current medication regimen. Continue to monitor.06/23/2019.  F/U in 1 Month  of face to face patient care time was spent during this visit. All questions were encouraged and answered.

## 2019-07-28 ENCOUNTER — Ambulatory Visit: Payer: Medicare Other | Admitting: Registered Nurse

## 2019-07-29 ENCOUNTER — Encounter: Payer: Medicare Other | Attending: Physical Medicine & Rehabilitation | Admitting: Registered Nurse

## 2019-07-29 ENCOUNTER — Other Ambulatory Visit: Payer: Self-pay

## 2019-07-29 ENCOUNTER — Encounter: Payer: Self-pay | Admitting: Registered Nurse

## 2019-07-29 VITALS — BP 114/71 | HR 72 | Temp 98.5°F | Ht 62.0 in | Wt 285.2 lb

## 2019-07-29 DIAGNOSIS — Z79899 Other long term (current) drug therapy: Secondary | ICD-10-CM | POA: Insufficient documentation

## 2019-07-29 DIAGNOSIS — M4316 Spondylolisthesis, lumbar region: Secondary | ICD-10-CM

## 2019-07-29 DIAGNOSIS — Z5181 Encounter for therapeutic drug level monitoring: Secondary | ICD-10-CM

## 2019-07-29 DIAGNOSIS — M542 Cervicalgia: Secondary | ICD-10-CM | POA: Diagnosis not present

## 2019-07-29 DIAGNOSIS — M62838 Other muscle spasm: Secondary | ICD-10-CM

## 2019-07-29 DIAGNOSIS — G8929 Other chronic pain: Secondary | ICD-10-CM

## 2019-07-29 DIAGNOSIS — M545 Low back pain: Secondary | ICD-10-CM | POA: Insufficient documentation

## 2019-07-29 DIAGNOSIS — M2550 Pain in unspecified joint: Secondary | ICD-10-CM

## 2019-07-29 DIAGNOSIS — G894 Chronic pain syndrome: Secondary | ICD-10-CM | POA: Insufficient documentation

## 2019-07-29 DIAGNOSIS — Z79891 Long term (current) use of opiate analgesic: Secondary | ICD-10-CM | POA: Diagnosis present

## 2019-07-29 DIAGNOSIS — M25512 Pain in left shoulder: Secondary | ICD-10-CM

## 2019-07-29 DIAGNOSIS — G35 Multiple sclerosis: Secondary | ICD-10-CM | POA: Insufficient documentation

## 2019-07-29 DIAGNOSIS — M792 Neuralgia and neuritis, unspecified: Secondary | ICD-10-CM | POA: Insufficient documentation

## 2019-07-29 DIAGNOSIS — M25511 Pain in right shoulder: Secondary | ICD-10-CM

## 2019-07-29 MED ORDER — OXYCODONE-ACETAMINOPHEN 10-325 MG PO TABS: 1.0000 | ORAL_TABLET | Freq: Four times a day (QID) | ORAL | 0 refills | Status: DC | PRN

## 2019-07-29 NOTE — Progress Notes (Signed)
Subjective:    Patient ID: Mariah Morris, female    DOB: 07-26-1970, 49 y.o.   MRN: [id number]  HPI: Mariah Morris is a 49 y.o. female who returns for follow up appointment for chronic pain and medication refill. She states her pain is located in her neck radiating into her bilateral shoulders and lower back pain. Also reports generalized joint pain. She rates her pain 6. Her  current exercise regime is walking and performing stretching exercises.  [name] Morphine equivalent is 61.11 MME.She is also prescribed Alprazolam by [name]. We have discussed the black box warning of using opioids and benzodiazepines. I highlighted the dangers of using these drugs together and discussed the adverse events including respiratory suppression, overdose, cognitive impairment and importance of compliance with current regimen. We will continue to monitor and adjust as indicated.  she is being closely monitored and under the care of her psychiatrist at [facility].   UDS ordered today.   Pain Inventory Average Pain 6 Pain Right Now 6 My pain is constant, tingling and aching  In the last 24 hours, has pain interfered with the following? General activity 5 Relation with others 5 Enjoyment of life 5 What TIME of day is your pain at its worst? evening night Sleep (in general) Fair  Pain is worse with: bending and sitting Pain improves with: rest, heat/ice, therapy/exercise and medication Relief from Meds: 7  Mobility how many minutes can you walk? 30 ability to climb steps?  yes do you drive?  yes  Function disabled: date disabled .  Neuro/Psych trouble walking spasms dizziness confusion anxiety  Prior Studies Any changes since last visit?  no  Physicians involved in your care Any changes since last visit?  no   Family History  Problem Relation Age of Onset  . Hypertension Mother   . Diabetes Father   . Hypertension Father    Social History    Socioeconomic History  . Marital status: Single    Spouse name: Not on file  . Number of children: Not on file  . Years of education: Not on file  . Highest education level: Not on file  Occupational History  . Not on file  Tobacco Use  . Smoking status: Never Smoker  . Smokeless tobacco: Never Used  Substance and Sexual Activity  . Alcohol use: No  . Drug use: Not on file  . Sexual activity: Not on file  Other Topics Concern  . Not on file  Social History Narrative  . Not on file   Social Determinants of Health   Financial Resource Strain:   . Difficulty of Paying Living Expenses:   Food Insecurity:   . Worried About Programme researcher, broadcasting/film/video in the Last Year:   . Barista in the Last Year:   Transportation Needs:   . Freight forwarder (Medical):   Marland Kitchen Lack of Transportation (Non-Medical):   Physical Activity:   . Days of Exercise per Week:   . Minutes of Exercise per Session:   Stress:   . Feeling of Stress :   Social Connections:   . Frequency of Communication with Friends and Family:   . Frequency of Social Gatherings with Friends and Family:   . Attends Religious Services:   . Active Member of Clubs or Organizations:   . Attends Banker Meetings:   Marland Kitchen Marital Status:    Past Surgical History:  Procedure Laterality Date  . CESAREAN SECTION  x2  . OVARIAN CYST REMOVAL     Past Medical History:  Diagnosis Date  . Chronic back pain   . GAD (generalized anxiety disorder)   . GERD (gastroesophageal reflux disease)   . Gestational diabetes   . Hypertension   . Neuromuscular disorder (HCC) 2011   MS  . Presence of permanent cardiac pacemaker   . Seizures (HCC)    BP 114/71   Pulse 72   Temp 98.5 F (36.9 C)   Ht 5\' 2"  (1.575 m)   Wt 285 lb 3.2 oz (129.4 kg)   SpO2 96%   BMI 52.16 kg/m   Opioid Risk Score:   Fall Risk Score:  `1  Depression screen PHQ 2/9  Depression screen [facility] 2/9 07/29/2019 07/03/2018 08/06/2017 07/05/2017  06/07/2017 05/08/2017 03/09/2017  Decreased Interest 1 1 1 1 1 1 1   Down, Depressed, Hopeless 1 1 1 1 1 1 1   PHQ - 2 Score 2 2 2 2 2 2 2   Altered sleeping - - - - - 0 -  Tired, decreased energy - - - - - 1 -  Change in appetite - - - - - - -  Feeling bad or failure about yourself  - - - - - 1 -  Trouble concentrating - - - - - 0 -  Moving slowly or fidgety/restless - - - - - 0 -  Suicidal thoughts - - - - - 0 -  PHQ-9 Score - - - - - 4 -    Review of Systems  Musculoskeletal: Positive for gait problem.  Psychiatric/Behavioral: Positive for confusion. The patient is nervous/anxious.   All other systems reviewed and are negative.      Objective:   Physical Exam Vitals and nursing note reviewed.  Constitutional:      Appearance: Normal appearance.  Cardiovascular:     Rate and Rhythm: Normal rate and regular rhythm.     Pulses: Normal pulses.     Heart sounds: Normal heart sounds.  Pulmonary:     Effort: Pulmonary effort is normal.     Breath sounds: Normal breath sounds.  Musculoskeletal:     Cervical back: Normal range of motion and neck supple.     Comments: Normal Muscle Bulk and Muscle Testing Reveals:  Upper Extremities: Full ROM and Muscle Strength 5/5 Bilateral AC Joint Tenderness  Lumbar Paraspinal Tenderness: L-3-L-5 Lower Extremities: Full ROM and Muscle Strength 5/5 Arises from Table slowly Antalgic  Gait   Skin:    General: Skin is warm and dry.  Neurological:     Mental Status: She is alert and oriented to person, place, and time.  Psychiatric:        Mood and Affect: Mood normal.        Behavior: Behavior normal.           Assessment & Plan:  1.Multiple Sclerosis: Neurology Following: Dr. Shah.07/29/2019 2.Lumbar Spondylolisthesis/Low back pain/ Lumbar Radiculitis/ Neuropathic Pain: Continue Current exercise Regime. Continue using heat therapy. Continuecurrent medication regimen withGabapentin.07/29/2019. 3. Chronic Pain:Continue with the slow  weaning. Refilled: OxyCODONE 10/325 mg one tablet4times dailyas need for pain#100.We willcontinue to monitor. 07/29/2019. We will continue the opioid monitoring program, this consists of regular clinic visits, examinations, urine drug screen, pill counts as well use of Controlled Substance reporting System. 4. Muscle Spasms: Continuecurrent medication regimen withTizanidine.07/29/2019 5. Depression/anxiety:Psychiatry Following Dr. 07/31/2019 Continue Monthly Counseling. Continue Current Medication Regime. Xanax .07/29/2019 6. Morbid obesity:Encouraged to continue with weight Loss and Healthy  Living Regimen.07/29/2019 7. Insomnia:No complaintstoday.Continueto monitor.07/29/2019 8.BilateralGreater trochanter Bursitis:No Complaints Today.Continue Alternating Ice and Heat Therapy.07/29/2019.. 9. Polyarthralgia: Bilateral Hands: Continue current medication regimen. Continue to monitor.07/29/2019.  F/U in 1 Month  of face to face patient care time was spent during this visit. All questions were encouraged and answered.

## 2019-08-05 LAB — TOXASSURE SELECT,+ANTIDEPR,UR

## 2019-08-08 ENCOUNTER — Telehealth: Payer: Self-pay | Admitting: *Deleted

## 2019-08-08 NOTE — Telephone Encounter (Signed)
Urine drug screen for this encounter is consistent for prescribed medication 

## 2019-08-27 ENCOUNTER — Encounter: Payer: Medicare Other | Attending: Physical Medicine & Rehabilitation | Admitting: Registered Nurse

## 2019-08-27 ENCOUNTER — Other Ambulatory Visit: Payer: Self-pay

## 2019-08-27 ENCOUNTER — Encounter: Payer: Self-pay | Admitting: Registered Nurse

## 2019-08-27 VITALS — BP 129/87 | HR 71 | Temp 98.6°F | Ht 62.0 in | Wt 281.4 lb

## 2019-08-27 DIAGNOSIS — G35 Multiple sclerosis: Secondary | ICD-10-CM | POA: Diagnosis present

## 2019-08-27 DIAGNOSIS — M4316 Spondylolisthesis, lumbar region: Secondary | ICD-10-CM | POA: Insufficient documentation

## 2019-08-27 DIAGNOSIS — G894 Chronic pain syndrome: Secondary | ICD-10-CM | POA: Insufficient documentation

## 2019-08-27 DIAGNOSIS — M792 Neuralgia and neuritis, unspecified: Secondary | ICD-10-CM | POA: Insufficient documentation

## 2019-08-27 DIAGNOSIS — Z79891 Long term (current) use of opiate analgesic: Secondary | ICD-10-CM | POA: Insufficient documentation

## 2019-08-27 DIAGNOSIS — Z5181 Encounter for therapeutic drug level monitoring: Secondary | ICD-10-CM | POA: Diagnosis present

## 2019-08-27 DIAGNOSIS — M5416 Radiculopathy, lumbar region: Secondary | ICD-10-CM | POA: Diagnosis not present

## 2019-08-27 DIAGNOSIS — Z79899 Other long term (current) drug therapy: Secondary | ICD-10-CM | POA: Diagnosis present

## 2019-08-27 DIAGNOSIS — G8929 Other chronic pain: Secondary | ICD-10-CM

## 2019-08-27 DIAGNOSIS — M25511 Pain in right shoulder: Secondary | ICD-10-CM | POA: Diagnosis not present

## 2019-08-27 DIAGNOSIS — M25512 Pain in left shoulder: Secondary | ICD-10-CM

## 2019-08-27 DIAGNOSIS — M7061 Trochanteric bursitis, right hip: Secondary | ICD-10-CM

## 2019-08-27 DIAGNOSIS — M545 Low back pain: Secondary | ICD-10-CM | POA: Insufficient documentation

## 2019-08-27 MED ORDER — OXYCODONE-ACETAMINOPHEN 10-325 MG PO TABS: 1.0000 | ORAL_TABLET | Freq: Four times a day (QID) | ORAL | 0 refills | Status: DC | PRN

## 2019-08-27 NOTE — Progress Notes (Signed)
Subjective:    Patient ID: Mariah Morris, female    DOB: July 08, 1970, 49 y.o.   MRN: [id number]  HPI: Mariah Morris is a 49 y.o. female who returns for follow up appointment for chronic pain and medication refill. She states her pain is located in her bilateral shoulders, lower back pain radiating into her right hip and bilateral lower extremities. She rates her pain 5. Her current exercise regime is walking and performing stretching exercises.  Mariah Morris Morphine equivalent is 60.00 MME.  She is also prescribed Alprazolam by [name]. We have discussed the black box warning of using opioids and benzodiazepines. I highlighted the dangers of using these drugs together and discussed the adverse events including respiratory suppression, overdose, cognitive impairment and importance of compliance with current regimen. We will continue to monitor and adjust as indicated.  She  is being closely monitored and under the care of her psychiatrist at [facility]   Pain Inventory Average Pain 5 Pain Right Now 5 My pain is constant, tingling and aching  In the last 24 hours, has pain interfered with the following? General activity 6 Relation with others 5 Enjoyment of life 5 What TIME of day is your pain at its worst? Evening & Night Sleep (in general) Good  Pain is worse with: walking, bending, sitting and standing Pain improves with: rest, heat/ice, therapy/exercise and medication Relief from Meds: 6  Mobility use a cane how many minutes can you walk? 5 mis ability to climb steps?  no do you drive?  yes Do you have any goals in this area?  yes  Function disabled: date disabled Maybe 9 years ago. I need assistance with the following:  dressing, bathing, toileting, meal prep, household duties and shopping Do you have any goals in this area?  yes  Neuro/Psych weakness numbness tremor tingling trouble walking spasms dizziness confusion anxiety  Prior  Studies Any changes since last visit?  no  Physicians involved in your care Any changes since last visit?  no   Family History  Problem Relation Age of Onset  . Hypertension Mother   . Diabetes Father   . Hypertension Father    Social History   Socioeconomic History  . Marital status: Single    Spouse name: Not on file  . Number of children: Not on file  . Years of education: Not on file  . Highest education level: Not on file  Occupational History  . Not on file  Tobacco Use  . Smoking status: Never Smoker  . Smokeless tobacco: Never Used  Substance and Sexual Activity  . Alcohol use: No  . Drug use: Not on file  . Sexual activity: Not on file  Other Topics Concern  . Not on file  Social History Narrative  . Not on file   Social Determinants of Health   Financial Resource Strain:   . Difficulty of Paying Living Expenses:   Food Insecurity:   . Worried About Programme researcher, broadcasting/film/video in the Last Year:   . Barista in the Last Year:   Transportation Needs:   . Freight forwarder (Medical):   Marland Kitchen Lack of Transportation (Non-Medical):   Physical Activity:   . Days of Exercise per Week:   . Minutes of Exercise per Session:   Stress:   . Feeling of Stress :   Social Connections:   . Frequency of Communication with Friends and Family:   . Frequency of Social Gatherings  with Friends and Family:   . Attends Religious Services:   . Active Member of Clubs or Organizations:   . Attends Banker Meetings:   Marland Kitchen Marital Status:    Past Surgical History:  Procedure Laterality Date  . CESAREAN SECTION     x2  . OVARIAN CYST REMOVAL     Past Medical History:  Diagnosis Date  . Chronic back pain   . GAD (generalized anxiety disorder)   . GERD (gastroesophageal reflux disease)   . Gestational diabetes   . Hypertension   . Neuromuscular disorder (HCC) 2011   MS  . Presence of permanent cardiac pacemaker   . Seizures (HCC)    There were no vitals  taken for this visit.  Opioid Risk Score:   Fall Risk Score:  `1  Depression screen PHQ 2/9  Depression screen [facility] 2/9 07/29/2019 07/03/2018 08/06/2017 07/05/2017 06/07/2017 05/08/2017 03/09/2017  Decreased Interest 1 1 1 1 1 1 1   Down, Depressed, Hopeless 1 1 1 1 1 1 1   PHQ - 2 Score 2 2 2 2 2 2 2   Altered sleeping - - - - - 0 -  Tired, decreased energy - - - - - 1 -  Change in appetite - - - - - - -  Feeling bad or failure about yourself  - - - - - 1 -  Trouble concentrating - - - - - 0 -  Moving slowly or fidgety/restless - - - - - 0 -  Suicidal thoughts - - - - - 0 -  PHQ-9 Score - - - - - 4 -   Review of Systems  Constitutional: Negative.   HENT: Negative.   Eyes: Negative.   Respiratory: Negative.   Cardiovascular: Negative.   Gastrointestinal: Negative.   Endocrine: Negative.   Genitourinary: Negative.   Musculoskeletal: Positive for back pain.  Skin: Negative.   Allergic/Immunologic: Negative.   Neurological: Positive for dizziness, weakness and numbness.  Psychiatric/Behavioral: Positive for confusion.       Objective:   Physical Exam Vitals and nursing note reviewed.  Constitutional:      Appearance: Normal appearance.  Cardiovascular:     Rate and Rhythm: Normal rate and regular rhythm.     Pulses: Normal pulses.     Heart sounds: Normal heart sounds.  Pulmonary:     Effort: Pulmonary effort is normal.     Breath sounds: Normal breath sounds.  Musculoskeletal:     Cervical back: Normal range of motion and neck supple.     Comments: Normal Muscle Bulk and Muscle Testing Reveals:  Upper Extremities: Full ROM and Muscle Strength 5/5 Bilateral AC Joint Tenderness: R>L Lumbar Hypersensitivity Right Greater Trochanter Tenderness Lower Extremities: Decreased ROM and Muscle Strength 5/5 Bilateral Lower Extremity Flexion Produces Pain into her Bilateral Patella's and Bilateral Lower Extremities.  Arises from Table slowly  Narrow Based  Gait   Skin:    General: Skin  is warm and dry.  Neurological:     Mental Status: She is alert and oriented to person, place, and time.  Psychiatric:        Mood and Affect: Mood normal.        Behavior: Behavior normal.           Assessment & Plan:  1.Multiple Sclerosis: Neurology Following: Dr. Shah.08/27/2019 2.Lumbar Spondylolisthesis/Low back pain/ Lumbar Radiculitis/ Neuropathic Pain: Continue Current exercise Regime. Continue using heat therapy. Continuecurrent medication regimen withGabapentin.08/27/2019. 3. Chronic Pain:Continue with the slow weaning. Refilled:  OxyCODONE 10/325 mg one tablet4times dailyas need for pain Decreased to #95We willcontinue to monitor. 08/27/2019. We will continue the opioid monitoring program, this consists of regular clinic visits, examinations, urine drug screen, pill counts as well use of [place] Controlled Substance reporting System. 4. Muscle Spasms: Continuecurrent medication regimen withTizanidine.08/27/2019 5. Depression/anxiety:Psychiatry Following Dr. Percell Belt Continue Monthly Counseling. Continue Current Medication Regime. Xanax .08/27/2019 6. Morbid obesity:Encouraged to continue with weight Loss and Healthy Living Regimen.08/27/2019 7. Insomnia:No complaintstoday.Continueto monitor.08/27/2019 8.Right Greater trochanter Bursitis:Continue Alternating Ice and Heat Therapy.Continue to monitor. 08/27/2019.. 9. Polyarthralgia: Bilateral Hands: Continue current medication regimen. Continue to monitor.08/27/2019.  F/U in 1 Month  of face to face patient care time was spent during this visit. All questions were encouraged and answered.

## 2019-08-28 ENCOUNTER — Encounter: Payer: Medicare Other | Admitting: Registered Nurse

## 2019-09-30 ENCOUNTER — Encounter: Payer: Medicare Other | Attending: Physical Medicine & Rehabilitation | Admitting: Registered Nurse

## 2019-09-30 ENCOUNTER — Encounter: Payer: Self-pay | Admitting: Registered Nurse

## 2019-09-30 ENCOUNTER — Other Ambulatory Visit: Payer: Self-pay

## 2019-09-30 VITALS — BP 116/82 | HR 79 | Temp 98.7°F | Ht 62.0 in | Wt 284.0 lb

## 2019-09-30 DIAGNOSIS — M545 Low back pain: Secondary | ICD-10-CM | POA: Insufficient documentation

## 2019-09-30 DIAGNOSIS — Z79891 Long term (current) use of opiate analgesic: Secondary | ICD-10-CM | POA: Insufficient documentation

## 2019-09-30 DIAGNOSIS — G894 Chronic pain syndrome: Secondary | ICD-10-CM | POA: Diagnosis not present

## 2019-09-30 DIAGNOSIS — M4316 Spondylolisthesis, lumbar region: Secondary | ICD-10-CM | POA: Diagnosis not present

## 2019-09-30 DIAGNOSIS — M792 Neuralgia and neuritis, unspecified: Secondary | ICD-10-CM | POA: Insufficient documentation

## 2019-09-30 DIAGNOSIS — G35 Multiple sclerosis: Secondary | ICD-10-CM | POA: Diagnosis present

## 2019-09-30 DIAGNOSIS — M5416 Radiculopathy, lumbar region: Secondary | ICD-10-CM | POA: Diagnosis not present

## 2019-09-30 DIAGNOSIS — Z5181 Encounter for therapeutic drug level monitoring: Secondary | ICD-10-CM | POA: Diagnosis present

## 2019-09-30 DIAGNOSIS — Z79899 Other long term (current) drug therapy: Secondary | ICD-10-CM | POA: Diagnosis present

## 2019-09-30 MED ORDER — OXYCODONE-ACETAMINOPHEN 10-325 MG PO TABS: 1.0000 | ORAL_TABLET | Freq: Four times a day (QID) | ORAL | 0 refills | Status: DC | PRN

## 2019-09-30 NOTE — Progress Notes (Signed)
Subjective:    Patient ID: Mariah Morris, female    DOB: 1971-01-10, 49 y.o.   MRN: [id number]  HPI: Mariah Morris is a 49 y.o. female who returns for follow up appointment for chronic pain and medication refill. She states her pain is located in her lower back radiating into her bilateral lower extremities. She rates her pain 7. Her current exercise regime is walking and performing stretching exercises.  Mariah Morris Morphine equivalent is 61.96 MME.  She is also prescribed Alprazolam by [name]. We have discussed the black box warning of using opioids and benzodiazepines. I highlighted the dangers of using these drugs together and discussed the adverse events including respiratory suppression, overdose, cognitive impairment and importance of compliance with current regimen. We will continue to monitor and adjust as indicated.  She is being closely monitored and under the care of her psychiatrist at [facility].    Last UDS was Performed on 07/29/2019, it was consistent.   Pain Inventory Average Pain 7 Pain Right Now 7 My pain is constant, burning, tingling and aching  In the last 24 hours, has pain interfered with the following? General activity 6 Relation with others 5 Enjoyment of life 5 What TIME of day is your pain at its worst? evening and night Sleep (in general) Fair  Pain is worse with: bending, inactivity and standing Pain improves with: rest, heat/ice, therapy/exercise and medication Relief from Meds: 7  Family History  Problem Relation Age of Onset  . Hypertension Mother   . Diabetes Father   . Hypertension Father    Social History   Socioeconomic History  . Marital status: Single    Spouse name: Not on file  . Number of children: Not on file  . Years of education: Not on file  . Highest education level: Not on file  Occupational History  . Not on file  Tobacco Use  . Smoking status: Never Smoker  . Smokeless tobacco: Never Used    Substance and Sexual Activity  . Alcohol use: No  . Drug use: Not on file  . Sexual activity: Not on file  Other Topics Concern  . Not on file  Social History Narrative  . Not on file   Social Determinants of Health   Financial Resource Strain:   . Difficulty of Paying Living Expenses: Not on file  Food Insecurity:   . Worried About Programme researcher, broadcasting/film/video in the Last Year: Not on file  . Ran Out of Food in the Last Year: Not on file  Transportation Needs:   . Lack of Transportation (Medical): Not on file  . Lack of Transportation (Non-Medical): Not on file  Physical Activity:   . Days of Exercise per Week: Not on file  . Minutes of Exercise per Session: Not on file  Stress:   . Feeling of Stress : Not on file  Social Connections:   . Frequency of Communication with Friends and Family: Not on file  . Frequency of Social Gatherings with Friends and Family: Not on file  . Attends Religious Services: Not on file  . Active Member of Clubs or Organizations: Not on file  . Attends Banker Meetings: Not on file  . Marital Status: Not on file   Past Surgical History:  Procedure Laterality Date  . CESAREAN SECTION     x2  . OVARIAN CYST REMOVAL     Past Surgical History:  Procedure Laterality Date  . CESAREAN SECTION  x2  . OVARIAN CYST REMOVAL     Past Medical History:  Diagnosis Date  . Chronic back pain   . GAD (generalized anxiety disorder)   . GERD (gastroesophageal reflux disease)   . Gestational diabetes   . Hypertension   . Neuromuscular disorder (HCC) 2011   MS  . Presence of permanent cardiac pacemaker   . Seizures (HCC)    BP 116/82   Pulse 79   Temp 98.7 F (37.1 C)   Ht 5\' 2"  (1.575 m)   Wt 284 lb (128.8 kg)   SpO2 98%   BMI 51.94 kg/m   Opioid Risk Score:   Fall Risk Score:  `1  Depression screen PHQ 2/9  Depression screen [facility] 2/9 08/27/2019 07/29/2019 07/03/2018 08/06/2017 07/05/2017 06/07/2017 05/08/2017  Decreased Interest 0 1 1 1 1 1  1   Down, Depressed, Hopeless 0 1 1 1 1 1 1   PHQ - 2 Score 0 2 2 2 2 2 2   Altered sleeping - - - - - - 0  Tired, decreased energy - - - - - - 1  Change in appetite - - - - - - -  Feeling bad or failure about yourself  - - - - - - 1  Trouble concentrating - - - - - - 0  Moving slowly or fidgety/restless - - - - - - 0  Suicidal thoughts - - - - - - 0  PHQ-9 Score - - - - - - 4    Review of Systems  Musculoskeletal: Positive for back pain.  Neurological: Positive for weakness and numbness.  All other systems reviewed and are negative.      Objective:   Physical Exam Vitals and nursing note reviewed.  Constitutional:      Appearance: Normal appearance.  Cardiovascular:     Rate and Rhythm: Normal rate and regular rhythm.     Pulses: Normal pulses.     Heart sounds: Normal heart sounds.  Pulmonary:     Effort: Pulmonary effort is normal.     Breath sounds: Normal breath sounds.  Musculoskeletal:     Cervical back: Normal range of motion and neck supple.     Comments: Normal Muscle Bulk and Muscle Testing Reveals:  Upper Extremities: Full ROM and Muscle Strength 5/5  Lumbar Paraspinal Tenderness: L-3-L-5 Lower Extremities: Full ROM and Muscle Strength 5/5 Arises from Table Slowly Narrow Based Gait   Skin:    General: Skin is warm and dry.  Neurological:     Mental Status: She is alert and oriented to person, place, and time.  Psychiatric:        Mood and Affect: Mood normal.        Behavior: Behavior normal.           Assessment & Plan:  1.Multiple Sclerosis: Neurology Following: Dr. Shah.09/30/2019 2.Lumbar Spondylolisthesis/Low back pain/ Lumbar Radiculitis/ Neuropathic Pain: Continue Current exercise Regime. Continue using heat therapy. Continuecurrent medication regimen withGabapentin.09/30/2019. 3. Chronic Pain:Continue with the slow weaning.Refilled: OxyCODONE 10/325 mg one tablet4times dailyas need for pain Decreased to #95We willcontinue to  monitor. 09/30/2019. We will continue the opioid monitoring program, this consists of regular clinic visits, examinations, urine drug screen, pill counts as well as use of Controlled Substance Reporting system. A 12 month History has been reviewed on the 10/02/2019 Controlled Substance Reporting System on 09/30/2019. 4. Muscle Spasms: Continuecurrent medication regimen withTizanidine.09/30/2019 5. Depression/anxiety:Psychiatry Following Dr. [place] Continue Monthly Counseling. Continue Current Medication Regime. Xanax .  09/30/2019 6. Morbid obesity:Encouraged to continue with weight Loss and Healthy Living Regimen.09/30/2019 7. Insomnia:No complaintstoday.Continueto monitor.09/30/2019 8.Right Greater trochanter Bursitis:No complaints today. Continue Alternating Ice and Heat Therapy.Continue to monitor. 09/30/2019.. 9. Polyarthralgia: Bilateral Hands: Continue current medication regimen. Continue to monitor.09/30/2019.  F/U in 1 Month  of face to face patient care time was spent during this visit. All questions were encouraged and answered.

## 2019-10-11 IMAGING — MR MRI HEAD WITHOUT AND WITH CONTRAST
15 series · 48 of 48 positions shown · IV contrast (agent unspecified)
Comparison: CT head 01/20/2010

CLINICAL DATA: Multiple sclerosis.

EXAM:
MRI HEAD WITHOUT AND WITH CONTRAST
TECHNIQUE: Multiplanar, multiecho pulse sequences of the brain and surrounding
structures were obtained without and with intravenous contrast.
CONTRAST:  10 mL Gadovist IV

[Series 5: ax dwi_tracew · axial · 3.0mm · 0.60mm/px · z∈[-114,+40]mm · 2 of 48 slices shown]
[im 1/48]
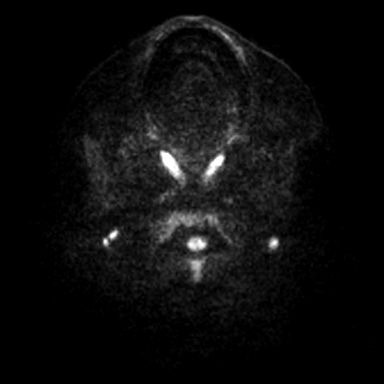
[im 48/48]
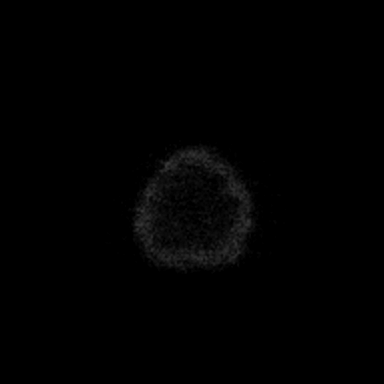

[Series 6: ax dwi_adc · axial · 3.0mm · 0.60mm/px · z∈[-114,+40]mm · 3 of 48 slices shown]
[im 1/48]
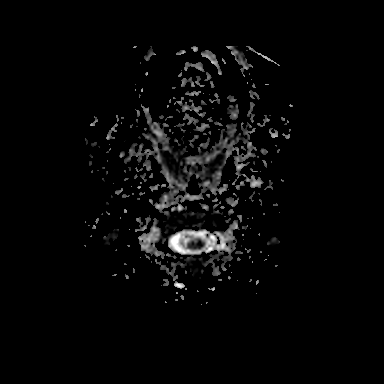
[im 24/48]
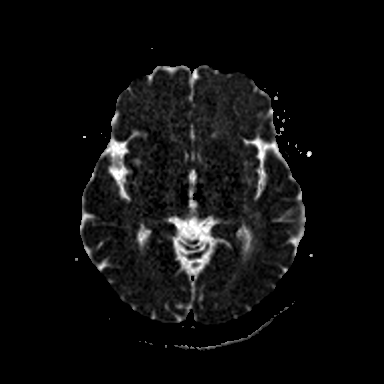
[im 48/48]
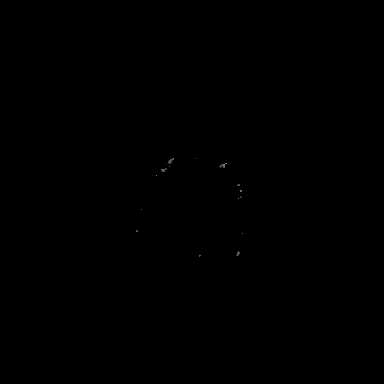

[Series 7: T1 · sagittal · 5.0mm · 0.62mm/px · 1 of 23 slices shown (1 of 2)]
[im 1/23]
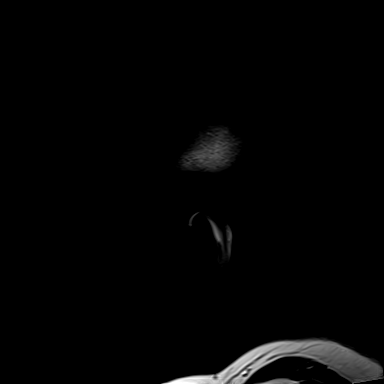

[Series 8: FLAIR · sagittal · 5.0mm · 0.94mm/px · 1 of 23 slices shown (1 of 2)]
[im 1/23]
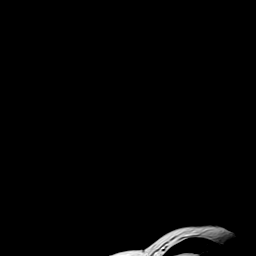

[Series 9: cor dwi_tracew · coronal · 5.0mm · 0.60mm/px · 2 of 38 slices shown]
[im 1/38]
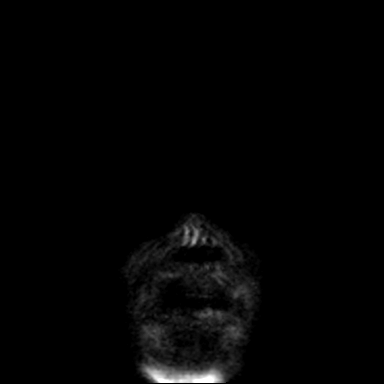
[im 38/38]
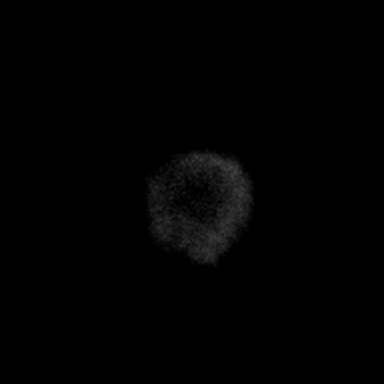

[Series 10: cor dwi_adc · coronal · 5.0mm · 0.60mm/px · 2 of 36 slices shown]
[im 1/36]
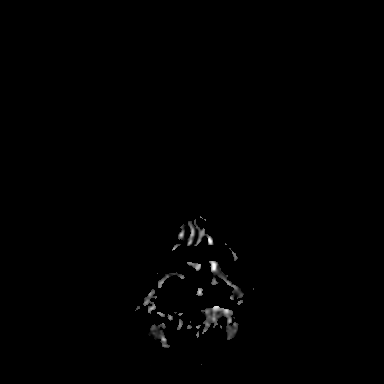
[im 36/36]
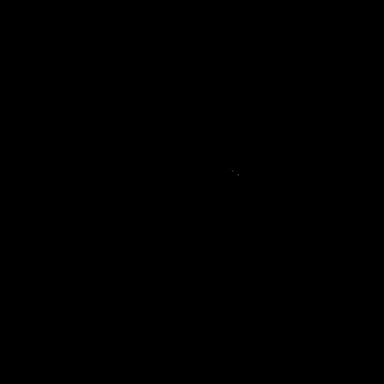

[Series 11: T2 · axial · 5.0mm · 0.53mm/px · 1 of 25 slices shown (1 of 2)]
[im 1/25]
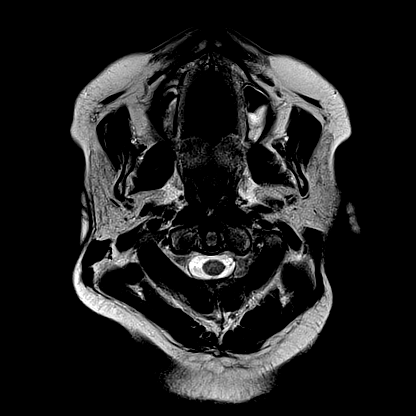

[Series 12: mag_images · axial · 3.0mm · 0.90mm/px · z∈[-126,+51]mm · 3 of 60 slices shown]
[im 1/60]
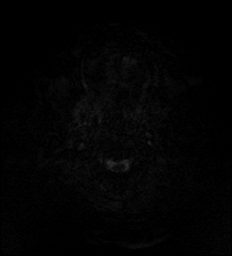
[im 30/60]
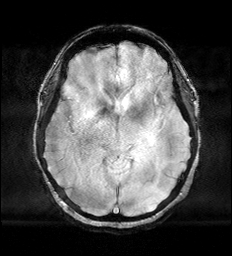
[im 60/60]
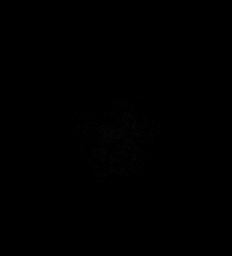

[Series 13: pha_images · axial · 3.0mm · 0.90mm/px · z∈[-126,+48]mm · 3 of 59 slices shown]
[im 1/59]
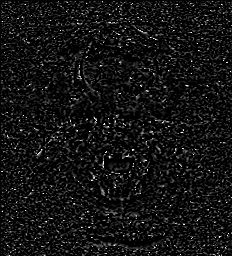
[im 30/59]
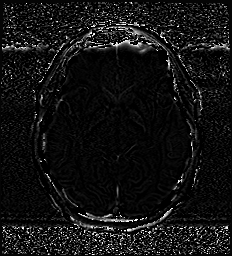
[im 59/59]
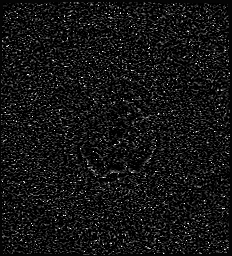

[Series 14: swi_images · axial · 3.0mm · 0.90mm/px · z∈[-126,+51]mm · 3 of 60 slices shown]
[im 1/60]
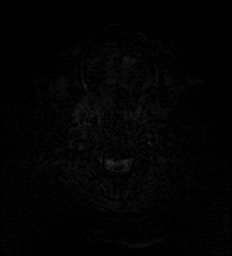
[im 30/60]
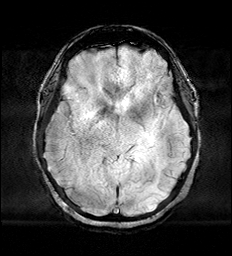
[im 60/60]
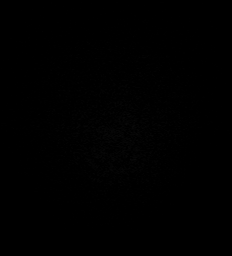

[Series 16: FLAIR · axial · 3.0mm · 0.53mm/px · z∈[-118,+43]mm · 3 of 55 slices shown (2 of 2)]
[im 1/55]
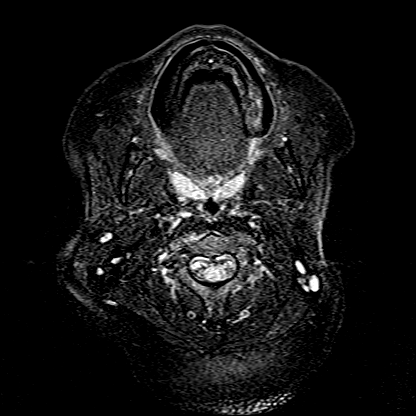
[im 28/55]
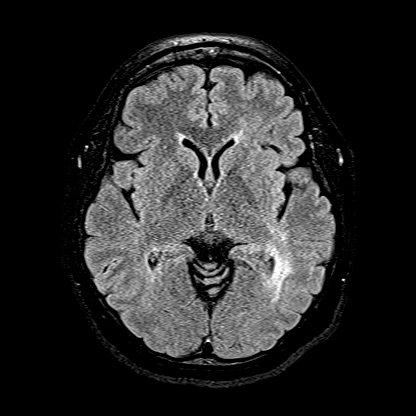
[im 55/55]
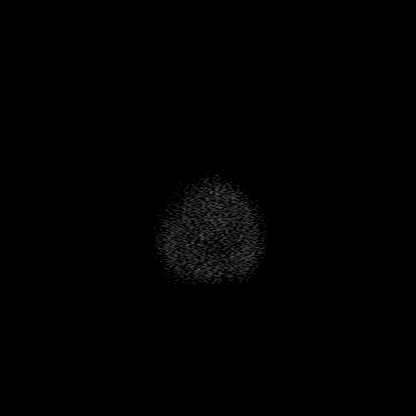

[Series 17: T1 · axial · 1.0mm · 0.98mm/px · z∈[-124,+51]mm · 10 of 176 slices shown (2 of 2)]
[im 1/176]
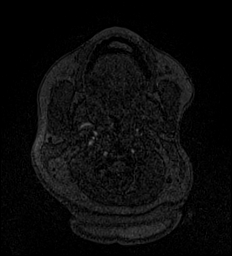
[im 20/176]
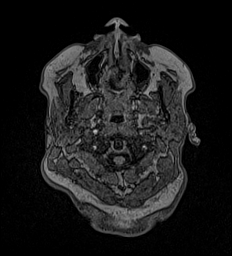
[im 39/176]
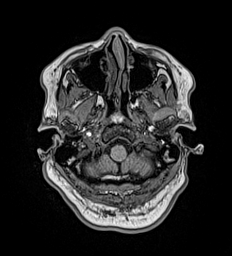
[im 59/176]
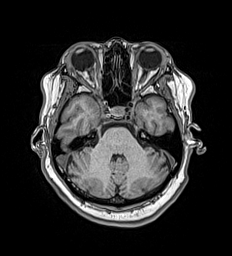
[im 78/176]
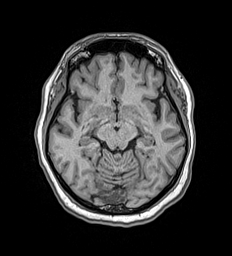
[im 98/176]
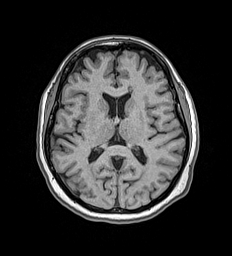
[im 117/176]
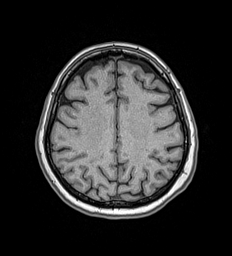
[im 137/176]
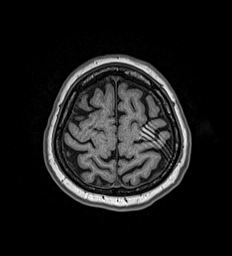
[im 156/176]
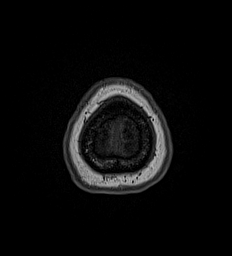
[im 176/176]
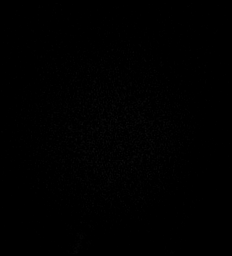

[Series 18: T2 · coronal · 5.0mm · 0.57mm/px · 2 of 29 slices shown (2 of 2)]
[im 1/29]
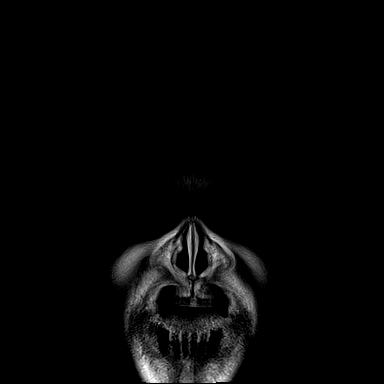
[im 29/29]
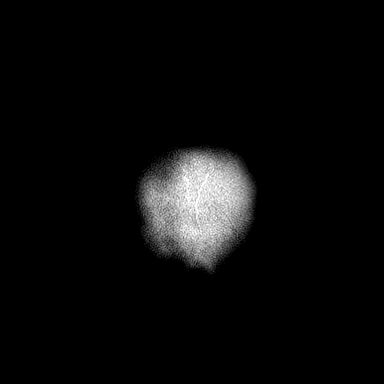

[Series 20: T1 post-contrast · axial · 1.0mm · 0.98mm/px · z∈[-124,+51]mm · 10 of 176 slices shown (1 of 2)]
[im 1/176]
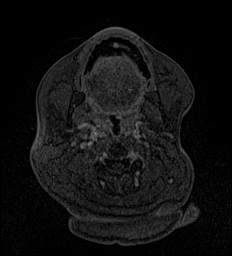
[im 20/176]
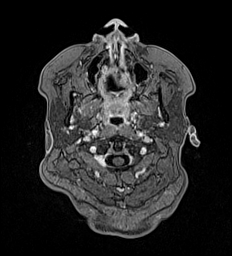
[im 39/176]
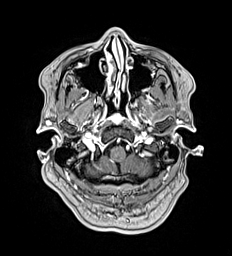
[im 59/176]
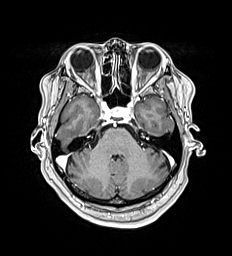
[im 78/176]
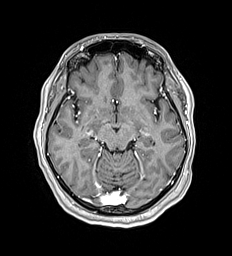
[im 98/176]
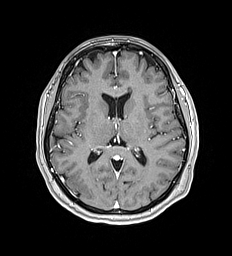
[im 117/176]
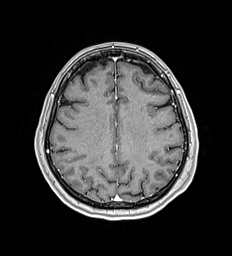
[im 137/176]
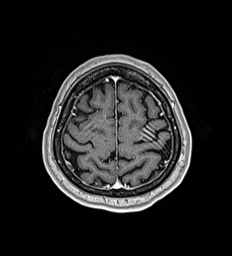
[im 156/176]
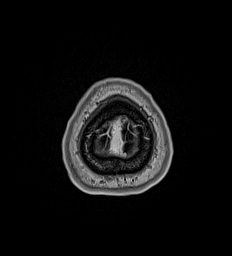
[im 176/176]
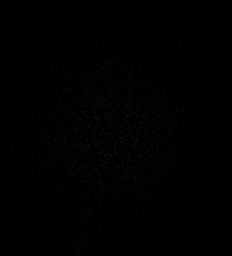

[Series 21: T1 post-contrast · coronal · 5.0mm · 0.57mm/px · 2 of 29 slices shown (2 of 2)]
[im 1/29]
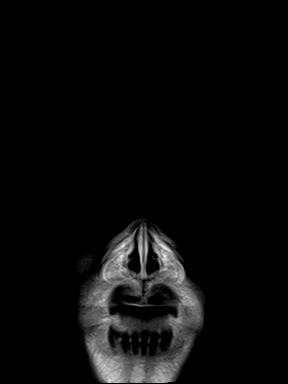
[im 29/29]
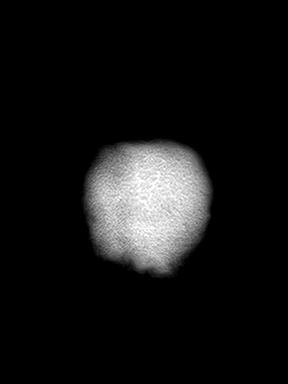

[48 of 48 positions shown; findings below may reference images not displayed]

FINDINGS: Brain: Multiple white matter lesions bilaterally. Periventricular
deep white matter lesions are present bilaterally in the cerebral
hemispheres. Lesion in the right brainstem. Moderate number of
lesions. No lesions show enhancement or restricted diffusion.

Ventricle size and cerebral volume normal. Negative for hemorrhage
or mass. Normal enhancement postcontrast administration.

Vascular: Normal arterial flow voids

Skull and upper cervical spine: Negative

Sinuses/Orbits: Mild mucosal edema paranasal sinuses.  Normal orbit

Other: None
IMPRESSION: Moderate changes of multiple sclerosis in the white matter and
brainstem. No lesions show acute demyelinization. Normal cerebral
volume.

## 2019-10-30 ENCOUNTER — Encounter: Payer: Self-pay | Admitting: Registered Nurse

## 2019-10-30 ENCOUNTER — Other Ambulatory Visit: Payer: Self-pay

## 2019-10-30 ENCOUNTER — Encounter: Payer: Medicare Other | Attending: Physical Medicine & Rehabilitation | Admitting: Registered Nurse

## 2019-10-30 VITALS — BP 117/82 | HR 79 | Temp 98.1°F | Ht 62.0 in | Wt 284.4 lb

## 2019-10-30 DIAGNOSIS — M4316 Spondylolisthesis, lumbar region: Secondary | ICD-10-CM | POA: Diagnosis not present

## 2019-10-30 DIAGNOSIS — Z79899 Other long term (current) drug therapy: Secondary | ICD-10-CM | POA: Insufficient documentation

## 2019-10-30 DIAGNOSIS — G35 Multiple sclerosis: Secondary | ICD-10-CM | POA: Diagnosis present

## 2019-10-30 DIAGNOSIS — Z5181 Encounter for therapeutic drug level monitoring: Secondary | ICD-10-CM | POA: Diagnosis not present

## 2019-10-30 DIAGNOSIS — G894 Chronic pain syndrome: Secondary | ICD-10-CM

## 2019-10-30 DIAGNOSIS — Z79891 Long term (current) use of opiate analgesic: Secondary | ICD-10-CM | POA: Diagnosis present

## 2019-10-30 DIAGNOSIS — M545 Low back pain: Secondary | ICD-10-CM | POA: Insufficient documentation

## 2019-10-30 DIAGNOSIS — M792 Neuralgia and neuritis, unspecified: Secondary | ICD-10-CM | POA: Insufficient documentation

## 2019-10-30 DIAGNOSIS — M5416 Radiculopathy, lumbar region: Secondary | ICD-10-CM

## 2019-10-30 MED ORDER — OXYCODONE-ACETAMINOPHEN 10-325 MG PO TABS: 1.0000 | ORAL_TABLET | Freq: Four times a day (QID) | ORAL | 0 refills | Status: DC | PRN

## 2019-10-30 NOTE — Progress Notes (Signed)
Subjective:    Patient ID: Mariah Morris, female    DOB: 03-09-1970, 49 y.o.   MRN: [id number]  HPI: Mariah Morris is a 49 y.o. female who returns for follow up appointment for chronic pain and medication refill. She states her  pain is located in her lower back and occasionally radiating into her right lower extremity she reports. She rates her pain 6. Her current exercise regime is walking and performing stretching exercises.  Ms. Ancheta Morphine equivalent is 61.96  MME. She is also prescribed  Alprazolam by [name] We have discussed the black box warning of using opioids and benzodiazepines. I highlighted the dangers of using these drugs together and discussed the adverse events including respiratory suppression, overdose, cognitive impairment and importance of compliance with current regimen. We will continue to monitor and adjust as indicated.  She  is being closely monitored and under the care of her psychiatrist at [facility]   Last UDS was Performed on 07/29/2019, it was consistent.   Pain Inventory Average Pain 6 Pain Right Now 6 My pain is constant, burning and aching  In the last 24 hours, has pain interfered with the following? General activity 5 Relation with others 5 Enjoyment of life 5 What TIME of day is your pain at its worst? evening and night Sleep (in general) Good  Pain is worse with: walking, bending, sitting and standing Pain improves with: rest, heat/ice, pacing activities and medication Relief from Meds: 7  Family History  Problem Relation Age of Onset  . Hypertension Mother   . Diabetes Father   . Hypertension Father    Social History   Socioeconomic History  . Marital status: Single    Spouse name: Not on file  . Number of children: Not on file  . Years of education: Not on file  . Highest education level: Not on file  Occupational History  . Not on file  Tobacco Use  . Smoking status: Never Smoker  . Smokeless  tobacco: Never Used  Substance and Sexual Activity  . Alcohol use: No  . Drug use: Not on file  . Sexual activity: Not on file  Other Topics Concern  . Not on file  Social History Narrative  . Not on file   Social Determinants of Health   Financial Resource Strain:   . Difficulty of Paying Living Expenses: Not on file  Food Insecurity:   . Worried About Programme researcher, broadcasting/film/video in the Last Year: Not on file  . Ran Out of Food in the Last Year: Not on file  Transportation Needs:   . Lack of Transportation (Medical): Not on file  . Lack of Transportation (Non-Medical): Not on file  Physical Activity:   . Days of Exercise per Week: Not on file  . Minutes of Exercise per Session: Not on file  Stress:   . Feeling of Stress : Not on file  Social Connections:   . Frequency of Communication with Friends and Family: Not on file  . Frequency of Social Gatherings with Friends and Family: Not on file  . Attends Religious Services: Not on file  . Active Member of Clubs or Organizations: Not on file  . Attends Banker Meetings: Not on file  . Marital Status: Not on file   Past Surgical History:  Procedure Laterality Date  . CESAREAN SECTION     x2  . OVARIAN CYST REMOVAL     Past Surgical History:  Procedure  Laterality Date  . CESAREAN SECTION     x2  . OVARIAN CYST REMOVAL     Past Medical History:  Diagnosis Date  . Chronic back pain   . GAD (generalized anxiety disorder)   . GERD (gastroesophageal reflux disease)   . Gestational diabetes   . Hypertension   . Neuromuscular disorder (HCC) 2011   MS  . Presence of permanent cardiac pacemaker   . Seizures (HCC)    BP 117/82   Pulse 79   Temp 98.1 F (36.7 C)   Ht 5\' 2"  (1.575 m)   Wt 284 lb 6.4 oz (129 kg)   SpO2 99%   BMI 52.02 kg/m   Opioid Risk Score:   Fall Risk Score:  `1  Depression screen PHQ 2/9  Depression screen [facility] 2/9 08/27/2019 07/29/2019 07/03/2018 08/06/2017 07/05/2017 06/07/2017 05/08/2017    Decreased Interest 0 1 1 1 1 1 1   Down, Depressed, Hopeless 0 1 1 1 1 1 1   PHQ - 2 Score 0 2 2 2 2 2 2   Altered sleeping - - - - - - 0  Tired, decreased energy - - - - - - 1  Change in appetite - - - - - - -  Feeling bad or failure about yourself  - - - - - - 1  Trouble concentrating - - - - - - 0  Moving slowly or fidgety/restless - - - - - - 0  Suicidal thoughts - - - - - - 0  PHQ-9 Score - - - - - - 4    Review of Systems  Musculoskeletal: Positive for back pain.  Neurological: Positive for weakness and numbness.  All other systems reviewed and are negative.      Objective:   Physical Exam Vitals and nursing note reviewed.  Constitutional:      Appearance: Normal appearance. She is obese.  Cardiovascular:     Rate and Rhythm: Normal rate and regular rhythm.     Pulses: Normal pulses.     Heart sounds: Normal heart sounds.  Pulmonary:     Effort: Pulmonary effort is normal.     Breath sounds: Normal breath sounds.  Musculoskeletal:     Cervical back: Normal range of motion and neck supple.     Comments: Normal Muscle Bulk and Muscle Testing Reveals:  Upper Extremities: Full ROM and Muscle Strength 5/5  Lumbar Paraspinal Tenderness: L-3-L-5 Lower Extremities: Full ROM and Muscle Strength 5/5 Arises from Table with ease Narrow Based  Gait   Skin:    General: Skin is warm and dry.  Neurological:     Mental Status: She is alert and oriented to person, place, and time.  Psychiatric:        Mood and Affect: Mood normal.        Behavior: Behavior normal.           Assessment & Plan:  1.Multiple Sclerosis: Neurology Following: Dr. Shah.10/30/2019 2.Lumbar Spondylolisthesis/Low back pain/ Lumbar Radiculitis/ Neuropathic Pain: Continue Current exercise Regime. Continue using heat therapy. Continuecurrent medication regimen withGabapentin.10/30/2019. 3. Chronic Pain:Continue with the slow weaning.Refilled: OxyCODONE 10/325 mg one tablet4times dailyas need  for pain#95We willcontinue to monitor. 10/30/2019. We will continue the opioid monitoring program, this consists of regular clinic visits, examinations, urine drug screen, pill counts as well as use of Controlled Substance Reporting system. A 12 month History has been reviewed on the 11/01/2019 Controlled Substance Reporting System on 10/30/2019. 4. Muscle Spasms: Continuecurrent medication  regimen withTizanidine.10/30/2019 5. Depression/anxiety:Psychiatry Following Dr. Percell Belt Continue Monthly Counseling. Continue Current Medication Regime. Xanax .10/30/2019 6. Morbid obesity:Encouraged to continue with weight Loss and Healthy Living Regimen.10/30/2019 7. Insomnia:No complaintstoday.Continueto monitor.10/30/2019 8.RightGreater trochanter Bursitis:No complaints today. Continue Alternating Ice and Heat Therapy.Continue to monitor.10/30/2019.. 9. Polyarthralgia: Bilateral Hands: No complaints today. Continue current medication regimen. Continue to monitor.10/30/2019.  F/U in 1 Month  of face to face patient care time was spent during this visit. All questions were encouraged and answered.

## 2019-11-28 ENCOUNTER — Other Ambulatory Visit: Payer: Self-pay

## 2019-11-28 ENCOUNTER — Encounter: Payer: Self-pay | Admitting: Registered Nurse

## 2019-11-28 ENCOUNTER — Encounter: Payer: Medicare Other | Attending: Physical Medicine & Rehabilitation | Admitting: Registered Nurse

## 2019-11-28 VITALS — BP 107/68 | HR 77 | Temp 98.7°F | Ht 62.0 in | Wt 289.0 lb

## 2019-11-28 DIAGNOSIS — G894 Chronic pain syndrome: Secondary | ICD-10-CM | POA: Diagnosis not present

## 2019-11-28 DIAGNOSIS — M4316 Spondylolisthesis, lumbar region: Secondary | ICD-10-CM | POA: Insufficient documentation

## 2019-11-28 DIAGNOSIS — M5416 Radiculopathy, lumbar region: Secondary | ICD-10-CM | POA: Diagnosis not present

## 2019-11-28 DIAGNOSIS — Z79891 Long term (current) use of opiate analgesic: Secondary | ICD-10-CM | POA: Insufficient documentation

## 2019-11-28 DIAGNOSIS — Z5181 Encounter for therapeutic drug level monitoring: Secondary | ICD-10-CM | POA: Diagnosis not present

## 2019-11-28 MED ORDER — OXYCODONE-ACETAMINOPHEN 10-325 MG PO TABS: 1.0000 | ORAL_TABLET | Freq: Four times a day (QID) | ORAL | 0 refills | Status: DC | PRN

## 2019-11-28 NOTE — Progress Notes (Signed)
Subjective:    Patient ID: Mariah Morris, female    DOB: 1971/01/16, 49 y.o.   MRN: [id number]  HPI: Mariah Morris is a 49 y.o. female who returns for follow up appointment for chronic pain and medication refill. She states her pain is located in her lower back radiating into her bilateral lower extremities. She rates her pain 6. Her current exercise regime is walking, riding on her stationary bicycle for 10 minutes  and performing stretching exercises.  Mariah Morris Morphine equivalent is 49. . She  is also prescribed Alprazolam  by [name].We have discussed the black box warning of using opioids and benzodiazepines. I highlighted the dangers of using these drugs together and discussed the adverse events including respiratory suppression, overdose, cognitive impairment and importance of compliance with current regimen. We will continue to monitor and adjust as indicated.  She  is being closely monitored and under the care of her psychiatrist at [facility] .    Last UDS was Performed on 07/29/2019, it was consistent.    Pain Inventory Average Pain 7 Pain Right Now 6 My pain is constant, sharp, stabbing and aching  In the last 24 hours, has pain interfered with the following? General activity 8 Relation with others 8 Enjoyment of life 8 What TIME of day is your pain at its worst? evening and night Sleep (in general) Good  Pain is worse with: bending, sitting and standing Pain improves with: heat/ice, therapy/exercise and medication Relief from Meds: 8  Family History  Problem Relation Age of Onset  . Hypertension Mother   . Diabetes Father   . Hypertension Father    Social History   Socioeconomic History  . Marital status: Single    Spouse name: Not on file  . Number of children: Not on file  . Years of education: Not on file  . Highest education level: Not on file  Occupational History  . Not on file  Tobacco Use  . Smoking status: Never  Smoker  . Smokeless tobacco: Never Used  Substance and Sexual Activity  . Alcohol use: No  . Drug use: Not on file  . Sexual activity: Not on file  Other Topics Concern  . Not on file  Social History Narrative  . Not on file   Social Determinants of Health   Financial Resource Strain:   . Difficulty of Paying Living Expenses: Not on file  Food Insecurity:   . Worried About Programme researcher, broadcasting/film/video in the Last Year: Not on file  . Ran Out of Food in the Last Year: Not on file  Transportation Needs:   . Lack of Transportation (Medical): Not on file  . Lack of Transportation (Non-Medical): Not on file  Physical Activity:   . Days of Exercise per Week: Not on file  . Minutes of Exercise per Session: Not on file  Stress:   . Feeling of Stress : Not on file  Social Connections:   . Frequency of Communication with Friends and Family: Not on file  . Frequency of Social Gatherings with Friends and Family: Not on file  . Attends Religious Services: Not on file  . Active Member of Clubs or Organizations: Not on file  . Attends Banker Meetings: Not on file  . Marital Status: Not on file   Past Surgical History:  Procedure Laterality Date  . CESAREAN SECTION     x2  . OVARIAN CYST REMOVAL     Past Surgical  History:  Procedure Laterality Date  . CESAREAN SECTION     x2  . OVARIAN CYST REMOVAL     Past Medical History:  Diagnosis Date  . Chronic back pain   . GAD (generalized anxiety disorder)   . GERD (gastroesophageal reflux disease)   . Gestational diabetes   . Hypertension   . Neuromuscular disorder (HCC) 2011   MS  . Presence of permanent cardiac pacemaker   . Seizures (HCC)    BP 107/68   Pulse 77   Temp 98.7 F (37.1 C)   Ht 5\' 2"  (1.575 m)   Wt 289 lb (131.1 kg)   SpO2 98%   BMI 52.86 kg/m   Opioid Risk Score:   Fall Risk Score:  `1  Depression screen PHQ 2/9  Depression screen [facility] 2/9 08/27/2019 07/29/2019 07/03/2018 08/06/2017 07/05/2017 06/07/2017  05/08/2017  Decreased Interest 0 1 1 1 1 1 1   Down, Depressed, Hopeless 0 1 1 1 1 1 1   PHQ - 2 Score 0 2 2 2 2 2 2   Altered sleeping - - - - - - 0  Tired, decreased energy - - - - - - 1  Change in appetite - - - - - - -  Feeling bad or failure about yourself  - - - - - - 1  Trouble concentrating - - - - - - 0  Moving slowly or fidgety/restless - - - - - - 0  Suicidal thoughts - - - - - - 0  PHQ-9 Score - - - - - - 4    Review of Systems  Constitutional: Negative.   HENT: Negative.   Eyes: Negative.   Respiratory: Negative.   Cardiovascular: Negative.   Gastrointestinal: Negative.   Endocrine: Negative.   Genitourinary: Negative.   Musculoskeletal: Positive for back pain.       Spasms  Skin: Negative.   Neurological: Positive for weakness and numbness.       Tingling  Psychiatric/Behavioral: Negative.        Objective:   Physical Exam Vitals and nursing note reviewed.  Constitutional:      Appearance: Normal appearance. She is obese.  Cardiovascular:     Rate and Rhythm: Normal rate and regular rhythm.     Pulses: Normal pulses.     Heart sounds: Normal heart sounds.  Pulmonary:     Effort: Pulmonary effort is normal.     Breath sounds: Normal breath sounds.  Musculoskeletal:     Cervical back: Normal range of motion and neck supple.     Comments: Normal Muscle Bulk and Muscle Testing Reveals:  Upper Extremities: Full ROM and Muscle Strength 5/5 Lumbar Paraspinal Tenderness: L-3-L-5 Lower Extremities: Full ROM And Muscle Strength 5/5 Arises from Table slowly Antalgic  Gait   Skin:    General: Skin is warm and dry.  Neurological:     Mental Status: She is alert and oriented to person, place, and time.  Psychiatric:        Behavior: Behavior normal.           Assessment & Plan:  1.Multiple Sclerosis: Neurology Following: Dr. Shah.11/28/2019 2.Lumbar Spondylolisthesis/Low back pain/ Lumbar Radiculitis/ Neuropathic Pain: Continue Current exercise Regime.  Continue using heat therapy. Continuecurrent medication regimen withGabapentin.11/28/2019. 3. Chronic Pain:Continue with the slow weaning.Refilled: OxyCODONE 10/325 mg one tablet4times dailyas need for pain#95We willcontinue to monitor. 11/28/2019. We will continue the opioid monitoring program, this consists of regular clinic visits, examinations, urine drug screen, pill counts as  well as use of [place] Controlled Substance Reporting system. A 12 month History has been reviewed on the [place] Controlled Substance Reporting Systemon 11/28/2019. 4. Muscle Spasms: Continuecurrent medication regimen withTizanidine.11/28/2019 5. Depression/anxiety:Psychiatry Following Dr. Percell Belt Continue Monthly Counseling. Continue Current Medication Regime. Xanax .11/28/2019 6. Morbid obesity:Encouraged to continue with weight Loss and Healthy Living Regimen.11/28/2019 7. Insomnia:No complaintstoday.Continueto monitor.11/28/2019 8.RightGreater trochanter Bursitis:No complaints today.Continue Alternating Ice and Heat Therapy.Continue to monitor.11/28/2019.. 9. Polyarthralgia: Bilateral Hands: No complaints today. Continue current medication regimen. Continue to monitor.11/28/2019.  F/U in 1 Month  of face to face patient care time was spent during this visit. All questions were encouraged and answered.

## 2019-12-22 ENCOUNTER — Other Ambulatory Visit: Payer: Self-pay

## 2019-12-22 ENCOUNTER — Encounter: Payer: Self-pay | Admitting: Registered Nurse

## 2019-12-22 ENCOUNTER — Encounter: Payer: Medicare Other | Attending: Physical Medicine & Rehabilitation | Admitting: Registered Nurse

## 2019-12-22 VITALS — BP 116/79 | HR 70 | Temp 98.2°F | Ht 62.0 in | Wt 282.0 lb

## 2019-12-22 DIAGNOSIS — Z5181 Encounter for therapeutic drug level monitoring: Secondary | ICD-10-CM | POA: Insufficient documentation

## 2019-12-22 DIAGNOSIS — G894 Chronic pain syndrome: Secondary | ICD-10-CM

## 2019-12-22 DIAGNOSIS — Z79891 Long term (current) use of opiate analgesic: Secondary | ICD-10-CM | POA: Insufficient documentation

## 2019-12-22 DIAGNOSIS — G35 Multiple sclerosis: Secondary | ICD-10-CM | POA: Insufficient documentation

## 2019-12-22 DIAGNOSIS — M5416 Radiculopathy, lumbar region: Secondary | ICD-10-CM | POA: Diagnosis present

## 2019-12-22 DIAGNOSIS — M4316 Spondylolisthesis, lumbar region: Secondary | ICD-10-CM | POA: Diagnosis present

## 2019-12-22 DIAGNOSIS — M7061 Trochanteric bursitis, right hip: Secondary | ICD-10-CM | POA: Diagnosis present

## 2019-12-22 MED ORDER — OXYCODONE-ACETAMINOPHEN 10-325 MG PO TABS: 1.0000 | ORAL_TABLET | Freq: Four times a day (QID) | ORAL | 0 refills | Status: DC | PRN

## 2019-12-22 NOTE — Progress Notes (Signed)
Subjective:    Patient ID: Mariah Morris, female    DOB: [birth year], 49 y.o.   MRN: [id number]  HPI: Mariah Morris is a 49 y.o. female who returns for follow up appointment for chronic pain and medication refill. She states her pain is located in her mid- lower back radiating into her  bilateral lower extremities with numbness and tingling. Also reports right hip pain. She rates her pain 6. Her current exercise regime is walking, riding her stationary bicycle for 10 minutes two days a week and performing stretching exercises.  [name] Morphine equivalent is 55.76 MME. She  is also prescribed Alprazolam by [name].We have discussed the black box warning of using opioids and benzodiazepines. I highlighted the dangers of using these drugs together and discussed the adverse events including respiratory suppression, overdose, cognitive impairment and importance of compliance with current regimen. We will continue to monitor and adjust as indicated.  She is being closely monitored and under the care of her psychiatrist.  Pain Inventory Average Pain 5 Pain Right Now 6 My pain is constant, sharp, tingling and aching  In the last 24 hours, has pain interfered with the following? General activity 5 Relation with others 5 Enjoyment of life 5 What TIME of day is your pain at its worst? morning  and night Sleep (in general) GOOD - FAIR  Pain is worse with: walking, bending, sitting and standing Pain improves with: heat/ice, therapy/exercise and medication Relief from Meds: 7  Family History  Problem Relation Age of Onset  . Hypertension Mother   . Diabetes Father   . Hypertension Father    Social History   Socioeconomic History  . Marital status: Single    Spouse name: Not on file  . Number of children: Not on file  . Years of education: Not on file  . Highest education level: Not on file  Occupational History  . Not on file  Tobacco Use  . Smoking status: Never  Smoker  . Smokeless tobacco: Never Used  Vaping Use  . Vaping Use: Never used  Substance and Sexual Activity  . Alcohol use: No  . Drug use: Not on file  . Sexual activity: Not on file  Other Topics Concern  . Not on file  Social History Narrative  . Not on file   Social Determinants of Health   Financial Resource Strain:   . Difficulty of Paying Living Expenses: Not on file  Food Insecurity:   . Worried About Programme researcher, broadcasting/film/video in the Last Year: Not on file  . Ran Out of Food in the Last Year: Not on file  Transportation Needs:   . Lack of Transportation (Medical): Not on file  . Lack of Transportation (Non-Medical): Not on file  Physical Activity:   . Days of Exercise per Week: Not on file  . Minutes of Exercise per Session: Not on file  Stress:   . Feeling of Stress : Not on file  Social Connections:   . Frequency of Communication with Friends and Family: Not on file  . Frequency of Social Gatherings with Friends and Family: Not on file  . Attends Religious Services: Not on file  . Active Member of Clubs or Organizations: Not on file  . Attends Banker Meetings: Not on file  . Marital Status: Not on file   Past Surgical History:  Procedure Laterality Date  . CESAREAN SECTION     x2  . OVARIAN CYST REMOVAL  Past Surgical History:  Procedure Laterality Date  . CESAREAN SECTION     x2  . OVARIAN CYST REMOVAL     Past Medical History:  Diagnosis Date  . Chronic back pain   . GAD (generalized anxiety disorder)   . GERD (gastroesophageal reflux disease)   . Gestational diabetes   . Hypertension   . Neuromuscular disorder (HCC) 2011   MS  . Presence of permanent cardiac pacemaker   . Seizures (HCC)    BP 116/79   Pulse 70   Temp 98.2 F (36.8 C)   Ht 5\' 2"  (1.575 m)   Wt 282 lb (127.9 kg)   SpO2 98%   BMI 51.58 kg/m   Opioid Risk Score:   Fall Risk Score:  `1  Depression screen PHQ 2/9  Depression screen [facility] 2/9 08/27/2019  07/29/2019 07/03/2018 08/06/2017 07/05/2017 06/07/2017 05/08/2017  Decreased Interest 0 1 1 1 1 1 1   Down, Depressed, Hopeless 0 1 1 1 1 1 1   PHQ - 2 Score 0 2 2 2 2 2 2   Altered sleeping - - - - - - 0  Tired, decreased energy - - - - - - 1  Change in appetite - - - - - - -  Feeling bad or failure about yourself  - - - - - - 1  Trouble concentrating - - - - - - 0  Moving slowly or fidgety/restless - - - - - - 0  Suicidal thoughts - - - - - - 0  PHQ-9 Score - - - - - - 4   Review of Systems  Musculoskeletal: Positive for back pain and gait problem.  All other systems reviewed and are negative.      Objective:   Physical Exam Vitals and nursing note reviewed.  Constitutional:      Appearance: Normal appearance. She is obese.  Cardiovascular:     Rate and Rhythm: Normal rate and regular rhythm.     Pulses: Normal pulses.     Heart sounds: Normal heart sounds.  Pulmonary:     Effort: Pulmonary effort is normal.     Breath sounds: Normal breath sounds.  Musculoskeletal:     Cervical back: Normal range of motion and neck supple.     Comments: Normal Muscle Bulk and Muscle Testing Reveals:  Upper Extremities: Full ROM and Muscle Strength 5/5 Lumbar Paraspinal Tenderness: L-3-L-5 Right Greater Trochanter tenderness Lower Extremities: Full ROM and Muscle Strength 5/5 Arises from table slowly Narrow Based  Gait   Skin:    General: Skin is warm and dry.  Neurological:     Mental Status: She is alert and oriented to person, place, and time.  Psychiatric:        Mood and Affect: Mood normal.        Behavior: Behavior normal.           Assessment & Plan:  1.Multiple Sclerosis: Neurology Following: Dr. Shah.12/22/2019 2.Lumbar Spondylolisthesis/Low back pain/ Lumbar Radiculitis/ Neuropathic Pain: Continue Current exercise Regime. Continue using heat therapy. Continuecurrent medication regimen withGabapentin.12/22/2019. 3. Chronic Pain:Continue with the slow weaning.Refilled:  OxyCODONE 10/325 mg one tablet4times dailyas need for pain#95We willcontinue to monitor. 12/22/2019. We will continue the opioid monitoring program, this consists of regular clinic visits, examinations, urine drug screen, pill counts as well as use of Controlled Substance Reporting system. A 12 month History has been reviewed on the 12/24/2019 Controlled Substance Reporting Systemon 12/22/2019. 4. Muscle Spasms: Continuecurrent medication regimen withTizanidine.12/22/2019  5. Depression/anxiety:Psychiatry Following Dr. Percell Belt Continue Monthly Counseling. Continue Current Medication Regime. Xanax .12/22/2019 6. Morbid obesity:Encouraged to continue with weight Loss and Healthy Living Regimen.12/22/2019 7. Insomnia:No complaintstoday.Continueto monitor.12/22/2019 8.RightGreater trochanter Bursitis:Continue Alternating Ice and Heat Therapy.Continue to monitor.12/22/2019.. 9. Polyarthralgia: Bilateral Hands:No complaints today.Continue current medication regimen. Continue to monitor.12/22/2019.  F/U in 1 Month  of face to face patient care time was spent during this visit. All questions were encouraged and answered.

## 2019-12-31 LAB — TOXASSURE SELECT,+ANTIDEPR,UR

## 2020-01-05 ENCOUNTER — Telehealth: Payer: Self-pay | Admitting: *Deleted

## 2020-01-05 NOTE — Telephone Encounter (Signed)
Urine drug screen for this encounter is consistent for prescribed medication 

## 2020-01-14 ENCOUNTER — Other Ambulatory Visit: Payer: Self-pay

## 2020-01-14 ENCOUNTER — Encounter: Payer: Medicare Other | Attending: Physical Medicine & Rehabilitation | Admitting: Registered Nurse

## 2020-01-14 ENCOUNTER — Encounter: Payer: Self-pay | Admitting: Registered Nurse

## 2020-01-14 VITALS — BP 134/83 | HR 84 | Temp 98.6°F | Ht 62.0 in | Wt 283.0 lb

## 2020-01-14 DIAGNOSIS — M4316 Spondylolisthesis, lumbar region: Secondary | ICD-10-CM

## 2020-01-14 DIAGNOSIS — Z5181 Encounter for therapeutic drug level monitoring: Secondary | ICD-10-CM | POA: Diagnosis present

## 2020-01-14 DIAGNOSIS — M5416 Radiculopathy, lumbar region: Secondary | ICD-10-CM | POA: Diagnosis present

## 2020-01-14 DIAGNOSIS — G894 Chronic pain syndrome: Secondary | ICD-10-CM | POA: Diagnosis not present

## 2020-01-14 DIAGNOSIS — Z79891 Long term (current) use of opiate analgesic: Secondary | ICD-10-CM

## 2020-01-14 DIAGNOSIS — G35D Multiple sclerosis, unspecified: Secondary | ICD-10-CM

## 2020-01-14 DIAGNOSIS — G35 Multiple sclerosis: Secondary | ICD-10-CM | POA: Diagnosis present

## 2020-01-14 MED ORDER — OXYCODONE-ACETAMINOPHEN 10-325 MG PO TABS: 1.0000 | ORAL_TABLET | Freq: Four times a day (QID) | ORAL | 0 refills | Status: DC | PRN

## 2020-01-14 NOTE — Progress Notes (Signed)
Subjective:    Patient ID: Mariah Morris, female    DOB: [birth year], 49 y.o.   MRN: [id number]  HPI: Mariah Morris is a 49 y.o. female who returns for follow up appointment for chronic pain and medication refill. She states her pain is located in her lower back radiating into his bilateral lower extremities. He rates his pain 6. Her current exercise regime is walking and performing stretching exercises.  Mariah Morris Morphine equivalent is 61.96 MME. She  is also prescribed Alprazolam  by [name] . We have discussed the black box warning of using opioids and benzodiazepines. I highlighted the dangers of using these drugs together and discussed the adverse events including respiratory suppression, overdose, cognitive impairment and importance of compliance with current regimen. We will continue to monitor and adjust as indicated.  She  is being closely monitored and under the care of her psychiatrist.  Last UDS was Performed on 12/22/2019, it was consistent.    Pain Inventory Average Pain 6 Pain Right Now 6 My pain is constant, tingling and aching  In the last 24 hours, has pain interfered with the following? General activity 5 Relation with others 5 Enjoyment of life 5 What TIME of day is your pain at its worst? evening and night Sleep (in general) Fair  Pain is worse with: walking, sitting, standing and some activites Pain improves with: rest and medication Relief from Meds: 7  Family History  Problem Relation Age of Onset  . Hypertension Mother   . Diabetes Father   . Hypertension Father    Social History   Socioeconomic History  . Marital status: Single    Spouse name: Not on file  . Number of children: Not on file  . Years of education: Not on file  . Highest education level: Not on file  Occupational History  . Not on file  Tobacco Use  . Smoking status: Never Smoker  . Smokeless tobacco: Never Used  Vaping Use  . Vaping Use: Never used  Substance  and Sexual Activity  . Alcohol use: No  . Drug use: Not on file  . Sexual activity: Not on file  Other Topics Concern  . Not on file  Social History Narrative  . Not on file   Social Determinants of Health   Financial Resource Strain: Not on file  Food Insecurity: Not on file  Transportation Needs: Not on file  Physical Activity: Not on file  Stress: Not on file  Social Connections: Not on file   Past Surgical History:  Procedure Laterality Date  . CESAREAN SECTION     x2  . OVARIAN CYST REMOVAL     Past Surgical History:  Procedure Laterality Date  . CESAREAN SECTION     x2  . OVARIAN CYST REMOVAL     Past Medical History:  Diagnosis Date  . Chronic back pain   . GAD (generalized anxiety disorder)   . GERD (gastroesophageal reflux disease)   . Gestational diabetes   . Hypertension   . Neuromuscular disorder (HCC) 2011   MS  . Presence of permanent cardiac pacemaker   . Seizures (HCC)    BP 134/83   Pulse 84   Temp 98.6 F (37 C)   Ht 5\' 2"  (1.575 m)   Wt 283 lb (128.4 kg)   SpO2 98%   BMI 51.76 kg/m   Opioid Risk Score:   Fall Risk Score:  `1  Depression screen PHQ 2/9  Depression screen  [facility] 2/9 12/22/2019 08/27/2019 07/29/2019 07/03/2018 08/06/2017 07/05/2017 06/07/2017  Decreased Interest 0 0 1 1 1 1 1   Down, Depressed, Hopeless 0 0 1 1 1 1 1   PHQ - 2 Score 0 0 2 2 2 2 2   Altered sleeping - - - - - - -  Tired, decreased energy - - - - - - -  Change in appetite - - - - - - -  Feeling bad or failure about yourself  - - - - - - -  Trouble concentrating - - - - - - -  Moving slowly or fidgety/restless - - - - - - -  Suicidal thoughts - - - - - - -  PHQ-9 Score - - - - - - -    Review of Systems  Constitutional: Negative.   HENT: Negative.   Eyes: Negative.   Respiratory: Negative.   Cardiovascular: Negative.   Gastrointestinal: Negative.   Endocrine: Negative.   Genitourinary: Negative.   Musculoskeletal: Positive for arthralgias and back pain.   Skin: Negative.   Allergic/Immunologic: Negative.   Neurological: Positive for weakness and numbness.       Tingling   Hematological: Negative.   Psychiatric/Behavioral: Negative.   All other systems reviewed and are negative.      Objective:   Physical Exam Vitals and nursing note reviewed.  Constitutional:      Appearance: Normal appearance.  Cardiovascular:     Rate and Rhythm: Normal rate and regular rhythm.     Pulses: Normal pulses.     Heart sounds: Normal heart sounds.  Pulmonary:     Effort: Pulmonary effort is normal.     Breath sounds: Normal breath sounds.  Musculoskeletal:     Cervical back: Normal range of motion and neck supple.     Comments: Normal Muscle Bulk and Muscle Testing Reveals:  Upper Extremities: Full ROM and Muscle Strength 5/5  Lumbar Paraspinal L-3-L-5 Lower Extremities: Full ROM and Muscle Strength 5/5 Bilateral Lower Extremities Flexion Produces Pain into her Bilateral Lower Extremities Arises from Table Slowly Antalgic Gait   Skin:    General: Skin is warm and dry.  Neurological:     Mental Status: She is alert and oriented to person, place, and time.  Psychiatric:        Mood and Affect: Mood normal.        Behavior: Behavior normal.           Assessment & Plan:  1.Multiple Sclerosis: Neurology Following: Dr. Shah.01/14/2020 2.Lumbar Spondylolisthesis/Low back pain/ Lumbar Radiculitis/ Neuropathic Pain: Continue Current exercise Regime. Continue using heat therapy. Continuecurrent medication regimen withGabapentin.01/14/2020. 3. Chronic Pain:Continue with the slow weaning.Refilled: OxyCODONE 10/325 mg one tablet4times dailyas need for pain#95We willcontinue to monitor.01/14/2020. We will continue the opioid monitoring program, this consists of regular clinic visits, examinations, urine drug screen, pill counts as well as use of 01/16/2020 Controlled Substance Reporting system. A 12 month History has been  reviewed on the 01/16/2020 Controlled Substance Reporting Systemon12/15/2021. 4. Muscle Spasms: Continuecurrent medication regimen withTizanidine.01/14/2020 5. Depression/anxiety:Psychiatry Following Dr. [place] Continue Monthly Counseling. Continue Current Medication Regime. Xanax .01/14/2020 6. Morbid obesity:Encouraged to continue with weight Loss and Healthy Living Regimen.01/14/2020 7. Insomnia:No complaintstoday.Continueto monitor.01/14/2020 8.RightGreater trochanter Bursitis:No complaints today. Continue Alternating Ice and Heat Therapy.Continue to monitor.01/14/2020.. 9. Polyarthralgia: Bilateral Hands:No complaints today.Continue current medication regimen. Continue to monitor.01/14/2020.  F/U in 1 Month

## 2020-01-16 ENCOUNTER — Ambulatory Visit: Payer: Medicare Other | Admitting: Registered Nurse

## 2020-02-20 ENCOUNTER — Encounter: Payer: Medicare Other | Admitting: Registered Nurse

## 2020-02-23 ENCOUNTER — Encounter: Payer: Medicare Other | Attending: Physical Medicine & Rehabilitation | Admitting: Registered Nurse

## 2020-02-23 ENCOUNTER — Encounter: Payer: Self-pay | Admitting: Registered Nurse

## 2020-02-23 ENCOUNTER — Other Ambulatory Visit: Payer: Self-pay

## 2020-02-23 VITALS — BP 109/76 | HR 75 | Temp 98.3°F | Ht 62.0 in | Wt 290.0 lb

## 2020-02-23 DIAGNOSIS — G35 Multiple sclerosis: Secondary | ICD-10-CM | POA: Insufficient documentation

## 2020-02-23 DIAGNOSIS — G894 Chronic pain syndrome: Secondary | ICD-10-CM | POA: Diagnosis not present

## 2020-02-23 DIAGNOSIS — Z79891 Long term (current) use of opiate analgesic: Secondary | ICD-10-CM | POA: Diagnosis present

## 2020-02-23 DIAGNOSIS — M5416 Radiculopathy, lumbar region: Secondary | ICD-10-CM | POA: Insufficient documentation

## 2020-02-23 DIAGNOSIS — M4316 Spondylolisthesis, lumbar region: Secondary | ICD-10-CM | POA: Diagnosis not present

## 2020-02-23 DIAGNOSIS — Z5181 Encounter for therapeutic drug level monitoring: Secondary | ICD-10-CM | POA: Insufficient documentation

## 2020-02-23 MED ORDER — OXYCODONE-ACETAMINOPHEN 10-325 MG PO TABS: 1.0000 | ORAL_TABLET | Freq: Four times a day (QID) | ORAL | 0 refills | Status: DC | PRN

## 2020-02-23 NOTE — Progress Notes (Signed)
Subjective:    Patient ID: Mariah Morris, female    DOB: May 23, 1970, 50 y.o.   MRN: [id number]  HPI: NASIAH Morris is a 50 y.o. female who returns for follow up appointment for chronic pain and medication refill. She states her pain is located in her lower back radiating into her bilateral lower extremities. She rates her pain 7. Her current exercise regime is walking and performing stretching exercises.  Ms. Scarpelli Morphine equivalent is 45.43 MME.She  is also prescribed Alprazolam by [name]. We have discussed the black box warning of using opioids and benzodiazepines. I highlighted the dangers of using these drugs together and discussed the adverse events including respiratory suppression, overdose, cognitive impairment and importance of compliance with current regimen. We will continue to monitor and adjust as indicated.  She  is being closely monitored and under the care of her psychiatrist.   Last UDS was Performed on 12/22/2019, it was consistent.   Pain Inventory Average Pain 7 Pain Right Now 7 My pain is constant  In the last 24 hours, has pain interfered with the following? General activity 5 Relation with others 5 Enjoyment of life 5 What TIME of day is your pain at its worst? evening and night Sleep (in general) Fair  Pain is worse with: walking, bending, sitting and standing Pain improves with: medication Relief from Meds: 7  Family History  Problem Relation Age of Onset  . Hypertension Mother   . Diabetes Father   . Hypertension Father    Social History   Socioeconomic History  . Marital status: Single    Spouse name: Not on file  . Number of children: Not on file  . Years of education: Not on file  . Highest education level: Not on file  Occupational History  . Not on file  Tobacco Use  . Smoking status: Never Smoker  . Smokeless tobacco: Never Used  Vaping Use  . Vaping Use: Never used  Substance and Sexual Activity  . Alcohol use: No   . Drug use: Not on file  . Sexual activity: Not on file  Other Topics Concern  . Not on file  Social History Narrative  . Not on file   Social Determinants of Health   Financial Resource Strain: Not on file  Food Insecurity: Not on file  Transportation Needs: Not on file  Physical Activity: Not on file  Stress: Not on file  Social Connections: Not on file   Past Surgical History:  Procedure Laterality Date  . CESAREAN SECTION     x2  . OVARIAN CYST REMOVAL     Past Surgical History:  Procedure Laterality Date  . CESAREAN SECTION     x2  . OVARIAN CYST REMOVAL     Past Medical History:  Diagnosis Date  . Chronic back pain   . GAD (generalized anxiety disorder)   . GERD (gastroesophageal reflux disease)   . Gestational diabetes   . Hypertension   . Neuromuscular disorder (HCC) 2011   MS  . Presence of permanent cardiac pacemaker   . Seizures (HCC)    BP 109/76   Pulse 75   Temp 98.3 F (36.8 C)   Ht 5\' 2"  (1.575 m)   Wt 290 lb (131.5 kg)   SpO2 98%   BMI 53.04 kg/m   Opioid Risk Score:   Fall Risk Score:  `1  Depression screen [facility] 2/9  Depression screen [facility] 2/9 12/22/2019 08/27/2019 07/29/2019 07/03/2018 08/06/2017 07/05/2017 06/07/2017  Decreased Interest 0 0 1 1 1 1 1   Down, Depressed, Hopeless 0 0 1 1 1 1 1   PHQ - 2 Score 0 0 2 2 2 2 2   Altered sleeping - - - - - - -  Tired, decreased energy - - - - - - -  Change in appetite - - - - - - -  Feeling bad or failure about yourself  - - - - - - -  Trouble concentrating - - - - - - -  Moving slowly or fidgety/restless - - - - - - -  Suicidal thoughts - - - - - - -  PHQ-9 Score - - - - - - -    Review of Systems  Musculoskeletal: Positive for back pain.       Leg pain Arm pain  All other systems reviewed and are negative.      Objective:   Physical Exam Vitals and nursing note reviewed.  Constitutional:      Appearance: Normal appearance. She is obese.  Cardiovascular:     Rate and Rhythm: Normal  rate and regular rhythm.     Pulses: Normal pulses.     Heart sounds: Normal heart sounds.  Pulmonary:     Effort: Pulmonary effort is normal.     Breath sounds: Normal breath sounds.  Musculoskeletal:     Cervical back: Normal range of motion and neck supple.     Comments: Normal Muscle Bulk and Muscle Testing Reveals:  Upper Extremities: Full ROM and Muscle Strength 5/5 Lumbar Hypersensitivity Lower Extremities: Full ROM and Muscle Strength 5/5 Arises from Table slowly Antalgic Gait   Skin:    General: Skin is warm and dry.  Neurological:     Mental Status: She is alert and oriented to person, place, and time.  Psychiatric:        Mood and Affect: Mood normal.        Behavior: Behavior normal.           Assessment & Plan:  1.Multiple Sclerosis: Neurology Following: Dr. Shah.02/23/2020 2.Lumbar Spondylolisthesis/Low back pain/ Lumbar Radiculitis/ Neuropathic Pain: Continue Current exercise Regime. Continue using heat therapy. Continuecurrent medication regimen withGabapentin.02/23/2020. 3. Chronic Pain:Continue with the slow weaning.Refilled: OxyCODONE 10/325 mg one tablet4times dailyas need for pain#95We willcontinue to monitor.02/23/2020. We will continue the opioid monitoring program, this consists of regular clinic visits, examinations, urine drug screen, pill counts as well as use of 02/25/2020 Controlled Substance Reporting system. A 12 month History has been reviewed on the 02/25/2020 Controlled Substance Reporting Systemon01/24/2022. 4. Muscle Spasms: Continuecurrent medication regimen withTizanidine.02/23/2020 5. Depression/anxiety:Psychiatry Following Dr. [place] Continue Monthly Counseling. Continue Current Medication Regime. Xanax .02/23/2020 6. Morbid obesity:Encouraged to continue with weight Loss and Healthy Living Regimen.02/23/2020 7. Insomnia:No complaintstoday.Continueto monitor.02/23/2020 8.RightGreater trochanter  Bursitis:No complaints today. Continue Alternating Ice and Heat Therapy.Continue to monitor.02/23/2020.. 9. Polyarthralgia: Bilateral Hands:No complaints today.Continue current medication regimen. Continue to monitor.02/23/2020.  F/U in 1 Month

## 2020-03-25 ENCOUNTER — Encounter: Payer: Medicare Other | Admitting: Registered Nurse

## 2020-03-29 ENCOUNTER — Other Ambulatory Visit: Payer: Self-pay

## 2020-03-29 ENCOUNTER — Encounter: Payer: Medicare Other | Admitting: Registered Nurse

## 2020-04-07 ENCOUNTER — Encounter: Payer: Medicare Other | Attending: Physical Medicine & Rehabilitation | Admitting: Registered Nurse

## 2020-04-07 ENCOUNTER — Encounter: Payer: Self-pay | Admitting: Registered Nurse

## 2020-04-07 ENCOUNTER — Other Ambulatory Visit: Payer: Self-pay

## 2020-04-07 VITALS — BP 120/87 | Ht 62.0 in | Wt 281.0 lb

## 2020-04-07 DIAGNOSIS — M5416 Radiculopathy, lumbar region: Secondary | ICD-10-CM | POA: Diagnosis not present

## 2020-04-07 DIAGNOSIS — M4316 Spondylolisthesis, lumbar region: Secondary | ICD-10-CM | POA: Diagnosis not present

## 2020-04-07 DIAGNOSIS — G35 Multiple sclerosis: Secondary | ICD-10-CM | POA: Insufficient documentation

## 2020-04-07 DIAGNOSIS — Z79891 Long term (current) use of opiate analgesic: Secondary | ICD-10-CM | POA: Insufficient documentation

## 2020-04-07 DIAGNOSIS — G894 Chronic pain syndrome: Secondary | ICD-10-CM | POA: Diagnosis not present

## 2020-04-07 DIAGNOSIS — Z5181 Encounter for therapeutic drug level monitoring: Secondary | ICD-10-CM | POA: Insufficient documentation

## 2020-04-07 MED ORDER — OXYCODONE-ACETAMINOPHEN 10-325 MG PO TABS: 1.0000 | ORAL_TABLET | Freq: Four times a day (QID) | ORAL | 0 refills | Status: DC | PRN

## 2020-04-07 NOTE — Progress Notes (Addendum)
Subjective:    Patient ID: Mariah Morris, female    DOB: Mar 28, 1970, 50 y.o.   MRN: [id number]  HPI: Mariah Morris is a 50 y.o. female whose appointment was a My-Chart Video Visit, she was unable to access. Her visit was changed to telephone visit. Mariah Morris agrees with telephone visit and verbalizes understanding. She states her pain is located in her lower back radiating into her right lower extremity. She rates her pain 5. Her current exercise regime is walking and performing stretching exercises.  Suezanne Jacquet RN asked The Health and History Questions. This provider and Suezanne Jacquet verified we were speaking with the correct person using two identifiers.    Mariah Morris reports she is at the hospital today, her mother is having surgery. Emotional support given.   Mariah Morris Morphine equivalent is 60.00 MME. She is also prescribed Alprazolam by [name] .We have discussed the black box warning of using opioids and benzodiazepines. I highlighted the dangers of using these drugs together and discussed the adverse events including respiratory suppression, overdose, cognitive impairment and importance of compliance with current regimen. We will continue to monitor and adjust as indicated.  She  is being closely monitored and under the care of her psychiatrist at [facility].   Last UDS was performed on 12/22/2019, it was consistent.    Pain Inventory Average Pain 7 Pain Right Now 5 My pain is intermittent  In the last 24 hours, has pain interfered with the following? General activity 5 Relation with others 5 Enjoyment of life 5 What TIME of day is your pain at its worst? evening and night Sleep (in general) Fair  Pain is worse with: walking, bending, sitting, inactivity and standing Pain improves with: rest and medication Relief from Meds: 5  Family History  Problem Relation Age of Onset  . Hypertension Mother   . Diabetes Father   . Hypertension  Father    Social History   Socioeconomic History  . Marital status: Single    Spouse name: Not on file  . Number of children: Not on file  . Years of education: Not on file  . Highest education level: Not on file  Occupational History  . Not on file  Tobacco Use  . Smoking status: Never Smoker  . Smokeless tobacco: Never Used  Vaping Use  . Vaping Use: Never used  Substance and Sexual Activity  . Alcohol use: No  . Drug use: Not on file  . Sexual activity: Not on file  Other Topics Concern  . Not on file  Social History Narrative  . Not on file   Social Determinants of Health   Financial Resource Strain: Not on file  Food Insecurity: Not on file  Transportation Needs: Not on file  Physical Activity: Not on file  Stress: Not on file  Social Connections: Not on file   Past Surgical History:  Procedure Laterality Date  . CESAREAN SECTION     x2  . OVARIAN CYST REMOVAL     Past Surgical History:  Procedure Laterality Date  . CESAREAN SECTION     x2  . OVARIAN CYST REMOVAL     Past Medical History:  Diagnosis Date  . Chronic back pain   . GAD (generalized anxiety disorder)   . GERD (gastroesophageal reflux disease)   . Gestational diabetes   . Hypertension   . Neuromuscular disorder (HCC) 2011   MS  . Presence of permanent cardiac pacemaker   .  Seizures (HCC)    BP 120/87 Comment: pt reported  Ht 5\' 2"  (1.575 m) Comment: pt reported  Wt 281 lb (127.5 kg) Comment: pt reported  BMI 51.40 kg/m   Opioid Risk Score:   Fall Risk Score:  `1  Depression screen PHQ 2/9  Depression screen [facility] 2/9 12/22/2019 08/27/2019 07/29/2019 07/03/2018 08/06/2017 07/05/2017 06/07/2017  Decreased Interest 0 0 1 1 1 1 1   Down, Depressed, Hopeless 0 0 1 1 1 1 1   PHQ - 2 Score 0 0 2 2 2 2 2   Altered sleeping - - - - - - -  Tired, decreased energy - - - - - - -  Change in appetite - - - - - - -  Feeling bad or failure about yourself  - - - - - - -  Trouble concentrating - - - - - -  -  Moving slowly or fidgety/restless - - - - - - -  Suicidal thoughts - - - - - - -  PHQ-9 Score - - - - - - -   Review of Systems  Constitutional: Negative.   HENT: Negative.   Eyes: Negative.   Respiratory: Negative.   Cardiovascular: Negative.   Gastrointestinal: Negative.   Endocrine: Negative.   Musculoskeletal: Positive for back pain.  Allergic/Immunologic: Negative.   Neurological: Negative.   Hematological: Negative.   Psychiatric/Behavioral: Positive for dysphoric mood.       Mother has been diagnosed with breat cancer and having surgery  All other systems reviewed and are negative.      Objective:   Physical Exam Vitals and nursing note reviewed.  Musculoskeletal:     Comments: No Physical Exam Performed: My-Chart Video Visit           Assessment & Plan:  1.Multiple Sclerosis: Neurology Following: Dr. Shah.04/07/2020 2.Lumbar Spondylolisthesis/Low back pain/ Lumbar Radiculitis/ Neuropathic Pain: Continue Current exercise Regime. Continue using heat therapy. Continuecurrent medication regimen withGabapentin.04/07/2020. 3. Chronic Pain:Continue with the slow weaning.Refilled: OxyCODONE 10/325 mg one tablet4times dailyas need for pain#85We willcontinue to monitor.04/07/2020. We will continue the opioid monitoring program, this consists of regular clinic visits, examinations, urine drug screen, pill counts as well as use of Controlled Substance Reporting system. A 12 month History has been reviewed on the 06/07/2020 Controlled Substance Reporting Systemon03/09/2020. 4. Muscle Spasms: Continuecurrent medication regimen withTizanidine.04/07/2020 5. Depression/anxiety:Psychiatry Following Dr. [place] Continue Monthly Counseling. Continue Current Medication Regime. Xanax .04/07/2020 6. Morbid obesity:Encouraged to continue with weight Loss and Healthy Living Regimen.04/07/2020 7. Insomnia:No complaintstoday.Continueto  monitor.04/07/2020 8.RightGreater trochanter Bursitis:No complaints today.Continue Alternating Ice and Heat Therapy.Continue to monitor.04/07/2020.. 9. Polyarthralgia: Bilateral Hands:No complaints today.Continue current medication regimen. Continue to monitor.04/07/2020.  Telephone Visit Established Patient Location of Patient: At Hospital: Her mother is having surgery, she reports Location of Provider: At the Office Total Time Spent: 13 minutes   F/U in 1 Month

## 2020-04-12 ENCOUNTER — Telehealth: Payer: Self-pay | Admitting: Registered Nurse

## 2020-04-12 DIAGNOSIS — M4316 Spondylolisthesis, lumbar region: Secondary | ICD-10-CM

## 2020-04-12 MED ORDER — OXYCODONE-ACETAMINOPHEN 10-325 MG PO TABS: 1.0000 | ORAL_TABLET | Freq: Four times a day (QID) | ORAL | 0 refills | Status: DC | PRN

## 2020-04-12 NOTE — Addendum Note (Signed)
Addended by: Jones Bales on: 04/12/2020 11:52 AM   Modules accepted: Orders

## 2020-04-12 NOTE — Telephone Encounter (Signed)
Mariah Morris called the office stating her pharmacy never received the Oxycodone prescription. [name] called thepharmacy, they reported they never received the prescription. New prescription e-scribed today and Mariah Morris is aware of the above and verbalizes understanding.

## 2020-04-12 NOTE — Telephone Encounter (Signed)
Patient states the pharmacy has not received prescription to be filled.

## 2020-05-21 ENCOUNTER — Encounter: Payer: Self-pay | Admitting: Registered Nurse

## 2020-05-21 ENCOUNTER — Other Ambulatory Visit: Payer: Self-pay

## 2020-05-21 ENCOUNTER — Encounter: Payer: Medicare Other | Attending: Physical Medicine & Rehabilitation | Admitting: Registered Nurse

## 2020-05-21 VITALS — BP 123/84 | HR 77 | Temp 98.3°F | Ht 62.0 in | Wt 291.4 lb

## 2020-05-21 DIAGNOSIS — G35 Multiple sclerosis: Secondary | ICD-10-CM | POA: Insufficient documentation

## 2020-05-21 DIAGNOSIS — G894 Chronic pain syndrome: Secondary | ICD-10-CM | POA: Diagnosis not present

## 2020-05-21 DIAGNOSIS — M5416 Radiculopathy, lumbar region: Secondary | ICD-10-CM | POA: Diagnosis not present

## 2020-05-21 DIAGNOSIS — M25512 Pain in left shoulder: Secondary | ICD-10-CM | POA: Diagnosis present

## 2020-05-21 DIAGNOSIS — Z5181 Encounter for therapeutic drug level monitoring: Secondary | ICD-10-CM | POA: Diagnosis present

## 2020-05-21 DIAGNOSIS — M25511 Pain in right shoulder: Secondary | ICD-10-CM | POA: Diagnosis present

## 2020-05-21 DIAGNOSIS — Z79891 Long term (current) use of opiate analgesic: Secondary | ICD-10-CM | POA: Diagnosis not present

## 2020-05-21 DIAGNOSIS — G8929 Other chronic pain: Secondary | ICD-10-CM | POA: Diagnosis present

## 2020-05-21 DIAGNOSIS — M4316 Spondylolisthesis, lumbar region: Secondary | ICD-10-CM | POA: Insufficient documentation

## 2020-05-21 MED ORDER — OXYCODONE-ACETAMINOPHEN 10-325 MG PO TABS: 1.0000 | ORAL_TABLET | Freq: Four times a day (QID) | ORAL | 0 refills | Status: DC | PRN

## 2020-05-21 NOTE — Progress Notes (Signed)
Subjective:    Patient ID: Mariah Morris, female    DOB: [birth year], 50 y.o.   MRN: [id number]  HPI: Mariah Morris is a 50 y.o. female who returns for follow up appointment for chronic pain and medication refill. She states her pain is located in her bilateral shoulders and bilateral  lower back radiating into her bilateral lower extremities. She rates her pain 6. Her current exercise regime is walking and performing stretching exercises.  [name] Morphine equivalent is 24.29 MME.  UDS ordered today. Pain Inventory Average Pain 6 Pain Right Now 6 My pain is constant, burning, tingling and aching  In the last 24 hours, has pain interfered with the following? General activity 4 Relation with others 4 Enjoyment of life 4 What TIME of day is your pain at its worst? varies Sleep (in general) Fair  Pain is worse with: walking, bending, sitting and standing Pain improves with: rest, heat/ice, therapy/exercise and medication Relief from Meds: 7  Family History  Problem Relation Age of Onset  . Hypertension Mother   . Diabetes Father   . Hypertension Father    Social History   Socioeconomic History  . Marital status: Single    Spouse name: Not on file  . Number of children: Not on file  . Years of education: Not on file  . Highest education level: Not on file  Occupational History  . Not on file  Tobacco Use  . Smoking status: Never Smoker  . Smokeless tobacco: Never Used  Vaping Use  . Vaping Use: Never used  Substance and Sexual Activity  . Alcohol use: No  . Drug use: Not on file  . Sexual activity: Not on file  Other Topics Concern  . Not on file  Social History Narrative  . Not on file   Social Determinants of Health   Financial Resource Strain: Not on file  Food Insecurity: Not on file  Transportation Needs: Not on file  Physical Activity: Not on file  Stress: Not on file  Social Connections: Not on file   Past Surgical History:  Procedure  Laterality Date  . CESAREAN SECTION     x2  . OVARIAN CYST REMOVAL     Past Surgical History:  Procedure Laterality Date  . CESAREAN SECTION     x2  . OVARIAN CYST REMOVAL     Past Medical History:  Diagnosis Date  . Chronic back pain   . GAD (generalized anxiety disorder)   . GERD (gastroesophageal reflux disease)   . Gestational diabetes   . Hypertension   . Neuromuscular disorder (HCC) 2011   MS  . Presence of permanent cardiac pacemaker   . Seizures (HCC)    BP 123/84   Pulse 77   Temp 98.3 F (36.8 C)   Ht 5\' 2"  (1.575 m)   Wt 291 lb 6.4 oz (132.2 kg)   SpO2 96%   BMI 53.30 kg/m   Opioid Risk Score:   Fall Risk Score:  `1  Depression screen PHQ 2/9  Depression screen [facility] 2/9 04/07/2020 12/22/2019 08/27/2019 07/29/2019 07/03/2018 08/06/2017 07/05/2017  Decreased Interest 3 0 0 1 1 1 1   Down, Depressed, Hopeless 3 0 0 1 1 1 1   PHQ - 2 Score 6 0 0 2 2 2 2   Altered sleeping - - - - - - -  Tired, decreased energy - - - - - - -  Change in appetite - - - - - - -  Feeling bad or failure about yourself  - - - - - - -  Trouble concentrating - - - - - - -  Moving slowly or fidgety/restless - - - - - - -  Suicidal thoughts - - - - - - -  PHQ-9 Score - - - - - - -      Review of Systems  Constitutional: Negative.   HENT: Negative.   Eyes: Negative.   Respiratory: Negative.   Cardiovascular: Negative.  Negative for chest pain.  Gastrointestinal: Negative.   Endocrine: Negative.   Genitourinary: Negative.   Musculoskeletal: Positive for back pain and gait problem.       RIGHT AND LEFT LEG PAIN  Skin: Negative.   Allergic/Immunologic: Negative.   Hematological: Negative.   Psychiatric/Behavioral: Negative.        Objective:   Physical Exam Vitals and nursing note reviewed.  Constitutional:      Appearance: Normal appearance.  Cardiovascular:     Rate and Rhythm: Normal rate and regular rhythm.     Pulses: Normal pulses.     Heart sounds: Normal heart sounds.   Pulmonary:     Effort: Pulmonary effort is normal.     Breath sounds: Normal breath sounds.  Musculoskeletal:     Cervical back: Normal range of motion and neck supple.     Comments: Normal Muscle Bulk and Muscle Testing Reveals:  Upper Extremities: Full ROM and Muscle Strength 5/5 Bilateral AC Joint Tenderness Lumbar Paraspinal Tenderness: L-3-L-5 Lower Extremities: Decreased ROM and Muscle Strength 5/5 Bilateral Lower Extremities Flexion Produces Pain into her Lumbar Arises from Table Slowly Antalgic  Gait   Skin:    General: Skin is warm and dry.  Neurological:     Mental Status: She is alert and oriented to person, place, and time.  Psychiatric:        Mood and Affect: Mood normal.        Behavior: Behavior normal.           Assessment & Plan:  1.Multiple Sclerosis: Neurology Following: Dr. Shah.05/21/2020 2.Lumbar Spondylolisthesis/Low back pain/ Lumbar Radiculitis/ Neuropathic Pain: Continue Current exercise Regime. Continue using heat therapy. Continuecurrent medication regimen withGabapentin.05/21/2020. 3. Chronic Pain:Continue with the slow weaning.Refilled: OxyCODONE 10/325 mg one tablet4times dailyas need for pain#85We willcontinue to monitor.05/21/2020. We will continue the opioid monitoring program, this consists of regular clinic visits, examinations, urine drug screen, pill counts as well as use of [place] Controlled Substance Reporting system. A 12 month History has been reviewed on the [place] Controlled Substance Reporting Systemon04/22/2022. 4. Muscle Spasms: Continuecurrent medication regimen withTizanidine.05/21/2020 5. Depression/anxiety:Psychiatry Following Dr. Percell Belt Continue Monthly Counseling. Continue Current Medication Regime. Xanax .05/21/2020 6. Morbid obesity:Encouraged to continue with weight Loss and Healthy Living Regimen.05/21/2020 7. Insomnia:No complaintstoday.Continueto  monitor.05/21/2020 8.RightGreater trochanter Bursitis:No complaints today.Continue Alternating Ice and Heat Therapy.Continue to monitor.05/21/2020.. 9. Polyarthralgia: Bilateral Hands:No complaints today.Continue current medication regimen. Continue to monitor.05/21/2020.  F/U in 1 month

## 2020-05-28 LAB — TOXASSURE SELECT,+ANTIDEPR,UR

## 2020-05-31 ENCOUNTER — Telehealth: Payer: Self-pay | Admitting: *Deleted

## 2020-05-31 NOTE — Telephone Encounter (Signed)
Urine drug screen for this encounter is consistent for prescribed medication 

## 2020-06-22 ENCOUNTER — Encounter: Payer: Medicare Other | Admitting: Registered Nurse

## 2020-06-25 ENCOUNTER — Other Ambulatory Visit: Payer: Self-pay | Admitting: Neurology

## 2020-06-25 DIAGNOSIS — G35 Multiple sclerosis: Secondary | ICD-10-CM

## 2020-06-29 ENCOUNTER — Encounter: Payer: Medicare Other | Attending: Physical Medicine & Rehabilitation | Admitting: Registered Nurse

## 2020-06-29 ENCOUNTER — Encounter: Payer: Self-pay | Admitting: Registered Nurse

## 2020-06-29 ENCOUNTER — Other Ambulatory Visit: Payer: Self-pay | Admitting: Physician Assistant

## 2020-06-29 ENCOUNTER — Other Ambulatory Visit: Payer: Self-pay

## 2020-06-29 VITALS — BP 116/75 | HR 80 | Temp 98.8°F | Ht 62.0 in | Wt 290.6 lb

## 2020-06-29 DIAGNOSIS — M5416 Radiculopathy, lumbar region: Secondary | ICD-10-CM | POA: Diagnosis not present

## 2020-06-29 DIAGNOSIS — M7061 Trochanteric bursitis, right hip: Secondary | ICD-10-CM | POA: Diagnosis not present

## 2020-06-29 DIAGNOSIS — M7062 Trochanteric bursitis, left hip: Secondary | ICD-10-CM | POA: Diagnosis present

## 2020-06-29 DIAGNOSIS — G894 Chronic pain syndrome: Secondary | ICD-10-CM | POA: Diagnosis not present

## 2020-06-29 DIAGNOSIS — M4316 Spondylolisthesis, lumbar region: Secondary | ICD-10-CM | POA: Diagnosis present

## 2020-06-29 DIAGNOSIS — Z5181 Encounter for therapeutic drug level monitoring: Secondary | ICD-10-CM | POA: Insufficient documentation

## 2020-06-29 DIAGNOSIS — Z1231 Encounter for screening mammogram for malignant neoplasm of breast: Secondary | ICD-10-CM

## 2020-06-29 DIAGNOSIS — Z79891 Long term (current) use of opiate analgesic: Secondary | ICD-10-CM | POA: Diagnosis present

## 2020-06-29 MED ORDER — OXYCODONE-ACETAMINOPHEN 10-325 MG PO TABS: 1.0000 | ORAL_TABLET | Freq: Four times a day (QID) | ORAL | 0 refills | Status: DC | PRN

## 2020-06-29 NOTE — Progress Notes (Signed)
Subjective:    Patient ID: Mariah Morris, female    DOB: 06-28-1970, 50 y.o.   MRN: [id number]  HPI: [name] is a 50 y.o. female who returns for follow up appointment for chronic pain and medication refill. She states her pain is located in her lower back radiating into his bilateral hips R>L and bilateral lower extremities. She rates her pain 6. Her current exercise regime is walking and performing stretching exercises.  [name] Morphine equivalent is 24.29 MME.  She is also prescribed Alprazolam by [name]. We have discussed the black box warning of using opioids and benzodiazepines. I highlighted the dangers of using these drugs together and discussed the adverse events including respiratory suppression, overdose, cognitive impairment and importance of compliance with current regimen. We will continue to monitor and adjust as indicated.  she is being closely monitored and under the care of her psychiatrist.  . Last UDS was Performed on 05/21/2020, it was consistent.     Pain Inventory Average Pain 6 Pain Right Now 6 My pain is intermittent, sharp, tingling, aching and numbness, spasm pain  In the last 24 hours, has pain interfered with the following? General activity 6 Relation with others 6 Enjoyment of life 7 What TIME of day is your pain at its worst? night Sleep (in general) Good  Pain is worse with: walking, bending, sitting, standing and some activites Pain improves with: rest, medication and ice Relief from Meds: 8  Family History  Problem Relation Age of Onset  . Hypertension Mother   . Diabetes Father   . Hypertension Father    Social History   Socioeconomic History  . Marital status: Single    Spouse name: Not on file  . Number of children: Not on file  . Years of education: Not on file  . Highest education level: Not on file  Occupational History  . Not on file  Tobacco Use  . Smoking status: Never Smoker  . Smokeless tobacco:  Never Used  Vaping Use  . Vaping Use: Never used  Substance and Sexual Activity  . Alcohol use: No  . Drug use: Not on file  . Sexual activity: Not on file  Other Topics Concern  . Not on file  Social History Narrative  . Not on file   Social Determinants of Health   Financial Resource Strain: Not on file  Food Insecurity: Not on file  Transportation Needs: Not on file  Physical Activity: Not on file  Stress: Not on file  Social Connections: Not on file   Past Surgical History:  Procedure Laterality Date  . CESAREAN SECTION     x2  . OVARIAN CYST REMOVAL     Past Surgical History:  Procedure Laterality Date  . CESAREAN SECTION     x2  . OVARIAN CYST REMOVAL     Past Medical History:  Diagnosis Date  . Chronic back pain   . GAD (generalized anxiety disorder)   . GERD (gastroesophageal reflux disease)   . Gestational diabetes   . Hypertension   . Neuromuscular disorder (HCC) 2011   MS  . Presence of permanent cardiac pacemaker   . Seizures (HCC)    There were no vitals taken for this visit.  Opioid Risk Score:   Fall Risk Score:  `1  Depression screen PHQ 2/9  Depression screen [facility] 2/9 05/21/2020 04/07/2020 12/22/2019 08/27/2019 07/29/2019 07/03/2018 08/06/2017  Decreased Interest 0 3 0 0 1 1 1   Down, Depressed,  Hopeless 0 3 0 0 1 1 1   PHQ - 2 Score 0 6 0 0 2 2 2   Altered sleeping 0 - - - - - -  Tired, decreased energy - - - - - - -  Change in appetite - - - - - - -  Feeling bad or failure about yourself  - - - - - - -  Trouble concentrating - - - - - - -  Moving slowly or fidgety/restless - - - - - - -  Suicidal thoughts - - - - - - -  PHQ-9 Score 0 - - - - - -   Review of Systems  Musculoskeletal: Positive for back pain and gait problem.       Pain in both arms  All other systems reviewed and are negative.      Objective:   Physical Exam Vitals and nursing note reviewed.  Constitutional:      Appearance: Normal appearance.  Cardiovascular:     Rate  and Rhythm: Normal rate and regular rhythm.     Pulses: Normal pulses.     Heart sounds: Normal heart sounds.  Pulmonary:     Effort: Pulmonary effort is normal.     Breath sounds: Normal breath sounds.  Musculoskeletal:     Cervical back: Normal range of motion and neck supple.     Comments: Normal Muscle Bulk and Muscle Testing Reveals:  Upper Extremities: Full ROM and Muscle Strength 5/5 Lumbar Paraspinal Tenderness: L-3-L-5 Lower Extremities: Full ROM and Muscle Strength 5/5 Bilateral Lower Extremities Flexion Produces Pain into her Bilateral Patella's Arises from Table Slowly Antalgic  Gait   Skin:    General: Skin is warm and dry.  Neurological:     Mental Status: She is alert and oriented to person, place, and time.  Psychiatric:        Mood and Affect: Mood normal.        Behavior: Behavior normal.           Assessment & Plan:  1.Multiple Sclerosis: Neurology Following: Dr. Shah.06/29/2020 2.Lumbar Spondylolisthesis/Low back pain/ Lumbar Radiculitis/ Neuropathic Pain: Continue Current exercise Regime. Continue using heat therapy. Continuecurrent medication regimen withGabapentin.06/29/2020. 3. Chronic Pain:Continue with the slow weaning.Refilled: OxyCODONE 10/325 mg one tablet4times dailyas need for pain#85We willcontinue to monitor.06/29/2020. We will continue the opioid monitoring program, this consists of regular clinic visits, examinations, urine drug screen, pill counts as well as use of 07/01/2020 Controlled Substance Reporting system. A 12 month History has been reviewed on the 07/01/2020 Controlled Substance Reporting Systemon05/31/2022. 4. Muscle Spasms: Continuecurrent medication regimen withTizanidine.06/29/2020 5. Depression/anxiety:Psychiatry Following Dr. 07/01/2020 Continue Monthly Counseling. Continue Current Medication Regime. Xanax .05/21/2020 6. Morbid obesity:Encouraged to continue with weight Loss and Healthy Living  Regimen.05/21/2020 7. Insomnia:No complaintstoday.Continueto monitor.05/21/2020 8.RightGreater trochanter Bursitis:No complaints today.Continue Alternating Ice and Heat Therapy.Continue to monitor.06/29/2020.. 9. Polyarthralgia: Bilateral Hands:No complaints today.Continue current medication regimen. Continue to monitor.06/29/2020.  F/U in 1 month

## 2020-07-09 ENCOUNTER — Ambulatory Visit
Admission: RE | Admit: 2020-07-09 | Discharge: 2020-07-09 | Disposition: A | Discharge status: Home / Self Care | Payer: Medicare Other | Source: Ambulatory Visit | Attending: Neurology | Admitting: Neurology

## 2020-07-09 ENCOUNTER — Other Ambulatory Visit: Payer: Self-pay

## 2020-07-09 DIAGNOSIS — G35 Multiple sclerosis: Secondary | ICD-10-CM | POA: Diagnosis present

## 2020-07-09 MED ORDER — GADOBUTROL 1 MMOL/ML IV SOLN
10.0000 mL | Freq: Once | INTRAVENOUS | Status: AC | PRN
  Administered 2020-07-09: 10 mL via INTRAVENOUS

## 2020-07-29 ENCOUNTER — Other Ambulatory Visit: Payer: Self-pay

## 2020-07-29 ENCOUNTER — Encounter: Payer: Medicare Other | Attending: Physical Medicine & Rehabilitation | Admitting: Registered Nurse

## 2020-07-29 VITALS — BP 123/82 | HR 71 | Temp 98.5°F | Ht 62.0 in | Wt 288.2 lb

## 2020-07-29 DIAGNOSIS — Z79891 Long term (current) use of opiate analgesic: Secondary | ICD-10-CM | POA: Diagnosis present

## 2020-07-29 DIAGNOSIS — G894 Chronic pain syndrome: Secondary | ICD-10-CM | POA: Insufficient documentation

## 2020-07-29 DIAGNOSIS — Z5181 Encounter for therapeutic drug level monitoring: Secondary | ICD-10-CM | POA: Insufficient documentation

## 2020-07-29 DIAGNOSIS — M5416 Radiculopathy, lumbar region: Secondary | ICD-10-CM | POA: Diagnosis present

## 2020-07-29 DIAGNOSIS — M4316 Spondylolisthesis, lumbar region: Secondary | ICD-10-CM | POA: Diagnosis present

## 2020-07-29 MED ORDER — OXYCODONE-ACETAMINOPHEN 10-325 MG PO TABS: 1.0000 | ORAL_TABLET | Freq: Three times a day (TID) | ORAL | 0 refills | Status: DC | PRN

## 2020-07-29 NOTE — Progress Notes (Signed)
Subjective:    Patient ID: Mariah Morris, female    DOB: Feb 24, 1970, 50 y.o.   MRN: [id number]  HPI: Mariah Morris is a 50 y.o. female who returns for follow up appointment for chronic pain and medication refill. She states her pain is located in her lower back radiating into her bilateral lower extremities.. She rates her pain 7. Her current exercise regime is attending physical therapy two days a week, walking and performing stretching exercises.   Mariah Morris equivalent is 42.50 MME.   Last UDS was Performed on 05/21/2020, it was consistent.   Pain Inventory Average Pain 7 Pain Right Now 7 My pain is constant, stabbing, tingling, and aching  In the last 24 hours, has pain interfered with the following? General activity 5 Relation with others 5 Enjoyment of life 5 What TIME of day is your pain at its worst? morning , evening, and night Sleep (in general) Fair  Pain is worse with: walking, bending, sitting, and standing Pain improves with: rest, heat/ice, therapy/exercise, and medication Relief from Meds:  not sure  Family History  Problem Relation Age of Onset   Hypertension Mother    Diabetes Father    Hypertension Father    Social History   Socioeconomic History   Marital status: Single    Spouse name: Not on file   Number of children: Not on file   Years of education: Not on file   Highest education level: Not on file  Occupational History   Not on file  Tobacco Use   Smoking status: Never   Smokeless tobacco: Never  Vaping Use   Vaping Use: Never used  Substance and Sexual Activity   Alcohol use: No   Drug use: Never   Sexual activity: Not on file  Other Topics Concern   Not on file  Social History Narrative   Not on file   Social Determinants of Health   Financial Resource Strain: Not on file  Food Insecurity: Not on file  Transportation Needs: Not on file  Physical Activity: Not on file  Stress: Not on file  Social Connections:  Not on file   Past Surgical History:  Procedure Laterality Date   CESAREAN SECTION     x2   OVARIAN CYST REMOVAL     Past Surgical History:  Procedure Laterality Date   CESAREAN SECTION     x2   OVARIAN CYST REMOVAL     Past Medical History:  Diagnosis Date   Chronic back pain    GAD (generalized anxiety disorder)    GERD (gastroesophageal reflux disease)    Gestational diabetes    Hypertension    Neuromuscular disorder (HCC) 2011   MS   Presence of permanent cardiac pacemaker    Seizures (HCC)    BP 123/82 (BP Location: Right Wrist, Patient Position: Sitting, Cuff Size: Large)   Pulse 71   Temp 98.5 F (36.9 C) (Oral)   Ht 5\' 2"  (1.575 m)   Wt 288 lb 3.2 oz (130.7 kg)   SpO2 98%   BMI 52.71 kg/m   Opioid Risk Score:   Fall Risk Score:  `1  Depression screen PHQ 2/9  Depression screen [facility] 2/9 06/29/2020 05/21/2020 04/07/2020 12/22/2019 08/27/2019 07/29/2019 07/03/2018  Decreased Interest 1 0 3 0 0 1 1  Down, Depressed, Hopeless 1 0 3 0 0 1 1  PHQ - 2 Score 2 0 6 0 0 2 2  Altered sleeping - 0 - - - - -  Tired, decreased energy - - - - - - -  Change in appetite - - - - - - -  Feeling bad or failure about yourself  - - - - - - -  Trouble concentrating - - - - - - -  Moving slowly or fidgety/restless - - - - - - -  Suicidal thoughts - - - - - - -  PHQ-9 Score - 0 - - - - -     Review of Systems  Constitutional: Negative.   HENT: Negative.    Eyes: Negative.   Respiratory: Negative.    Cardiovascular: Negative.   Gastrointestinal: Negative.   Musculoskeletal:  Positive for back pain and gait problem.       Pain in both legs  Skin: Negative.   Allergic/Immunologic: Negative.   Psychiatric/Behavioral: Negative.        Objective:   Physical Exam Vitals and nursing note reviewed.  Constitutional:      Appearance: Normal appearance.  Cardiovascular:     Rate and Rhythm: Normal rate and regular rhythm.     Pulses: Normal pulses.     Heart sounds: Normal heart  sounds.  Pulmonary:     Effort: Pulmonary effort is normal.     Breath sounds: Normal breath sounds.  Musculoskeletal:     Cervical back: Normal range of motion and neck supple.     Comments: Normal Muscle Bulk and Muscle Testing Reveals:  Upper Extremities: Full  ROM and Muscle Strength  5/5 Bilateral AC Joint tenderness  Lumbar Paraspinal Tenderness: L-3-L-5 Lower Extremities: Full ROM and Muscle Strength 5/5 Bilateral Lower Extremities Flexion Produces Pain into her Bilateral Lower Extremities Arises from Table Slowly Antalgic  Gait     Skin:    General: Skin is warm and dry.  Neurological:     Mental Status: She is alert and oriented to person, place, and time.  Psychiatric:        Mood and Affect: Mood normal.        Behavior: Behavior normal.         Assessment & Plan:  1.Multiple Sclerosis: Neurology Following: Dr. Sherryll Burger. 07/29/2020 2. Lumbar Spondylolisthesis/Low back pain/ Lumbar Radiculitis/ Neuropathic Pain: Continue Current exercise Regime. Continue using heat therapy. Continue current medication regimen with Gabapentin. 07/29/2020.  3.  Chronic Pain: Continue with the slow weaning. Refilled:  OxyCODONE 10/325 mg one tablet 4 times daily as need for pain #85 We will continue to monitor. 07/29/2020.  We will continue the opioid monitoring program, this consists of regular clinic visits, examinations, urine drug screen, pill counts as well as use of [place] Controlled Substance Reporting system. A 12 month History has been reviewed on the [place] Controlled Substance Reporting System on 07/29/2020. 4. Muscle Spasms: Continue current medication regimen with Tizanidine. 07/29/2020  5. Depression/anxiety:Psychiatry Following Dr. Percell Belt  Continue Monthly Counseling. Continue Current Medication Regime. Xanax . 07/29/2020 6. Morbid obesity:Encouraged to continue with weight Loss and Healthy Living Regimen. 07/29/2020 7. Insomnia: No complaints today. Continue to  monitor. 07/29/2020 8. Right Greater trochanter Bursitis: No complaints today. Continue Alternating Ice and Heat Therapy. Continue to monitor. 07/29/2020.. 9. Polyarthralgia: Bilateral Hands: No complaints today. Continue current medication regimen. Continue to monitor. 07/29/2020.   F/U in 1 month

## 2020-08-06 ENCOUNTER — Encounter: Payer: Self-pay | Admitting: Registered Nurse

## 2020-08-30 ENCOUNTER — Encounter: Payer: Medicare Other | Admitting: Registered Nurse

## 2020-09-02 ENCOUNTER — Other Ambulatory Visit: Payer: Self-pay

## 2020-09-02 ENCOUNTER — Encounter: Payer: Self-pay | Admitting: Registered Nurse

## 2020-09-02 ENCOUNTER — Encounter: Payer: Medicare Other | Attending: Physical Medicine & Rehabilitation | Admitting: Registered Nurse

## 2020-09-02 VITALS — BP 104/72 | HR 71 | Temp 98.7°F | Ht 62.0 in | Wt 287.4 lb

## 2020-09-02 DIAGNOSIS — G894 Chronic pain syndrome: Secondary | ICD-10-CM | POA: Diagnosis present

## 2020-09-02 DIAGNOSIS — Z79891 Long term (current) use of opiate analgesic: Secondary | ICD-10-CM | POA: Insufficient documentation

## 2020-09-02 DIAGNOSIS — M4316 Spondylolisthesis, lumbar region: Secondary | ICD-10-CM | POA: Diagnosis present

## 2020-09-02 DIAGNOSIS — M5416 Radiculopathy, lumbar region: Secondary | ICD-10-CM | POA: Diagnosis present

## 2020-09-02 DIAGNOSIS — Z5181 Encounter for therapeutic drug level monitoring: Secondary | ICD-10-CM | POA: Insufficient documentation

## 2020-09-02 DIAGNOSIS — G35 Multiple sclerosis: Secondary | ICD-10-CM | POA: Insufficient documentation

## 2020-09-02 MED ORDER — OXYCODONE-ACETAMINOPHEN 10-325 MG PO TABS: 1.0000 | ORAL_TABLET | Freq: Three times a day (TID) | ORAL | 0 refills | Status: DC | PRN

## 2020-09-02 NOTE — Progress Notes (Signed)
Subjective:    Patient ID: Mariah Morris, female    DOB: 1970/06/30, 50 y.o.   MRN: [id number]  HPI: Mariah Morris is a 50 y.o. female who returns for follow up appointment for chronic pain and medication refill. She states her pain is located in  her lower back and bilateral lower extremities. She rates her pain 6. Her current exercise regime is receiving physical therapy weekly,  walking and performing stretching exercises.  Ms. Hodapp Morphine equivalent is 40.00 MME. Ms. Bresnan tolerating the slow weaning, we will continue to monitor. She  is also prescribed Alprazolam  by [name] .We have discussed the black box warning of using opioids and benzodiazepines. I highlighted the dangers of using these drugs together and discussed the adverse events including respiratory suppression, overdose, cognitive impairment and importance of compliance with current regimen. We will continue to monitor and adjust as indicated.  she is being closely monitored and under the care of her psychiatrist.     Last UDS was Performed on 05/21/2020, it was consistent.     Pain Inventory Average Pain 6 Pain Right Now 6 My pain is constant, sharp, burning, tingling, and aching  In the last 24 hours, has pain interfered with the following? General activity 5 Relation with others 5 Enjoyment of life 6 What TIME of day is your pain at its worst? morning , evening, and night Sleep (in general) Fair  Pain is worse with: bending, sitting, standing, and some activites Pain improves with: rest, heat/ice, therapy/exercise, and medication Relief from Meds: 7  Family History  Problem Relation Age of Onset   Hypertension Mother    Diabetes Father    Hypertension Father    Social History   Socioeconomic History   Marital status: Single    Spouse name: Not on file   Number of children: Not on file   Years of education: Not on file   Highest education level: Not on file  Occupational History    Not on file  Tobacco Use   Smoking status: Never   Smokeless tobacco: Never  Vaping Use   Vaping Use: Never used  Substance and Sexual Activity   Alcohol use: No   Drug use: Never   Sexual activity: Not on file  Other Topics Concern   Not on file  Social History Narrative   Not on file   Social Determinants of Health   Financial Resource Strain: Not on file  Food Insecurity: Not on file  Transportation Needs: Not on file  Physical Activity: Not on file  Stress: Not on file  Social Connections: Not on file   Past Surgical History:  Procedure Laterality Date   CESAREAN SECTION     x2   OVARIAN CYST REMOVAL     Past Surgical History:  Procedure Laterality Date   CESAREAN SECTION     x2   OVARIAN CYST REMOVAL     Past Medical History:  Diagnosis Date   Chronic back pain    GAD (generalized anxiety disorder)    GERD (gastroesophageal reflux disease)    Gestational diabetes    Hypertension    Neuromuscular disorder (HCC) 2011   MS   Presence of permanent cardiac pacemaker    Seizures (HCC)    BP 104/72 (BP Location: Right Arm, Patient Position: Sitting, Cuff Size: Large)   Pulse 71   Temp 98.7 F (37.1 C) (Oral)   Ht 5\' 2"  (1.575 m)   Wt 287 lb 6.4  oz (130.4 kg)   SpO2 98%   BMI 52.57 kg/m   Opioid Risk Score:   Fall Risk Score:  `1  Depression screen PHQ 2/9  Depression screen [facility] 2/9 06/29/2020 05/21/2020 04/07/2020 12/22/2019 08/27/2019 07/29/2019 07/03/2018  Decreased Interest 1 0 3 0 0 1 1  Down, Depressed, Hopeless 1 0 3 0 0 1 1  PHQ - 2 Score 2 0 6 0 0 2 2  Altered sleeping - 0 - - - - -  Tired, decreased energy - - - - - - -  Change in appetite - - - - - - -  Feeling bad or failure about yourself  - - - - - - -  Trouble concentrating - - - - - - -  Moving slowly or fidgety/restless - - - - - - -  Suicidal thoughts - - - - - - -  PHQ-9 Score - 0 - - - - -     Review of Systems  Musculoskeletal:  Positive for back pain.       Bilateral elbow  pain Bilateral knee pain Bilateral shine pain  All other systems reviewed and are negative.     Objective:   Physical Exam Vitals and nursing note reviewed.  Constitutional:      Appearance: Normal appearance. She is obese.  Cardiovascular:     Rate and Rhythm: Normal rate and regular rhythm.     Pulses: Normal pulses.     Heart sounds: Normal heart sounds.  Musculoskeletal:     Cervical back: Normal range of motion and neck supple.     Comments: Normal Muscle Bulk and Muscle Testing Reveals:  Upper Extremities: Full ROM and Muscle Strength 5/5  Lumbar Paraspinal Tenderness: L-4-L-5 Lower Extremities: Decreased ROM and Muscle Strength 5/5 Bilateral Lower Extremities Flexion Produces Pain into her Bilateral Lower Extremities and Bilateral Patellas Arises from Table Slowly Antalgic  Gait     Skin:    General: Skin is warm and dry.  Neurological:     Mental Status: She is alert and oriented to person, place, and time.  Psychiatric:        Mood and Affect: Mood normal.        Behavior: Behavior normal.         Assessment & Plan:  1.Multiple Sclerosis: Neurology Following: Dr. Sherryll Burger. 09/02/2020 2. Lumbar Spondylolisthesis/Low back pain/ Lumbar Radiculitis/ Neuropathic Pain: Continue Current exercise Regime. Continue using heat therapy. Continue current medication regimen with Gabapentin. 09/02/2020.  3.  Chronic Pain: Continue with the slow weaning. Tolerating slow weaning. Refilled:  OxyCODONE 10/325 mg one tablet 3 times daily as need for pain #80 We will continue to monitor. 09/02/2020.  We will continue the opioid monitoring program, this consists of regular clinic visits, examinations, urine drug screen, pill counts as well as use of [place] Controlled Substance Reporting system. A 12 month History has been reviewed on the [place] Controlled Substance Reporting System on 09/02/2020. 4. Muscle Spasms: Continue current medication regimen with Tizanidine.  09/02/2020  5. Depression/anxiety:Psychiatry Following Dr. Percell Belt  Continue Monthly Counseling. Continue Current Medication Regime. Xanax . 09/02/2020 6. Morbid obesity:Encouraged to continue with weight Loss and Healthy Living Regimen. 09/02/2020 7. Insomnia: No complaints today. Continue to monitor. 09/02/2020 8. Right Greater trochanter Bursitis: No complaints today. Continue Alternating Ice and Heat Therapy. Continue to monitor. 09/02/2020.. 9. Polyarthralgia: Bilateral Hands: No complaints today. Continue current medication regimen. Continue to monitor. 09/02/2020.   F/U in 6 weeks

## 2020-10-11 ENCOUNTER — Encounter: Payer: Medicare Other | Attending: Physical Medicine & Rehabilitation | Admitting: Registered Nurse

## 2020-10-11 DIAGNOSIS — M4316 Spondylolisthesis, lumbar region: Secondary | ICD-10-CM | POA: Insufficient documentation

## 2020-10-11 DIAGNOSIS — Z5181 Encounter for therapeutic drug level monitoring: Secondary | ICD-10-CM | POA: Insufficient documentation

## 2020-10-11 DIAGNOSIS — Z79891 Long term (current) use of opiate analgesic: Secondary | ICD-10-CM | POA: Insufficient documentation

## 2020-10-11 DIAGNOSIS — G894 Chronic pain syndrome: Secondary | ICD-10-CM | POA: Insufficient documentation

## 2020-10-11 DIAGNOSIS — M5416 Radiculopathy, lumbar region: Secondary | ICD-10-CM | POA: Insufficient documentation

## 2020-10-11 DIAGNOSIS — G35 Multiple sclerosis: Secondary | ICD-10-CM | POA: Insufficient documentation

## 2020-10-28 ENCOUNTER — Encounter: Payer: Self-pay | Admitting: Registered Nurse

## 2020-10-28 ENCOUNTER — Other Ambulatory Visit: Payer: Self-pay

## 2020-10-28 ENCOUNTER — Encounter: Payer: Medicare Other | Admitting: Registered Nurse

## 2020-10-28 VITALS — BP 124/85 | HR 74 | Temp 98.1°F | Ht 62.0 in | Wt 286.4 lb

## 2020-10-28 DIAGNOSIS — G894 Chronic pain syndrome: Secondary | ICD-10-CM

## 2020-10-28 DIAGNOSIS — M4316 Spondylolisthesis, lumbar region: Secondary | ICD-10-CM

## 2020-10-28 DIAGNOSIS — G35 Multiple sclerosis: Secondary | ICD-10-CM | POA: Diagnosis present

## 2020-10-28 DIAGNOSIS — M5416 Radiculopathy, lumbar region: Secondary | ICD-10-CM

## 2020-10-28 DIAGNOSIS — Z5181 Encounter for therapeutic drug level monitoring: Secondary | ICD-10-CM | POA: Diagnosis not present

## 2020-10-28 DIAGNOSIS — Z79891 Long term (current) use of opiate analgesic: Secondary | ICD-10-CM | POA: Diagnosis not present

## 2020-10-28 MED ORDER — OXYCODONE-ACETAMINOPHEN 10-325 MG PO TABS: 1.0000 | ORAL_TABLET | Freq: Three times a day (TID) | ORAL | 0 refills | Status: DC | PRN

## 2020-10-28 NOTE — Progress Notes (Signed)
Subjective:    Patient ID: Mariah Morris, female    DOB: 07/19/1970, 50 y.o.   MRN: [id number]  HPI: Mariah Morris is a 50 y.o. female who returns for follow up appointment for chronic pain and medication refill. She states her pain is located in   lower back radiating into her bilateral lower extremities.. She rates her pain 6. Her current exercise regime is walking and performing stretching exercises.  Mariah Morris is 44.44 MME.   UDS ordered today.     Pain Inventory Average Pain 6 Pain Right Now 6 My pain is constant, tingling, and aching  In the last 24 hours, has pain interfered with the following? General activity 5 Relation with others 5 Enjoyment of life 5 What TIME of day is your pain at its worst? evening and night Sleep (in general) NA  Pain is worse with: walking, bending, sitting, and standing Pain improves with: rest, heat/ice, therapy/exercise, pacing activities, and medication Relief from Meds: 7  Family History  Problem Relation Age of Onset   Hypertension Mother    Diabetes Father    Hypertension Father    Social History   Socioeconomic History   Marital status: Single    Spouse name: Not on file   Number of children: Not on file   Years of education: Not on file   Highest education level: Not on file  Occupational History   Not on file  Tobacco Use   Smoking status: Never   Smokeless tobacco: Never  Vaping Use   Vaping Use: Never used  Substance and Sexual Activity   Alcohol use: No   Drug use: Never   Sexual activity: Not on file  Other Topics Concern   Not on file  Social History Narrative   Not on file   Social Determinants of Health   Financial Resource Strain: Not on file  Food Insecurity: Not on file  Transportation Needs: Not on file  Physical Activity: Not on file  Stress: Not on file  Social Connections: Not on file   Past Surgical History:  Procedure Laterality Date   CESAREAN SECTION     x2    OVARIAN CYST REMOVAL     Past Surgical History:  Procedure Laterality Date   CESAREAN SECTION     x2   OVARIAN CYST REMOVAL     Past Medical History:  Diagnosis Date   Chronic back pain    GAD (generalized anxiety disorder)    GERD (gastroesophageal reflux disease)    Gestational diabetes    Hypertension    Neuromuscular disorder (HCC) 2011   MS   Presence of permanent cardiac pacemaker    Seizures (HCC)    BP 124/85   Pulse 74   Temp 98.1 F (36.7 C)   Ht 5\' 2"  (1.575 m)   Wt 286 lb 6.4 oz (129.9 kg)   SpO2 99%   BMI 52.38 kg/m   Opioid Risk Score:   Fall Risk Score:  `1  Depression screen PHQ 2/9  Depression screen [facility] 2/9 10/28/2020 06/29/2020 05/21/2020 04/07/2020 12/22/2019 08/27/2019 07/29/2019  Decreased Interest 1 1 0 3 0 0 1  Down, Depressed, Hopeless 1 1 0 3 0 0 1  PHQ - 2 Score 2 2 0 6 0 0 2  Altered sleeping - - 0 - - - -  Tired, decreased energy - - - - - - -  Change in appetite - - - - - - -  Feeling bad or failure about yourself  - - - - - - -  Trouble concentrating - - - - - - -  Moving slowly or fidgety/restless - - - - - - -  Suicidal thoughts - - - - - - -  PHQ-9 Score - - 0 - - - -     Review of Systems  Constitutional: Negative.   HENT: Negative.    Eyes: Negative.   Respiratory: Negative.    Cardiovascular: Negative.   Gastrointestinal: Negative.   Endocrine: Negative.   Genitourinary: Negative.   Musculoskeletal:  Positive for back pain.       Arm pain bil and leg pain bil  Skin: Negative.   Allergic/Immunologic: Negative.   Neurological:        Tingling  Hematological: Negative.   Psychiatric/Behavioral:  Positive for dysphoric mood.   All other systems reviewed and are negative.     Objective:   Physical Exam Vitals and nursing note reviewed.  Constitutional:      Appearance: Normal appearance. She is obese.  Cardiovascular:     Rate and Rhythm: Normal rate and regular rhythm.     Pulses: Normal pulses.     Heart  sounds: Normal heart sounds.  Musculoskeletal:     Cervical back: Normal range of motion and neck supple.     Comments: Normal Muscle Bulk and Muscle Testing Reveals:  Upper Extremities: Full ROM and Muscle Strength 5/5  Lumbar Paraspinal Tenderness: L-3-L-5 Lower Extremities: Right: Decreased ROM and Muscle Strength 5/5 Right Lower Extremity Flexion Produces Pain into her lower extremity Left Lower Extremity: Full ROM and Muscle Strength 5/5 Left Lower Extremity Flexion Produces Pain into her Left Patella Arises from Table Slowly Antalgic  Gait     Skin:    General: Skin is warm and dry.  Neurological:     Mental Status: She is alert and oriented to person, place, and time.  Psychiatric:        Mood and Affect: Mood normal.        Behavior: Behavior normal.         Assessment & Plan:  1.Multiple Sclerosis: Neurology Following: Dr. Sherryll Burger. 10/28/2020 2. Lumbar Spondylolisthesis/Low back pain/ Lumbar Radiculitis/ Neuropathic Pain: Continue Current exercise Regime. Continue using heat therapy. Continue current medication regimen with Gabapentin. 10/28/2020.  3.  Chronic Pain: Continue with the slow weaning. Tolerating slow weaning. Refilled:  OxyCODONE 10/325 mg one tablet 3 times daily as need for pain #80 We will continue to monitor. 10/28/2020.  We will continue the opioid monitoring program, this consists of regular clinic visits, examinations, urine drug screen, pill counts as well as use of [place] Controlled Substance Reporting system. A 12 month History has been reviewed on the [place] Controlled Substance Reporting System on 10/28/2020. 4. Muscle Spasms: Continue current medication regimen with Tizanidine. 10/28/2020  5. Depression/anxiety:Psychiatry Following Dr. Percell Belt  Continue Monthly Counseling. Continue Current Medication Regime. Xanax . 0929/2022 6. Morbid obesity:Encouraged to continue with weight Loss and Healthy Living Regimen. 10/28/2020 7. Insomnia: No  complaints today. Continue to monitor. 09/029/2022 8. Right Greater trochanter Bursitis: No complaints today. Continue Alternating Ice and Heat Therapy. Continue to monitor. 10/28/2020.. 9. Polyarthralgia: Bilateral Hands: No complaints today. Continue current medication regimen. Continue to monitor. 10/28/2020.   F/U in 6 weeks

## 2020-11-04 ENCOUNTER — Telehealth: Payer: Self-pay | Admitting: *Deleted

## 2020-11-04 LAB — TOXASSURE SELECT,+ANTIDEPR,UR

## 2020-11-04 NOTE — Telephone Encounter (Signed)
Urine drug screen for this encounter is consistent for prescribed medication 

## 2020-12-09 ENCOUNTER — Encounter: Payer: Self-pay | Admitting: Registered Nurse

## 2020-12-09 ENCOUNTER — Other Ambulatory Visit: Payer: Self-pay

## 2020-12-09 ENCOUNTER — Encounter: Payer: Medicare Other | Attending: Physical Medicine & Rehabilitation | Admitting: Registered Nurse

## 2020-12-09 VITALS — BP 120/79 | HR 83 | Ht 62.0 in | Wt 286.6 lb

## 2020-12-09 DIAGNOSIS — M5416 Radiculopathy, lumbar region: Secondary | ICD-10-CM

## 2020-12-09 DIAGNOSIS — Z5181 Encounter for therapeutic drug level monitoring: Secondary | ICD-10-CM | POA: Diagnosis present

## 2020-12-09 DIAGNOSIS — M4316 Spondylolisthesis, lumbar region: Secondary | ICD-10-CM

## 2020-12-09 DIAGNOSIS — Z79891 Long term (current) use of opiate analgesic: Secondary | ICD-10-CM

## 2020-12-09 DIAGNOSIS — M25511 Pain in right shoulder: Secondary | ICD-10-CM | POA: Diagnosis present

## 2020-12-09 DIAGNOSIS — M25512 Pain in left shoulder: Secondary | ICD-10-CM

## 2020-12-09 DIAGNOSIS — G894 Chronic pain syndrome: Secondary | ICD-10-CM | POA: Diagnosis not present

## 2020-12-09 DIAGNOSIS — G8929 Other chronic pain: Secondary | ICD-10-CM | POA: Diagnosis present

## 2020-12-09 MED ORDER — OXYCODONE-ACETAMINOPHEN 10-325 MG PO TABS: 1.0000 | ORAL_TABLET | Freq: Three times a day (TID) | ORAL | 0 refills | Status: DC | PRN

## 2020-12-09 NOTE — Progress Notes (Signed)
Subjective:    Patient ID: Mariah Morris, female    DOB: [birth year], 50 y.o.   MRN: [id number]  HPI: Mariah Morris is a 50 y.o. female who returns for follow up appointment for chronic pain and medication refill. She states her pain is located in her bilateral shoulders, lower back pain radiating into her bilateral lower extremities. She also reports generalized joint pain.  She rates her pain 8. Her current exercise regime is walking and performing stretching exercises.  Mariah Morris states " she thinks she has the flu", she has muscle aches and experiencing feeling cold. She was instructed to call her PCP, she states she will call today.   Mariah Morris Morphine equivalent is 22.22 MME.  She is also prescribed Alprazolam by [name] . We have discussed the black box warning of using opioids and benzodiazepines. I highlighted the dangers of using these drugs together and discussed the adverse events including respiratory suppression, overdose, cognitive impairment and importance of compliance with current regimen. We will continue to monitor and adjust as indicated.  She is being closely monitored and under the care of her psychiatrist.    Last UDS was Performed on 10/28/2020, it was consistent.     Pain Inventory Average Pain 8 Pain Right Now 8 My pain is intermittent, sharp, burning, stabbing, tingling, and aching  In the last 24 hours, has pain interfered with the following? General activity 5 Relation with others 5 Enjoyment of life 5 What TIME of day is your pain at its worst? morning , evening, and night Sleep (in general) Fair  Pain is worse with: walking, bending, sitting, and standing Pain improves with: rest, heat/ice, therapy/exercise, and medication Relief from Meds: 7  Family History  Problem Relation Age of Onset   Hypertension Mother    Diabetes Father    Hypertension Father    Social History   Socioeconomic History   Marital status: Single     Spouse name: Not on file   Number of children: Not on file   Years of education: Not on file   Highest education level: Not on file  Occupational History   Not on file  Tobacco Use   Smoking status: Never   Smokeless tobacco: Never  Vaping Use   Vaping Use: Never used  Substance and Sexual Activity   Alcohol use: No   Drug use: Never   Sexual activity: Not on file  Other Topics Concern   Not on file  Social History Narrative   Not on file   Social Determinants of Health   Financial Resource Strain: Not on file  Food Insecurity: Not on file  Transportation Needs: Not on file  Physical Activity: Not on file  Stress: Not on file  Social Connections: Not on file   Past Surgical History:  Procedure Laterality Date   CESAREAN SECTION     x2   OVARIAN CYST REMOVAL     Past Surgical History:  Procedure Laterality Date   CESAREAN SECTION     x2   OVARIAN CYST REMOVAL     Past Medical History:  Diagnosis Date   Chronic back pain    GAD (generalized anxiety disorder)    GERD (gastroesophageal reflux disease)    Gestational diabetes    Hypertension    Neuromuscular disorder (HCC) 2011   MS   Presence of permanent cardiac pacemaker    Seizures (HCC)    Ht 5\' 2"  (1.575 m)   Wt 286 lb  9.6 oz (130 kg)   BMI 52.42 kg/m   Opioid Risk Score:   Fall Risk Score:  `1  Depression screen PHQ 2/9  Depression screen [facility] 2/9 12/09/2020 10/28/2020 06/29/2020 05/21/2020 04/07/2020 12/22/2019 08/27/2019  Decreased Interest 0 1 1 0 3 0 0  Down, Depressed, Hopeless 0 1 1 0 3 0 0  PHQ - 2 Score 0 2 2 0 6 0 0  Altered sleeping - - - 0 - - -  Tired, decreased energy - - - - - - -  Change in appetite - - - - - - -  Feeling bad or failure about yourself  - - - - - - -  Trouble concentrating - - - - - - -  Moving slowly or fidgety/restless - - - - - - -  Suicidal thoughts - - - - - - -  PHQ-9 Score - - - 0 - - -     Review of Systems  Constitutional: Negative.   HENT: Negative.     Eyes: Negative.   Respiratory: Negative.    Cardiovascular: Negative.   Gastrointestinal: Negative.   Endocrine: Negative.   Genitourinary: Negative.   Musculoskeletal:  Positive for back pain and gait problem.  Skin: Negative.   Allergic/Immunologic: Negative.   Hematological: Negative.   Psychiatric/Behavioral: Negative.        Objective:   Physical Exam Vitals and nursing note reviewed.  Constitutional:      Appearance: Normal appearance.  Cardiovascular:     Rate and Rhythm: Normal rate and regular rhythm.     Pulses: Normal pulses.     Heart sounds: Normal heart sounds.  Pulmonary:     Effort: Pulmonary effort is normal.     Breath sounds: Normal breath sounds.  Musculoskeletal:     Cervical back: Normal range of motion and neck supple.     Comments: Normal Muscle Bulk and Muscle Testing Reveals:  Upper Extremities: Full ROM and Muscle Strength  5/5  Lumbar Paraspinal Tenderness: L-3-L--5 Lower Extremities: Decreased ROM and Muscle Strength 5/5 Bilateral Lower Extremities Flexion Produces Pain into Her Bilateral Lower Extremities Arises from Table slowly  Antalgic  Gait     Skin:    General: Skin is warm and dry.  Neurological:     Mental Status: She is alert and oriented to person, place, and time.  Psychiatric:        Mood and Affect: Mood normal.        Behavior: Behavior normal.         Assessment & Plan:  1.Multiple Sclerosis: Neurology Following: Dr. Sherryll Burger. 12/09/2020 2. Lumbar Spondylolisthesis/Low back pain/ Lumbar Radiculitis/ Neuropathic Pain: Continue Current exercise Regime. Continue using heat therapy. Continue current medication regimen with Gabapentin. 12/09/2020.  3.  Chronic Pain: Continue with the slow weaning. Tolerating slow weaning. Refilled:  OxyCODONE 10/325 mg one tablet 3 times daily as need for pain #80 We will continue to monitor. 12/09/2020.  We will continue the opioid monitoring program, this consists of regular clinic visits,  examinations, urine drug screen, pill counts as well as use of [place] Controlled Substance Reporting system. A 12 month History has been reviewed on the [place] Controlled Substance Reporting System on 12/09/2020. 4. Muscle Spasms: Continue current medication regimen with Tizanidine. 12/09/2020  5. Depression/anxiety:Psychiatry Following Dr. Percell Belt  Continue Monthly Counseling. Continue Current Medication Regime. Xanax . 12/09/2020 6. Morbid obesity:Encouraged to continue with weight Loss and Healthy Living Regimen. 12/09/2020 7. Insomnia: No complaints today.  Continue to monitor. 12/09/2020 8. Right Greater trochanter Bursitis: No complaints today. Continue Alternating Ice and Heat Therapy. Continue to monitor. 12/09/2020.. 9. Polyarthralgia:  Continue current medication regimen. Continue to monitor. 12/09/2020.   F/U in 2 months

## 2021-01-13 ENCOUNTER — Telehealth: Payer: Self-pay | Admitting: Registered Nurse

## 2021-01-13 ENCOUNTER — Other Ambulatory Visit: Payer: Self-pay | Admitting: Registered Nurse

## 2021-01-13 DIAGNOSIS — M4316 Spondylolisthesis, lumbar region: Secondary | ICD-10-CM

## 2021-01-13 MED ORDER — OXYCODONE-ACETAMINOPHEN 10-325 MG PO TABS: 1.0000 | ORAL_TABLET | Freq: Three times a day (TID) | ORAL | 0 refills | Status: DC | PRN

## 2021-01-13 NOTE — Telephone Encounter (Signed)
Oxycodone was ordered on 12/09/2020, has she called the pharmacy regarding the prescription.

## 2021-01-13 NOTE — Addendum Note (Signed)
Addended by: Jones Bales on: 01/13/2021 01:08 PM   Modules accepted: Orders

## 2021-01-13 NOTE — Addendum Note (Signed)
Addended by: Doreene Eland on: 01/13/2021 09:23 AM   Modules accepted: Orders

## 2021-01-13 NOTE — Telephone Encounter (Signed)
PMP ws Reviewed.  Oxycodone e-scribed today.  Mariah Morris is aware of the above

## 2021-01-13 NOTE — Telephone Encounter (Signed)
Patient states she is running out of meds in need of refill.  Has apt scheduled for 02/03/21

## 2021-01-18 ENCOUNTER — Telehealth: Payer: Self-pay

## 2021-01-18 DIAGNOSIS — M4316 Spondylolisthesis, lumbar region: Secondary | ICD-10-CM

## 2021-01-18 MED ORDER — OXYCODONE-ACETAMINOPHEN 10-325 MG PO TABS: 1.0000 | ORAL_TABLET | Freq: Three times a day (TID) | ORAL | 0 refills | Status: DC | PRN

## 2021-01-18 NOTE — Addendum Note (Signed)
Addended by: Jones Bales on: 01/18/2021 01:13 PM   Modules accepted: Orders

## 2021-01-18 NOTE — Telephone Encounter (Signed)
Patient called wanting a refill on pain medication. She states she wants it sent to [facility]. Prescription at [facility] has been cancelled

## 2021-01-18 NOTE — Telephone Encounter (Signed)
PMP was Reviewed.  Oxycodone e-scribed today.  

## 2021-02-03 ENCOUNTER — Encounter: Payer: Commercial Managed Care - HMO | Attending: Physical Medicine & Rehabilitation | Admitting: Registered Nurse

## 2021-02-03 ENCOUNTER — Other Ambulatory Visit: Payer: Self-pay

## 2021-02-03 ENCOUNTER — Encounter: Payer: Self-pay | Admitting: Registered Nurse

## 2021-02-03 VITALS — BP 119/78 | HR 77 | Temp 99.0°F | Ht 62.0 in | Wt 285.2 lb

## 2021-02-03 DIAGNOSIS — G894 Chronic pain syndrome: Secondary | ICD-10-CM | POA: Insufficient documentation

## 2021-02-03 DIAGNOSIS — M2550 Pain in unspecified joint: Secondary | ICD-10-CM | POA: Diagnosis not present

## 2021-02-03 DIAGNOSIS — M4316 Spondylolisthesis, lumbar region: Secondary | ICD-10-CM | POA: Diagnosis present

## 2021-02-03 DIAGNOSIS — G35 Multiple sclerosis: Secondary | ICD-10-CM | POA: Insufficient documentation

## 2021-02-03 DIAGNOSIS — Z5181 Encounter for therapeutic drug level monitoring: Secondary | ICD-10-CM | POA: Insufficient documentation

## 2021-02-03 DIAGNOSIS — Z79891 Long term (current) use of opiate analgesic: Secondary | ICD-10-CM | POA: Diagnosis not present

## 2021-02-03 DIAGNOSIS — M5416 Radiculopathy, lumbar region: Secondary | ICD-10-CM | POA: Diagnosis present

## 2021-02-03 DIAGNOSIS — K5909 Other constipation: Secondary | ICD-10-CM | POA: Insufficient documentation

## 2021-02-03 MED ORDER — OXYCODONE-ACETAMINOPHEN 10-325 MG PO TABS: 1.0000 | ORAL_TABLET | Freq: Three times a day (TID) | ORAL | 0 refills | Status: DC | PRN

## 2021-02-03 MED ORDER — SENNOSIDES-DOCUSATE SODIUM 8.6-50 MG PO TABS: 1.0000 | ORAL_TABLET | Freq: Every day | ORAL | 0 refills | Status: AC

## 2021-02-03 NOTE — Progress Notes (Signed)
Subjective:    Patient ID: Mariah Morris, female    DOB: Jan 26, 1971, 51 y.o.   MRN: [id number]  HPI: Mariah Morris is a 51 y.o. female who returns for follow up appointment for chronic pain and medication refill. She states her pain is located in her lower back radiating into her bilateral lower extremities. Also reports she has generalized joint pain. She rates her pain 7. Her current exercise regime is walking she was encouraged to increase her HEP as tolerated.  Mariah Morris reports she is constipated today, also states she hasn't been drinking water. Ordered Senna today, she will F/U with her PCP. She verbalizes understanding.   Mariah Morris Morphine equivalent is 44.44 MME. She is also prescribed Alprazolam  by [name] .We have discussed the black box warning of using opioids and benzodiazepines. I highlighted the dangers of using these drugs together and discussed the adverse events including respiratory suppression, overdose, cognitive impairment and importance of compliance with current regimen. We will continue to monitor and adjust as indicated.  She  is being closely monitored and under the care of her psychiatrist.   Last UDS was Performed on 10/28/2020, it was consistent.        Pain Inventory Average Pain 7 Pain Right Now 7 My pain is constant and aching  In the last 24 hours, has pain interfered with the following? General activity 5 Relation with others 5 Enjoyment of life 5 What TIME of day is your pain at its worst? morning , evening, and night Sleep (in general) Fair  Pain is worse with: walking, bending, sitting, standing, and some activites Pain improves with: rest, heat/ice, therapy/exercise, and medication Relief from Meds: 7  Family History  Problem Relation Age of Onset   Hypertension Mother    Diabetes Father    Hypertension Father    Social History   Socioeconomic History   Marital status: Single    Spouse name: Not on file   Number of  children: Not on file   Years of education: Not on file   Highest education level: Not on file  Occupational History   Not on file  Tobacco Use   Smoking status: Never   Smokeless tobacco: Never  Vaping Use   Vaping Use: Never used  Substance and Sexual Activity   Alcohol use: No   Drug use: Never   Sexual activity: Not on file  Other Topics Concern   Not on file  Social History Narrative   Not on file   Social Determinants of Health   Financial Resource Strain: Not on file  Food Insecurity: Not on file  Transportation Needs: Not on file  Physical Activity: Not on file  Stress: Not on file  Social Connections: Not on file   Past Surgical History:  Procedure Laterality Date   CESAREAN SECTION     x2   OVARIAN CYST REMOVAL     Past Surgical History:  Procedure Laterality Date   CESAREAN SECTION     x2   OVARIAN CYST REMOVAL     Past Medical History:  Diagnosis Date   Chronic back pain    GAD (generalized anxiety disorder)    GERD (gastroesophageal reflux disease)    Gestational diabetes    Hypertension    Neuromuscular disorder (HCC) 2011   MS   Presence of permanent cardiac pacemaker    Seizures (HCC)    BP 119/78    Pulse 77    Temp 99 F (  37.2 C)    Ht  (1.575 m)    Wt 285 lb 3.2 oz (129.4 kg)    SpO2 99%    BMI 52.16 kg/m   Opioid Risk Score:   Fall Risk Score:  `1  Depression screen PHQ 2/9  Depression screen [facility] 2/9 12/09/2020 10/28/2020 06/29/2020 05/21/2020 04/07/2020 12/22/2019 08/27/2019  Decreased Interest 0 1 1 0 3 0 0  Down, Depressed, Hopeless 0 1 1 0 3 0 0  PHQ - 2 Score 0 2 2 0 6 0 0  Altered sleeping - - - 0 - - -  Tired, decreased energy - - - - - - -  Change in appetite - - - - - - -  Feeling bad or failure about yourself  - - - - - - -  Trouble concentrating - - - - - - -  Moving slowly or fidgety/restless - - - - - - -  Suicidal thoughts - - - - - - -  PHQ-9 Score - - - 0 - - -     Review of Systems  Constitutional:  Negative.   HENT: Negative.    Eyes: Negative.   Respiratory: Negative.    Cardiovascular: Negative.   Gastrointestinal: Negative.   Endocrine: Negative.   Genitourinary: Negative.   Musculoskeletal:  Positive for back pain.  Skin: Negative.   Allergic/Immunologic: Negative.   Neurological: Negative.   Hematological: Negative.   Psychiatric/Behavioral: Negative.        Objective:   Physical Exam Vitals and nursing note reviewed.  Constitutional:      Appearance: Normal appearance. She is obese.  Cardiovascular:     Rate and Rhythm: Normal rate and regular rhythm.     Pulses: Normal pulses.     Heart sounds: Normal heart sounds.  Pulmonary:     Effort: Pulmonary effort is normal.     Breath sounds: Normal breath sounds.  Musculoskeletal:     Cervical back: Normal range of motion and neck supple.     Comments: Normal Muscle Bulk and Muscle Testing Reveals:  Upper Extremities: Full ROM and Muscle Strength 5/5 Thoracic Paraspinal Tenderness: T-7-T-9 Lumbar Paraspinal Tenderness: L-3-L-5 Lower Extremities: Full ROM and Muscle Strength 5/5 Arises from Table Slowly Antalgic  Gait     Skin:    General: Skin is warm and dry.  Neurological:     Mental Status: She is alert and oriented to person, place, and time.  Psychiatric:        Mood and Affect: Mood normal.        Behavior: Behavior normal.         Assessment & Plan:  1.Multiple Sclerosis: Neurology Following: Dr. Sherryll Burger. 02/03/2021 2. Lumbar Spondylolisthesis/Low back pain/ Lumbar Radiculitis/ Neuropathic Pain: Continue Current exercise Regime. Continue using heat therapy. Continue current medication regimen with Gabapentin. 02/03/2021  3.  Chronic Pain: Continue with the slow weaning. Tolerating slow weaning. Refilled:  OxyCODONE 10/325 mg one tablet 3 times daily as need for pain #80 We will continue to monitor. Second script sent for the following month. 02/03/2021.  We will continue the opioid monitoring program,  this consists of regular clinic visits, examinations, urine drug screen, pill counts as well as use of [place] Controlled Substance Reporting system. A 12 month History has been reviewed on the [place] Controlled Substance Reporting System on 02/03/2021 4. Muscle Spasms: Continue current medication regimen with Tizanidine. 02/03/2021  5. Depression/anxiety:Psychiatry Following Dr. Percell Belt  Continue Monthly Counseling. Continue Current  Medication Regime. Xanax . 02/03/2021 6. Morbid obesity:Encouraged to continue with weight Loss and Healthy Living Regimen. 02/03/2021 7. Insomnia: No complaints today. Continue to monitor. 02/03/2021 8. Right Greater trochanter Bursitis: No complaints today. Continue Alternating Ice and Heat Therapy. Continue to monitor. 02/03/2021.. 9. Polyarthralgia:  Continue current medication regimen. Continue to monitor. 02/03/2021. 10. Constipation: RX: Senna- Ms. Ezzard Standing will F/U with her PCP. She will monitor her Dietary Habits. Continue to monitor.    F/U in 2 months      32.0

## 2021-04-12 ENCOUNTER — Encounter: Payer: Medicare Other | Admitting: Registered Nurse

## 2021-04-14 ENCOUNTER — Encounter: Payer: Self-pay | Admitting: Registered Nurse

## 2021-04-14 ENCOUNTER — Other Ambulatory Visit: Payer: Self-pay

## 2021-04-14 ENCOUNTER — Encounter: Payer: Medicare Other | Attending: Physical Medicine & Rehabilitation | Admitting: Registered Nurse

## 2021-04-14 VITALS — BP 112/76 | HR 64 | Ht 62.0 in | Wt 294.4 lb

## 2021-04-14 DIAGNOSIS — M5416 Radiculopathy, lumbar region: Secondary | ICD-10-CM | POA: Diagnosis present

## 2021-04-14 DIAGNOSIS — M7062 Trochanteric bursitis, left hip: Secondary | ICD-10-CM | POA: Insufficient documentation

## 2021-04-14 DIAGNOSIS — G35 Multiple sclerosis: Secondary | ICD-10-CM | POA: Insufficient documentation

## 2021-04-14 DIAGNOSIS — G8929 Other chronic pain: Secondary | ICD-10-CM | POA: Diagnosis present

## 2021-04-14 DIAGNOSIS — M2550 Pain in unspecified joint: Secondary | ICD-10-CM | POA: Diagnosis present

## 2021-04-14 DIAGNOSIS — Z79891 Long term (current) use of opiate analgesic: Secondary | ICD-10-CM | POA: Insufficient documentation

## 2021-04-14 DIAGNOSIS — M546 Pain in thoracic spine: Secondary | ICD-10-CM | POA: Diagnosis present

## 2021-04-14 DIAGNOSIS — Z5181 Encounter for therapeutic drug level monitoring: Secondary | ICD-10-CM | POA: Diagnosis present

## 2021-04-14 DIAGNOSIS — M4316 Spondylolisthesis, lumbar region: Secondary | ICD-10-CM | POA: Insufficient documentation

## 2021-04-14 DIAGNOSIS — G894 Chronic pain syndrome: Secondary | ICD-10-CM | POA: Insufficient documentation

## 2021-04-14 DIAGNOSIS — M7061 Trochanteric bursitis, right hip: Secondary | ICD-10-CM | POA: Diagnosis present

## 2021-04-14 MED ORDER — OXYCODONE-ACETAMINOPHEN 10-325 MG PO TABS: 1.0000 | ORAL_TABLET | Freq: Three times a day (TID) | ORAL | 0 refills | Status: DC | PRN

## 2021-04-14 NOTE — Progress Notes (Signed)
? ?Subjective:  ? ? Patient ID: Mariah Morris, female    DOB: 02/25/1970, 51 y.o.   MRN: [id number] ? ?HPI: Mariah Morris is a 51 y.o. female who returns for follow up appointment for chronic pain and medication refill. She states her pain is located in her mid- lower back radiating into her bilateral lower extremities. Also reports bilateral hip pain. She  rates her pain 6. Her current exercise regime is walking and performing stretching exercises. ? ?[name] Morphine equivalent is 44.44 MME. She is also prescribed Alprazolam  by [name] .We have discussed the black box warning of using opioids and benzodiazepines. I highlighted the dangers of using these drugs together and discussed the adverse events including respiratory suppression, overdose, cognitive impairment and importance of compliance with current regimen. We will continue to monitor and adjust as indicated.  ?she is being closely monitored and under the care of her psychiatrist.  ? ? UDS ordered today  ?  ? ?Pain Inventory ?Average Pain 6 ?Pain Right Now 6 ?My pain is constant, sharp, burning, tingling, and aching ? ?In the last 24 hours, has pain interfered with the following? ?General activity 5 ?Relation with others 5 ?Enjoyment of life 5 ?What TIME of day is your pain at its worst? evening and night ?Sleep (in general) Fair ? ?Pain is worse with: walking, bending, sitting, standing, and some activites ?Pain improves with: rest, heat/ice, therapy/exercise, and medication ?Relief from Meds: 8 ? ?Family History  ?Problem Relation Age of Onset  ? Hypertension Mother   ? Diabetes Father   ? Hypertension Father   ? ?Social History  ? ?Socioeconomic History  ? Marital status: Single  ?  Spouse name: Not on file  ? Number of children: Not on file  ? Years of education: Not on file  ? Highest education level: Not on file  ?Occupational History  ? Not on file  ?Tobacco Use  ? Smoking status: Never  ? Smokeless tobacco: Never  ?Vaping Use  ?  Vaping Use: Never used  ?Substance and Sexual Activity  ? Alcohol use: No  ? Drug use: Never  ? Sexual activity: Not on file  ?Other Topics Concern  ? Not on file  ?Social History Narrative  ? Not on file  ? ?Social Determinants of Health  ? ?Financial Resource Strain: Not on file  ?Food Insecurity: Not on file  ?Transportation Needs: Not on file  ?Physical Activity: Not on file  ?Stress: Not on file  ?Social Connections: Not on file  ? ?Past Surgical History:  ?Procedure Laterality Date  ? CESAREAN SECTION    ? x2  ? OVARIAN CYST REMOVAL    ? ?Past Surgical History:  ?Procedure Laterality Date  ? CESAREAN SECTION    ? x2  ? OVARIAN CYST REMOVAL    ? ?Past Medical History:  ?Diagnosis Date  ? Chronic back pain   ? GAD (generalized anxiety disorder)   ? GERD (gastroesophageal reflux disease)   ? Gestational diabetes   ? Hypertension   ? Neuromuscular disorder (HCC) 2011  ? MS  ? Presence of permanent cardiac pacemaker   ? Seizures (HCC)   ? ?BP 112/76   Pulse 64   Ht 5\' 2"  (1.575 m)   Wt 294 lb 6.4 oz (133.5 kg)   SpO2 98%   BMI 53.85 kg/m?  ? ?Opioid Risk Score:   ?Fall Risk Score:  `1 ? ?Depression screen PHQ 2/9 ? ?Depression screen [facility] 2/9 04/14/2021  12/09/2020 10/28/2020 06/29/2020 05/21/2020 04/07/2020 12/22/2019  ?Decreased Interest 0 0 1 1 0 3 0  ?Down, Depressed, Hopeless 0 0 1 1 0 3 0  ?PHQ - 2 Score 0 0 2 2 0 6 0  ?Altered sleeping - - - - 0 - -  ?Tired, decreased energy - - - - - - -  ?Change in appetite - - - - - - -  ?Feeling bad or failure about yourself  - - - - - - -  ?Trouble concentrating - - - - - - -  ?Moving slowly or fidgety/restless - - - - - - -  ?Suicidal thoughts - - - - - - -  ?PHQ-9 Score - - - - 0 - -  ? ? ? ?Review of Systems  ?Constitutional: Negative.   ?HENT: Negative.    ?Eyes: Negative.   ?Respiratory: Negative.    ?Cardiovascular: Negative.   ?Gastrointestinal: Negative.   ?Endocrine: Negative.   ?Genitourinary: Negative.   ?Musculoskeletal:  Positive for back pain.  ?Skin:  Negative.   ?Allergic/Immunologic: Negative.   ?Neurological: Negative.   ?Hematological: Negative.   ?Psychiatric/Behavioral: Negative.    ? ?   ?Objective:  ? Physical Exam ?Vitals and nursing note reviewed.  ?Constitutional:   ?   Appearance: Normal appearance.  ?Cardiovascular:  ?   Rate and Rhythm: Normal rate and regular rhythm.  ?   Pulses: Normal pulses.  ?   Heart sounds: Normal heart sounds.  ?Pulmonary:  ?   Effort: Pulmonary effort is normal.  ?   Breath sounds: Normal breath sounds.  ?Musculoskeletal:  ?   Cervical back: Normal range of motion and neck supple.  ?   Comments: Normal Muscle Bulk and Muscle Testing Reveals:  ?Upper Extremities: Full ROM and Muscle Strength 5/5 ?Thoracic Paraspinal Tenderness: T-7-T-9 ?Lumbar Paraspinal Tenderness: L-3-L-5 ?Bilateral Greater Trochanter Tenderness ?Lower Extremities: Full ROM and Muscle Strength 5/5 ?Arises from Chair slowly ?Antalgic  Gait  ?   ?Skin: ?   General: Skin is warm and dry.  ?Neurological:  ?   Mental Status: She is alert and oriented to person, place, and time.  ?Psychiatric:     ?   Mood and Affect: Mood normal.     ?   Behavior: Behavior normal.  ? ? ? ? ?   ?Assessment & Plan:  ?1.Multiple Sclerosis: Neurology Following: Dr. Sherryll Burger. 04/14/2021 ?2. Lumbar Spondylolisthesis/Low back pain/ Lumbar Radiculitis/ Neuropathic Pain: Continue Current exercise Regime. Continue using heat therapy. Continue current medication regimen with Gabapentin. 04/14/2021  ?3.  Chronic Pain: Continue with the slow weaning. Tolerating slow weaning. Refilled:  OxyCODONE 10/325 mg one tablet 3 times daily as need for pain #80 We will continue to monitor. Second script sent for the following month. 04/14/2021.  ?We will continue the opioid monitoring program, this consists of regular clinic visits, examinations, urine drug screen, pill counts as well as use of [place] Controlled Substance Reporting system. A 12 month History has been reviewed on the [place]  Controlled Substance Reporting System on 04/14/2021 ?4. Muscle Spasms: Continue current medication regimen with Tizanidine. 04/14/2021  ?5. Depression/anxiety:Psychiatry Following Dr. Percell Belt  Continue Monthly Counseling. Continue Current Medication Regime. Xanax . 04/14/2021 ?6. Morbid obesity:Encouraged to continue with weight Loss and Healthy Living Regimen. 04/14/2021 ?7. Insomnia: No complaints today. Continue to monitor. 04/14/2021 ?8. Right Greater trochanter Bursitis: No complaints today. Continue Alternating Ice and Heat Therapy. Continue to monitor. 04/14/2021.Marland Kitchen ?9. Polyarthralgia:  Continue current medication regimen. Continue  to monitor. 04/14/2021. ?10. Constipation: No complaints today. Continue Senna- Ms. Ezzard Standing will F/U with her PCP. She will monitor her Dietary Habits. Continue to monitor. 04/14/2021 ?  ?  ?F/U in 2 months   ? ?

## 2021-04-21 ENCOUNTER — Other Ambulatory Visit: Payer: Self-pay | Admitting: Registered Nurse

## 2021-04-21 DIAGNOSIS — M4316 Spondylolisthesis, lumbar region: Secondary | ICD-10-CM

## 2021-04-21 LAB — TOXASSURE SELECT,+ANTIDEPR,UR

## 2021-04-22 ENCOUNTER — Other Ambulatory Visit: Payer: Self-pay | Admitting: Registered Nurse

## 2021-04-22 DIAGNOSIS — M4316 Spondylolisthesis, lumbar region: Secondary | ICD-10-CM

## 2021-04-27 ENCOUNTER — Telehealth: Payer: Self-pay | Admitting: *Deleted

## 2021-04-27 NOTE — Telephone Encounter (Signed)
Urine drug screen for this encounter is consistent for prescribed medication 

## 2021-06-16 ENCOUNTER — Encounter: Payer: Medicare Other | Attending: Physical Medicine & Rehabilitation | Admitting: Registered Nurse

## 2021-06-16 ENCOUNTER — Encounter: Payer: Self-pay | Admitting: Registered Nurse

## 2021-06-16 VITALS — BP 115/84 | HR 66 | Ht 62.0 in | Wt 290.6 lb

## 2021-06-16 DIAGNOSIS — Z5181 Encounter for therapeutic drug level monitoring: Secondary | ICD-10-CM | POA: Diagnosis present

## 2021-06-16 DIAGNOSIS — M5416 Radiculopathy, lumbar region: Secondary | ICD-10-CM | POA: Diagnosis present

## 2021-06-16 DIAGNOSIS — G35 Multiple sclerosis: Secondary | ICD-10-CM

## 2021-06-16 DIAGNOSIS — M4316 Spondylolisthesis, lumbar region: Secondary | ICD-10-CM | POA: Diagnosis present

## 2021-06-16 DIAGNOSIS — G894 Chronic pain syndrome: Secondary | ICD-10-CM

## 2021-06-16 DIAGNOSIS — M542 Cervicalgia: Secondary | ICD-10-CM | POA: Diagnosis present

## 2021-06-16 DIAGNOSIS — M2550 Pain in unspecified joint: Secondary | ICD-10-CM

## 2021-06-16 MED ORDER — OXYCODONE-ACETAMINOPHEN 10-325 MG PO TABS: 1.0000 | ORAL_TABLET | Freq: Three times a day (TID) | ORAL | 0 refills | Status: DC | PRN

## 2021-06-16 NOTE — Progress Notes (Signed)
Subjective:    Patient ID: Mariah Morris, female    DOB: 1970/06/15, 51 y.o.   MRN: [id number]  HPI: Mariah Morris is a 51 y.o. female who returns for follow up appointment for chronic pain and medication refill. She states her pain is located in her neck, lower back pain and reports generalized joint pain. She rates her pain 7. Her current exercise regime is walking and performing stretching exercises.  Mariah Morris Morphine equivalent is 41.54 MME. She is also prescribed Alprazolam  by [name] .We have discussed the black box warning of using opioids and benzodiazepines. I highlighted the dangers of using these drugs together and discussed the adverse events including respiratory suppression, overdose, cognitive impairment and importance of compliance with current regimen. We will continue to monitor and adjust as indicated.  she is being closely monitored and under the care of her psychiatrist.   Last UDS was Performed on 04/14/2021, it was consistent.     Pain Inventory Average Pain 7 Pain Right Now 7 My pain is constant, stabbing, tingling, and aching  In the last 24 hours, has pain interfered with the following? General activity 5 Relation with others 5 Enjoyment of life 5 What TIME of day is your pain at its worst? evening and night Sleep (in general) Good  Pain is worse with: walking and some activites Pain improves with: rest, heat/ice, therapy/exercise, and medication Relief from Meds: 8  Family History  Problem Relation Age of Onset   Hypertension Mother    Diabetes Father    Hypertension Father    Social History   Socioeconomic History   Marital status: Single    Spouse name: Not on file   Number of children: Not on file   Years of education: Not on file   Highest education level: Not on file  Occupational History   Not on file  Tobacco Use   Smoking status: Never   Smokeless tobacco: Never  Vaping Use   Vaping Use: Never used  Substance  and Sexual Activity   Alcohol use: No   Drug use: Never   Sexual activity: Not on file  Other Topics Concern   Not on file  Social History Narrative   Not on file   Social Determinants of Health   Financial Resource Strain: Not on file  Food Insecurity: Not on file  Transportation Needs: Not on file  Physical Activity: Not on file  Stress: Not on file  Social Connections: Not on file   Past Surgical History:  Procedure Laterality Date   CESAREAN SECTION     x2   OVARIAN CYST REMOVAL     Past Surgical History:  Procedure Laterality Date   CESAREAN SECTION     x2   OVARIAN CYST REMOVAL     Past Medical History:  Diagnosis Date   Chronic back pain    GAD (generalized anxiety disorder)    GERD (gastroesophageal reflux disease)    Gestational diabetes    Hypertension    Neuromuscular disorder (HCC) 2011   MS   Presence of permanent cardiac pacemaker    Seizures (HCC)    BP 115/84   Pulse 66   Ht 5\' 2"  (1.575 m)   Wt 290 lb 9.6 oz (131.8 kg)   SpO2 98%   BMI 53.15 kg/m   Opioid Risk Score:   Fall Risk Score:  `1  Depression screen [facility] 2/9     06/16/2021   10:26 AM 04/14/2021  9:52 AM 12/09/2020    9:16 AM 10/28/2020    8:49 AM 06/29/2020   11:16 AM 05/21/2020   11:16 AM 04/07/2020    9:28 AM  Depression screen PHQ 2/9  Decreased Interest 1 0 0 1 1 0 3  Down, Depressed, Hopeless 3 0 0 1 1 0 3  PHQ - 2 Score 4 0 0 2 2 0 6  Altered sleeping      0   PHQ-9 Score      0      Review of Systems  Constitutional: Negative.   HENT: Negative.    Eyes: Negative.   Respiratory: Negative.    Cardiovascular: Negative.   Gastrointestinal: Negative.   Endocrine: Negative.   Genitourinary: Negative.   Musculoskeletal:  Positive for back pain and neck pain.  Skin: Negative.   Allergic/Immunologic: Negative.   Neurological: Negative.   Hematological: Negative.   Psychiatric/Behavioral:  Positive for dysphoric mood.       Objective:   Physical Exam Vitals  and nursing note reviewed.  Constitutional:      Appearance: Normal appearance.  Cardiovascular:     Rate and Rhythm: Normal rate and regular rhythm.     Pulses: Normal pulses.     Heart sounds: Normal heart sounds.  Pulmonary:     Effort: Pulmonary effort is normal.     Breath sounds: Normal breath sounds.  Musculoskeletal:     Cervical back: Normal range of motion and neck supple.     Comments: Normal Muscle Bulk and Muscle Testing Reveals:  Upper Extremities: Full ROM and Muscle Strength 5/5 Bilateral AC Joint Tenderness Lumbar Paraspinal Tenderness: L-3-L-5 Lower Extremities: Full ROM and Muscle Strength 5/5 Bilateral Lower Extremities Flexion Produces Pain into her Bilateral Lower Extremities and Bilateral Patellas Arises from Chair slowly Antalgic  Gait     Skin:    General: Skin is warm and dry.  Neurological:     Mental Status: She is alert and oriented to person, place, and time.  Psychiatric:        Mood and Affect: Mood normal.        Behavior: Behavior normal.         Assessment & Plan:  1.Multiple Sclerosis: Neurology Following: Dr. Sherryll Burger. 06/16/2021 2. Lumbar Spondylolisthesis/Low back pain/ Lumbar Radiculitis/ Neuropathic Pain: Continue Current exercise Regime. Continue using heat therapy. Continue current medication regimen with Gabapentin. 06/16/2021  3.  Chronic Pain: Continue with the slow weaning. Tolerating slow weaning. Refilled:  OxyCODONE 10/325 mg one tablet 3 times daily as need for pain #80 We will continue to monitor. Second script sent for the following month. 06/16/2021.  We will continue the opioid monitoring program, this consists of regular clinic visits, examinations, urine drug screen, pill counts as well as use of [place] Controlled Substance Reporting system. A 12 month History has been reviewed on the [place] Controlled Substance Reporting System on 06/16/2021 4. Muscle Spasms: Continue current medication regimen with  Tizanidine. 06/16/2021  5. Depression/anxiety:Psychiatry Following Dr. Percell Belt  Continue Monthly Counseling. Continue Current Medication Regime. Xanax . 06/16/2021 6. Morbid obesity:Encouraged to continue with weight Loss and Healthy Living Regimen. 06/16/2021 7. Insomnia: No complaints today. Continue to monitor. 06/16/2021 8. Right Greater trochanter Bursitis: No complaints today. Continue Alternating Ice and Heat Therapy. Continue to monitor. 06/16/2021.. 9. Polyarthralgia:  Continue current medication regimen. Continue to monitor. 06/16/2021. 10. Constipation: No complaints today. Continue Senna- Ms. Ezzard Standing will F/U with her PCP. She will monitor her Dietary Habits. Continue  to monitor. 06/16/2021     F/U in 2 months

## 2021-08-16 ENCOUNTER — Encounter: Payer: Medicare Other | Attending: Physical Medicine & Rehabilitation | Admitting: Registered Nurse

## 2021-08-16 ENCOUNTER — Encounter: Payer: Self-pay | Admitting: Registered Nurse

## 2021-08-16 VITALS — BP 126/85 | HR 77 | Ht 62.0 in | Wt 293.2 lb

## 2021-08-16 DIAGNOSIS — M4316 Spondylolisthesis, lumbar region: Secondary | ICD-10-CM | POA: Insufficient documentation

## 2021-08-16 DIAGNOSIS — Z79891 Long term (current) use of opiate analgesic: Secondary | ICD-10-CM | POA: Insufficient documentation

## 2021-08-16 DIAGNOSIS — Z5181 Encounter for therapeutic drug level monitoring: Secondary | ICD-10-CM | POA: Insufficient documentation

## 2021-08-16 DIAGNOSIS — G894 Chronic pain syndrome: Secondary | ICD-10-CM | POA: Insufficient documentation

## 2021-08-16 MED ORDER — OXYCODONE-ACETAMINOPHEN 10-325 MG PO TABS: 1.0000 | ORAL_TABLET | Freq: Three times a day (TID) | ORAL | 0 refills | Status: DC | PRN

## 2021-08-16 NOTE — Progress Notes (Signed)
Subjective:    Patient ID: Mariah Morris, female    DOB: [birth year], 51 y.o.   MRN: [id number]  HPI: Mariah Morris is a 51 y.o. female who returns for follow up appointment for chronic pain and medication refill. She states her pain is located in her bilateral shoulders and lower back pain radiating into her bilateral lower extremities. She rates her pain 7. Her current exercise regime is walking and performing stretching exercises.  [name] Morphine equivalent is 44.44 MME.She  is also prescribed Alprazolam  by  [name]  .We have discussed the black box warning of using opioids and benzodiazepines. I highlighted the dangers of using these drugs together and discussed the adverse events including respiratory suppression, overdose, cognitive impairment and importance of compliance with current regimen. We will continue to monitor and adjust as indicated.  she is being closely monitored and under the care of her psychiatrist.   UDS ordered today.     Pain Inventory Average Pain 7 Pain Right Now 7 My pain is constant, sharp, burning, tingling, and aching  In the last 24 hours, has pain interfered with the following? General activity 5 Relation with others 5 Enjoyment of life 5 What TIME of day is your pain at its worst? morning , daytime, evening, and night Sleep (in general) Fair  Pain is worse with: walking, bending, sitting, and standing Pain improves with: rest, heat/ice, therapy/exercise, and medication Relief from Meds: 7  Family History  Problem Relation Age of Onset   Hypertension Mother    Diabetes Father    Hypertension Father    Social History   Socioeconomic History   Marital status: Single    Spouse name: Not on file   Number of children: Not on file   Years of education: Not on file   Highest education level: Not on file  Occupational History   Not on file  Tobacco Use   Smoking status: Never   Smokeless tobacco: Never  Vaping Use    Vaping Use: Never used  Substance and Sexual Activity   Alcohol use: No   Drug use: Never   Sexual activity: Not on file  Other Topics Concern   Not on file  Social History Narrative   Not on file   Social Determinants of Health   Financial Resource Strain: Not on file  Food Insecurity: Not on file  Transportation Needs: Not on file  Physical Activity: Not on file  Stress: Not on file  Social Connections: Not on file   Past Surgical History:  Procedure Laterality Date   CESAREAN SECTION     x2   OVARIAN CYST REMOVAL     Past Surgical History:  Procedure Laterality Date   CESAREAN SECTION     x2   OVARIAN CYST REMOVAL     Past Medical History:  Diagnosis Date   Chronic back pain    GAD (generalized anxiety disorder)    GERD (gastroesophageal reflux disease)    Gestational diabetes    Hypertension    Neuromuscular disorder (HCC) 2011   MS   Presence of permanent cardiac pacemaker    Seizures (HCC)    BP 126/85   Pulse 77   Ht 5\' 2"  (1.575 m)   Wt 293 lb 3.2 oz (133 kg)   SpO2 96%   BMI 53.63 kg/m   Opioid Risk Score:   Fall Risk Score:  `1  Depression screen [facility] 2/9     06/16/2021  10:26 AM 04/14/2021    9:52 AM 12/09/2020    9:16 AM 10/28/2020    8:49 AM 06/29/2020   11:16 AM 05/21/2020   11:16 AM 04/07/2020    9:28 AM  Depression screen PHQ 2/9  Decreased Interest 1 0 0 1 1 0 3  Down, Depressed, Hopeless 3 0 0 1 1 0 3  PHQ - 2 Score 4 0 0 2 2 0 6  Altered sleeping      0   PHQ-9 Score      0      Review of Systems  Musculoskeletal:  Positive for back pain.       Bilateral lower leg pain Bilateral lower arm pain  Neurological:  Positive for weakness.  All other systems reviewed and are negative.     Objective:   Physical Exam Vitals and nursing note reviewed.  Constitutional:      Appearance: Normal appearance.  Cardiovascular:     Rate and Rhythm: Normal rate and regular rhythm.     Pulses: Normal pulses.     Heart sounds: Normal  heart sounds.  Pulmonary:     Effort: Pulmonary effort is normal.     Breath sounds: Normal breath sounds.  Musculoskeletal:     Cervical back: Normal range of motion and neck supple.     Comments: Normal Muscle Bulk and Muscle Testing Reveals:  Upper Extremities: Full ROM and Muscle Strength 5/5 Thoracic Paraspinal Tenderness: T-1-T-7 Lumbar Paraspinal Tenderness: L-3-L-5 Lower Extremities: Full ROM and Muscle Strength 5/5 Arises from chair slowly Antalgic  Gait     Skin:    General: Skin is warm and dry.  Neurological:     Mental Status: She is alert and oriented to person, place, and time.  Psychiatric:        Mood and Affect: Mood normal.        Behavior: Behavior normal.         Assessment & Plan:  1.Multiple Sclerosis: Neurology Following: Dr. Sherryll Burger. 08/16/2021 2. Lumbar Spondylolisthesis/Low back pain/ Lumbar Radiculitis/ Neuropathic Pain: Continue Current exercise Regime. Continue using heat therapy. Continue current medication regimen with Gabapentin. 08/16/2021  3.  Chronic Pain: Continue with the slow weaning. Tolerating slow weaning. Refilled:  OxyCODONE 10/325 mg one tablet 3 times daily as need for pain #80 We will continue to monitor. Second script sent for the following month. 08/16/2021.  We will continue the opioid monitoring program, this consists of regular clinic visits, examinations, urine drug screen, pill counts as well as use of [place] Controlled Substance Reporting system. A 12 month History has been reviewed on the [place] Controlled Substance Reporting System on 08/16/2021 4. Muscle Spasms: Continue current medication regimen with Tizanidine. 08/16/2021  5. Depression/anxiety:Psychiatry Following Dr. Percell Belt  Continue Monthly Counseling. Continue Current Medication Regime. Xanax . 08/16/2021 6. Morbid obesity:Encouraged to continue with weight Loss and Healthy Living Regimen. 08/16/2021 7. Insomnia: No complaints today. Continue to monitor.  08/16/2021 8. Right Greater trochanter Bursitis: No complaints today. Continue Alternating Ice and Heat Therapy. Continue to monitor. 08/16/2021.. 9. Polyarthralgia:  Continue current medication regimen. Continue to monitor. 08/16/2021. 10. Constipation: No complaints today. Continue Senna- Ms. Ezzard Standing will F/U with her PCP. She will monitor her Dietary Habits. Continue to monitor. 08/16/2021     F/U in 2 months

## 2021-08-20 LAB — TOXASSURE SELECT,+ANTIDEPR,UR

## 2021-08-24 ENCOUNTER — Telehealth: Payer: Self-pay | Admitting: *Deleted

## 2021-08-24 NOTE — Telephone Encounter (Signed)
Urine drug screen for this encounter is consistent for prescribed medication 

## 2021-10-18 ENCOUNTER — Encounter: Payer: Self-pay | Admitting: Registered Nurse

## 2021-10-18 ENCOUNTER — Encounter: Payer: Medicare Other | Attending: Physical Medicine & Rehabilitation | Admitting: Registered Nurse

## 2021-10-18 VITALS — BP 126/83 | HR 71 | Ht 62.0 in | Wt 291.4 lb

## 2021-10-18 DIAGNOSIS — Z79891 Long term (current) use of opiate analgesic: Secondary | ICD-10-CM

## 2021-10-18 DIAGNOSIS — Z5181 Encounter for therapeutic drug level monitoring: Secondary | ICD-10-CM | POA: Diagnosis present

## 2021-10-18 DIAGNOSIS — M4316 Spondylolisthesis, lumbar region: Secondary | ICD-10-CM | POA: Diagnosis present

## 2021-10-18 DIAGNOSIS — G35 Multiple sclerosis: Secondary | ICD-10-CM

## 2021-10-18 DIAGNOSIS — M5416 Radiculopathy, lumbar region: Secondary | ICD-10-CM | POA: Diagnosis not present

## 2021-10-18 DIAGNOSIS — G894 Chronic pain syndrome: Secondary | ICD-10-CM | POA: Diagnosis not present

## 2021-10-18 DIAGNOSIS — M2550 Pain in unspecified joint: Secondary | ICD-10-CM | POA: Diagnosis not present

## 2021-10-18 MED ORDER — OXYCODONE-ACETAMINOPHEN 10-325 MG PO TABS: 1.0000 | ORAL_TABLET | Freq: Three times a day (TID) | ORAL | 0 refills | Status: DC | PRN

## 2021-10-18 NOTE — Progress Notes (Signed)
Subjective:    Patient ID: [name], female    DOB: Mar 06, 1970, 51 y.o.   MRN: [id number]  HPI: KAYLEI FRINK is a 51 y.o. female who returns for follow up appointment for chronic pain and medication refill. She states her pain is located in her lower back radiating into her bilateral lower extremities and generalize joint pain. She rates her pain 6. Her current exercise regime is walking and going to the [employer] three - four days a week.   Ms. Cotrell Morphine equivalent is 40.00 MME.   Last UDS was Performed on 08/16/2021, it was consistent.     Pain Inventory Average Pain 8 Pain Right Now 6 My pain is constant, sharp, burning, stabbing, tingling, and aching  In the last 24 hours, has pain interfered with the following? General activity 6 Relation with others 5 Enjoyment of life 5 What TIME of day is your pain at its worst? evening and night Sleep (in general) Fair  Pain is worse with: walking, bending, sitting, and standing Pain improves with: rest, heat/ice, therapy/exercise, and medication Relief from Meds: 8  Family History  Problem Relation Age of Onset   Hypertension Mother    Diabetes Father    Hypertension Father    Social History   Socioeconomic History   Marital status: Single    Spouse name: Not on file   Number of children: Not on file   Years of education: Not on file   Highest education level: Not on file  Occupational History   Not on file  Tobacco Use   Smoking status: Never   Smokeless tobacco: Never  Vaping Use   Vaping Use: Never used  Substance and Sexual Activity   Alcohol use: No   Drug use: Never   Sexual activity: Not on file  Other Topics Concern   Not on file  Social History Narrative   Not on file   Social Determinants of Health   Financial Resource Strain: Not on file  Food Insecurity: Not on file  Transportation Needs: Not on file  Physical Activity: Not on file  Stress: Not on file  Social Connections: Not on  file   Past Surgical History:  Procedure Laterality Date   CESAREAN SECTION     x2   OVARIAN CYST REMOVAL     Past Surgical History:  Procedure Laterality Date   CESAREAN SECTION     x2   OVARIAN CYST REMOVAL     Past Medical History:  Diagnosis Date   Chronic back pain    GAD (generalized anxiety disorder)    GERD (gastroesophageal reflux disease)    Gestational diabetes    Hypertension    Neuromuscular disorder (HCC) 2011   MS   Presence of permanent cardiac pacemaker    Seizures (HCC)    BP 126/83   Pulse 71   Ht 5\' 2"  (1.575 m)   Wt 291 lb 6.4 oz (132.2 kg)   SpO2 98%   BMI 53.30 kg/m   Opioid Risk Score:   Fall Risk Score:  `1  Depression screen [facility] 2/9     10/18/2021   10:02 AM 06/16/2021   10:26 AM 04/14/2021    9:52 AM 12/09/2020    9:16 AM 10/28/2020    8:49 AM 06/29/2020   11:16 AM 05/21/2020   11:16 AM  Depression screen PHQ 2/9  Decreased Interest 0 1 0 0 1 1 0  Down, Depressed, Hopeless 1 3 0 0 1 1  0  PHQ - 2 Score 1 4 0 0 2 2 0  Altered sleeping       0  PHQ-9 Score       0     Review of Systems  Constitutional: Negative.   HENT: Negative.    Eyes: Negative.   Respiratory: Negative.    Cardiovascular: Negative.   Gastrointestinal: Negative.   Endocrine: Negative.   Genitourinary: Negative.   Musculoskeletal:  Positive for back pain.  Skin: Negative.   Allergic/Immunologic: Negative.   Neurological: Negative.   Hematological: Negative.   Psychiatric/Behavioral: Negative.        Objective:   Physical Exam Vitals and nursing note reviewed.  Constitutional:      Appearance: Normal appearance. She is obese.  Cardiovascular:     Rate and Rhythm: Normal rate and regular rhythm.     Pulses: Normal pulses.     Heart sounds: Normal heart sounds.  Pulmonary:     Effort: Pulmonary effort is normal.     Breath sounds: Normal breath sounds.  Musculoskeletal:     Cervical back: Normal range of motion and neck supple.     Comments: Normal  Muscle Bulk and Muscle Testing Reveals:  Upper Extremities: Full ROM and Muscle Strength 5/5  Lumbar Paraspinal Tenderness: L-4-L-5 Lower Extremities: Decreased ROM and Muscle Strength 5/5 Left Lower Extremity Flexion Produces Pain into her Left Patella Arises from Table Slowly Narrow Based  Gait      Skin:    General: Skin is warm and dry.  Neurological:     Mental Status: She is alert and oriented to person, place, and time.  Psychiatric:        Mood and Affect: Mood normal.        Behavior: Behavior normal.         Assessment & Plan:  1.Multiple Sclerosis: Neurology Following: Dr. Manuella Ghazi. 10/18/2021 2. Lumbar Spondylolisthesis/Low back pain/ Lumbar Radiculitis/ Neuropathic Pain: Continue Current exercise Regime. Continue using heat therapy. Continue current medication regimen with Gabapentin. 10/18/2021  3.  Chronic Pain: Continue with the slow weaning. Tolerating slow weaning. Refilled:  OxyCODONE 10/325 mg one tablet 3 times daily as need for pain #80 We will continue to monitor. Second script sent for the following month. 10/18/2021.  We will continue the opioid monitoring program, this consists of regular clinic visits, examinations, urine drug screen, pill counts as well as use of [place] Controlled Substance Reporting system. A 12 month History has been reviewed on the [place] Controlled Substance Reporting System on 10/18/2021 4. Muscle Spasms: Continue current medication regimen with Tizanidine. 10/18/2021  5. Depression/anxiety:Psychiatry Following Dr. Vena Rua  Continue Monthly Counseling. Continue Current Medication Regime. Xanax . 10/18/2021 6. Morbid obesity:Encouraged to continue with weight Loss and Healthy Living Regimen. 10/18/2021 7. Insomnia: No complaints today. Continue to monitor. 10/18/2021 8. Right Greater trochanter Bursitis: No complaints today. Continue Alternating Ice and Heat Therapy. Continue to monitor. 10/18/2021.. 9. Polyarthralgia:   Continue current medication regimen. Continue to monitor. 10/18/2021. 10. Constipation: No complaints today. Continue Senna- Ms. Lucia Gaskins will F/U with her PCP. She will monitor her Dietary Habits. Continue to monitor. 10/18/2021     F/U in 2 months

## 2021-12-01 ENCOUNTER — Other Ambulatory Visit: Payer: Self-pay | Admitting: Physician Assistant

## 2021-12-01 DIAGNOSIS — Z1231 Encounter for screening mammogram for malignant neoplasm of breast: Secondary | ICD-10-CM

## 2021-12-19 ENCOUNTER — Encounter: Payer: Self-pay | Admitting: Registered Nurse

## 2021-12-19 ENCOUNTER — Encounter: Payer: Medicare Other | Attending: Physical Medicine & Rehabilitation | Admitting: Registered Nurse

## 2021-12-19 ENCOUNTER — Other Ambulatory Visit: Payer: Self-pay | Admitting: Registered Nurse

## 2021-12-19 VITALS — BP 113/79 | HR 71 | Ht 62.0 in | Wt 292.6 lb

## 2021-12-19 DIAGNOSIS — M2550 Pain in unspecified joint: Secondary | ICD-10-CM

## 2021-12-19 DIAGNOSIS — G35 Multiple sclerosis: Secondary | ICD-10-CM | POA: Diagnosis not present

## 2021-12-19 DIAGNOSIS — G894 Chronic pain syndrome: Secondary | ICD-10-CM

## 2021-12-19 DIAGNOSIS — Z5181 Encounter for therapeutic drug level monitoring: Secondary | ICD-10-CM

## 2021-12-19 DIAGNOSIS — Z79891 Long term (current) use of opiate analgesic: Secondary | ICD-10-CM | POA: Diagnosis present

## 2021-12-19 DIAGNOSIS — M4316 Spondylolisthesis, lumbar region: Secondary | ICD-10-CM | POA: Diagnosis present

## 2021-12-19 DIAGNOSIS — M5416 Radiculopathy, lumbar region: Secondary | ICD-10-CM

## 2021-12-19 MED ORDER — OXYCODONE-ACETAMINOPHEN 10-325 MG PO TABS: 1.0000 | ORAL_TABLET | Freq: Three times a day (TID) | ORAL | 0 refills | Status: DC | PRN

## 2021-12-19 NOTE — Progress Notes (Signed)
Subjective:    Patient ID: Mariah Morris, female    DOB: [birth year], 51 y.o.   MRN: [id number]  HPI: Mariah Morris is a 51 y.o. female who returns for follow up appointment for chronic pain and medication refill. She states her pain is located in her lower back radiating into her bilateral lower extremities. She rates her pain 6. Her current exercise regime is walking and performing stretching exercises.  [name] Morphine equivalent is 36.80 MME. She is also prescribed Alprazolam  by [name] .We have discussed the black box warning of using opioids and benzodiazepines. I highlighted the dangers of using these drugs together and discussed the adverse events including respiratory suppression, overdose, cognitive impairment and importance of compliance with current regimen. We will continue to monitor and adjust as indicated.  she is being closely monitored and under the care of her psychiatrist.  Last UDS was Performed on 08/16/2021, it was consistent.    Pain Inventory Average Pain 6 Pain Right Now 6 My pain is constant and aching  In the last 24 hours, has pain interfered with the following? General activity 5 Relation with others 5 Enjoyment of life 5 What TIME of day is your pain at its worst? evening and night Sleep (in general) Good  Pain is worse with: walking, bending, sitting, and standing Pain improves with: rest, heat/ice, therapy/exercise, and medication Relief from Meds: 7  Family History  Problem Relation Age of Onset   Hypertension Mother    Diabetes Father    Hypertension Father    Social History   Socioeconomic History   Marital status: Single    Spouse name: Not on file   Number of children: Not on file   Years of education: Not on file   Highest education level: Not on file  Occupational History   Not on file  Tobacco Use   Smoking status: Never   Smokeless tobacco: Never  Vaping Use   Vaping Use: Never used  Substance and Sexual  Activity   Alcohol use: No   Drug use: Never   Sexual activity: Not on file  Other Topics Concern   Not on file  Social History Narrative   Not on file   Social Determinants of Health   Financial Resource Strain: Not on file  Food Insecurity: Not on file  Transportation Needs: Not on file  Physical Activity: Not on file  Stress: Not on file  Social Connections: Not on file   Past Surgical History:  Procedure Laterality Date   CESAREAN SECTION     x2   OVARIAN CYST REMOVAL     Past Surgical History:  Procedure Laterality Date   CESAREAN SECTION     x2   OVARIAN CYST REMOVAL     Past Medical History:  Diagnosis Date   Chronic back pain    GAD (generalized anxiety disorder)    GERD (gastroesophageal reflux disease)    Gestational diabetes    Hypertension    Neuromuscular disorder (HCC) 2011   MS   Presence of permanent cardiac pacemaker    Seizures (HCC)    There were no vitals taken for this visit.  Opioid Risk Score:   Fall Risk Score:  `1  Depression screen [facility] 2/9     10/18/2021   10:02 AM 06/16/2021   10:26 AM 04/14/2021    9:52 AM 12/09/2020    9:16 AM 10/28/2020    8:49 AM 06/29/2020   11:16 AM 05/21/2020  11:16 AM  Depression screen PHQ 2/9  Decreased Interest 0 1 0 0 1 1 0  Down, Depressed, Hopeless 1 3 0 0 1 1 0  PHQ - 2 Score 1 4 0 0 2 2 0  Altered sleeping       0  PHQ-9 Score       0      Review of Systems  Constitutional: Negative.   HENT: Negative.    Eyes: Negative.   Respiratory: Negative.    Endocrine: Negative.   Genitourinary: Negative.   Musculoskeletal:  Positive for arthralgias, back pain and myalgias.  Skin: Negative.   Allergic/Immunologic: Negative.   Neurological: Negative.   Hematological: Negative.   Psychiatric/Behavioral: Negative.         Objective:   Physical Exam Vitals and nursing note reviewed.  Constitutional:      Appearance: Normal appearance. She is obese.  Cardiovascular:     Rate and Rhythm:  Normal rate and regular rhythm.     Pulses: Normal pulses.     Heart sounds: Normal heart sounds.  Pulmonary:     Effort: Pulmonary effort is normal.     Breath sounds: Normal breath sounds.  Musculoskeletal:     Cervical back: Normal range of motion and neck supple.     Comments: Normal Muscle Bulk and Muscle Testing Reveals:  Upper Extremities: Full ROM and Muscle Strength 5/5 Lumbar Paraspinal Tenderness: L-4-L-5 Lower Extremities: Full ROM and Muscle Strength 5/5 Arises from Chair slowly Antalgic  Gait     Skin:    General: Skin is warm and dry.  Neurological:     Mental Status: She is alert and oriented to person, place, and time.  Psychiatric:        Mood and Affect: Mood normal.        Behavior: Behavior normal.         Assessment & Plan:  1.Multiple Sclerosis: Neurology Following: Dr. Sherryll Burger. 12/19/2021 2. Lumbar Spondylolisthesis/Low back pain/ Lumbar Radiculitis/ Neuropathic Pain: Continue Current exercise Regime. Continue using heat therapy. Continue current medication regimen with Gabapentin. 12/19/2021  3.  Chronic Pain: Continue with the slow weaning. Tolerating slow weaning. Refilled:  OxyCODONE 10/325 mg one tablet 3 times daily as need for pain #75 We will continue to monitor. Second script sent for the following month. 12/19/2021.  We will continue the opioid monitoring program, this consists of regular clinic visits, examinations, urine drug screen, pill counts as well as use of [place] Controlled Substance Reporting system. A 12 month History has been reviewed on the [place] Controlled Substance Reporting System on 12/19/2021 4. Muscle Spasms: Continue current medication regimen with Tizanidine. 12/19/2021  5. Depression/anxiety:Psychiatry Following Dr. Percell Belt  Continue Monthly Counseling. Continue Current Medication Regime. Xanax . 12/19/2021 6. Morbid obesity:Encouraged to continue with weight Loss and Healthy Living Regimen. 12/19/2021 7.  Insomnia: No complaints today. Continue to monitor. 12/19/2021 8. Right Greater trochanter Bursitis: No complaints today. Continue Alternating Ice and Heat Therapy. Continue to monitor. 12/19/2021.. 9. Polyarthralgia:  Continue current medication regimen. Continue to monitor. 12/19/2021. 10. Constipation: No complaints today. Continue Senna- Ms. Ezzard Standing will F/U with her PCP. She will monitor her Dietary Habits. Continue to monitor. 12/19/2021     F/U in 2 months

## 2022-02-20 ENCOUNTER — Encounter: Payer: 59 | Admitting: Registered Nurse

## 2022-02-23 NOTE — Progress Notes (Signed)
Subjective:    Patient ID: Mariah Morris, female    DOB: 04-04-1970, 52 y.o.   MRN: [id number]  HPI: Mariah Morris is a 52 y.o. female who returns for follow up appointment for chronic pain and medication refill. She states her pain is located in  her lower back and bilateral lower extremities. She rates her pain 6. Her current exercise regime is walking and performing stretching exercises.  Ms. Purdie Morphine equivalent is 29.46 MME. She is also prescribed Alprazolam  by Darleene Cleaver  .We have discussed the black box warning of using opioids and benzodiazepines. I highlighted the dangers of using these drugs together and discussed the adverse events including respiratory suppression, overdose, cognitive impairment and importance of compliance with current regimen. We will continue to monitor and adjust as indicated.  she is being closely monitored and under the care of her psychiatrist.    UDS ordered today.    Pain Inventory Average Pain 6 Pain Right Now 6 My pain is constant and aching  In the last 24 hours, has pain interfered with the following? General activity 5 Relation with others 5 Enjoyment of life 5 What TIME of day is your pain at its worst? evening and night Sleep (in general) Good  Pain is worse with: walking, bending, and sitting Pain improves with: medication Relief from Meds: 7  Family History  Problem Relation Age of Onset   Hypertension Mother    Diabetes Father    Hypertension Father    Social History   Socioeconomic History   Marital status: Single    Spouse name: Not on file   Number of children: Not on file   Years of education: Not on file   Highest education level: Not on file  Occupational History   Not on file  Tobacco Use   Smoking status: Never   Smokeless tobacco: Never  Vaping Use   Vaping Use: Never used  Substance and Sexual Activity   Alcohol use: No   Drug use: Never   Sexual activity: Not on file  Other Topics  Concern   Not on file  Social History Narrative   Not on file   Social Determinants of Health   Financial Resource Strain: Not on file  Food Insecurity: Not on file  Transportation Needs: Not on file  Physical Activity: Not on file  Stress: Not on file  Social Connections: Not on file   Past Surgical History:  Procedure Laterality Date   CESAREAN SECTION     x2   OVARIAN CYST REMOVAL     Past Surgical History:  Procedure Laterality Date   CESAREAN SECTION     x2   OVARIAN CYST REMOVAL     Past Medical History:  Diagnosis Date   Chronic back pain    GAD (generalized anxiety disorder)    GERD (gastroesophageal reflux disease)    Gestational diabetes    Hypertension    Neuromuscular disorder ([place]) 2011   MS   Presence of permanent cardiac pacemaker    Seizures ([place])    There were no vitals taken for this visit.  Opioid Risk Score:   Fall Risk Score:  `1  Depression screen [facility] 2/9     10/18/2021   10:02 AM 06/16/2021   10:26 AM 04/14/2021    9:52 AM 12/09/2020    9:16 AM 10/28/2020    8:49 AM 06/29/2020   11:16 AM 05/21/2020   11:16 AM  Depression screen [facility] 2/9  Decreased Interest 0 1 0 0 1 1 0  Down, Depressed, Hopeless 1 3 0 0 1 1 0  PHQ - 2 Score 1 4 0 0 2 2 0  Altered sleeping       0  PHQ-9 Score       0    Review of Systems  Musculoskeletal:  Positive for back pain and gait problem.  All other systems reviewed and are negative.     Objective:   Physical Exam Vitals and nursing note reviewed.  Constitutional:      Appearance: Normal appearance. She is obese.  Cardiovascular:     Rate and Rhythm: Normal rate and regular rhythm.     Pulses: Normal pulses.     Heart sounds: Normal heart sounds.  Pulmonary:     Effort: Pulmonary effort is normal.     Breath sounds: Normal breath sounds.  Musculoskeletal:     Cervical back: Normal range of motion and neck supple.     Comments: Normal Muscle Bulk and Muscle Testing Reveals:  Upper Extremities:  Full ROM and Muscle Strength 5/5 Lumbar Paraspinal Tenderness: L-3-L-5 Lower Extremities: Full ROM and Muscle Strength 5/5 Bilateral Lower Extremities Flexion Produces Pain into her Bilateral Lower Extremities Arises from chair with ease Narrow Based Gait     Skin:    General: Skin is warm and dry.  Neurological:     Mental Status: She is alert and oriented to person, place, and time.  Psychiatric:        Mood and Affect: Mood normal.        Behavior: Behavior normal.         Assessment & Plan:  1.Multiple Sclerosis: Neurology Following: Dr. Manuella Ghazi. 02/24/2022 2. Lumbar Spondylolisthesis/Low back pain/ Lumbar Radiculitis/ Neuropathic Pain: Continue Current exercise Regime. Continue using heat therapy. Continue current medication regimen with Gabapentin. 02/24/2022  3.  Chronic Pain: Continue with the slow weaning. Tolerating slow weaning. RX:  OxyCODONE 7.5/325 mg one tablet 3 times daily as need for pain #75 We will continue to monitor. Second script sent for the following month. 02/24/2022.  We will continue the opioid monitoring program, this consists of regular clinic visits, examinations, urine drug screen, pill counts as well as use of [place] Controlled Substance Reporting system. A 12 month History has been reviewed on the [place] Controlled Substance Reporting System on 02/24/2022 4. Muscle Spasms: Continue current medication regimen with Tizanidine. 02/24/2022  5. Depression/anxiety:Psychiatry Following Dr. Vena Rua  Continue Monthly Counseling. Continue Current Medication Regime. Xanax . 02/24/2022 6. Morbid obesity:Encouraged to continue with weight Loss and Healthy Living Regimen. 02/24/2022 7. Insomnia: No complaints today. Continue to monitor. 02/24/2022 8. Right Greater trochanter Bursitis: No complaints today. Continue Alternating Ice and Heat Therapy. Continue to monitor. 02/24/2022.. 9. Polyarthralgia:  Continue current medication regimen. Continue to monitor.  02/24/2022. 10. Constipation: No complaints today. Continue Senna- Ms. Lucia Gaskins will F/U with her PCP. She will monitor her Dietary Habits. Continue to monitor. 02/24/2022     F/U in 2 months

## 2022-02-24 ENCOUNTER — Encounter: Payer: Self-pay | Admitting: Registered Nurse

## 2022-02-24 ENCOUNTER — Encounter: Payer: 59 | Attending: Physical Medicine & Rehabilitation | Admitting: Registered Nurse

## 2022-02-24 VITALS — BP 126/78 | HR 76 | Ht 62.0 in | Wt 289.0 lb

## 2022-02-24 DIAGNOSIS — G894 Chronic pain syndrome: Secondary | ICD-10-CM | POA: Diagnosis not present

## 2022-02-24 DIAGNOSIS — Z5181 Encounter for therapeutic drug level monitoring: Secondary | ICD-10-CM

## 2022-02-24 DIAGNOSIS — M4316 Spondylolisthesis, lumbar region: Secondary | ICD-10-CM

## 2022-02-24 DIAGNOSIS — Z79891 Long term (current) use of opiate analgesic: Secondary | ICD-10-CM | POA: Diagnosis not present

## 2022-02-24 MED ORDER — OXYCODONE-ACETAMINOPHEN 7.5-325 MG PO TABS: 1.0000 | ORAL_TABLET | Freq: Three times a day (TID) | ORAL | 0 refills | Status: DC | PRN

## 2022-03-01 LAB — TOXASSURE SELECT,+ANTIDEPR,UR

## 2022-03-17 ENCOUNTER — Ambulatory Visit: Payer: Medicare Other | Admitting: Dietician

## 2022-04-20 ENCOUNTER — Telehealth: Payer: Self-pay | Admitting: *Deleted

## 2022-04-20 NOTE — Telephone Encounter (Signed)
Urine drug screen for this encounter is consistent for prescribed medication 

## 2022-04-21 ENCOUNTER — Encounter: Payer: 59 | Admitting: Registered Nurse

## 2022-05-03 ENCOUNTER — Encounter: Payer: 59 | Attending: Physical Medicine & Rehabilitation | Admitting: Registered Nurse

## 2022-05-03 ENCOUNTER — Encounter: Payer: Self-pay | Admitting: Registered Nurse

## 2022-05-03 ENCOUNTER — Encounter: Payer: 59 | Admitting: Registered Nurse

## 2022-05-03 VITALS — BP 107/71 | HR 73 | Ht 62.0 in | Wt 295.0 lb

## 2022-05-03 DIAGNOSIS — M25512 Pain in left shoulder: Secondary | ICD-10-CM | POA: Diagnosis present

## 2022-05-03 DIAGNOSIS — M25511 Pain in right shoulder: Secondary | ICD-10-CM | POA: Insufficient documentation

## 2022-05-03 DIAGNOSIS — Z79891 Long term (current) use of opiate analgesic: Secondary | ICD-10-CM | POA: Insufficient documentation

## 2022-05-03 DIAGNOSIS — M7062 Trochanteric bursitis, left hip: Secondary | ICD-10-CM | POA: Insufficient documentation

## 2022-05-03 DIAGNOSIS — M7061 Trochanteric bursitis, right hip: Secondary | ICD-10-CM | POA: Insufficient documentation

## 2022-05-03 DIAGNOSIS — R202 Paresthesia of skin: Secondary | ICD-10-CM

## 2022-05-03 DIAGNOSIS — M25562 Pain in left knee: Secondary | ICD-10-CM | POA: Diagnosis present

## 2022-05-03 DIAGNOSIS — G8929 Other chronic pain: Secondary | ICD-10-CM | POA: Diagnosis present

## 2022-05-03 DIAGNOSIS — M25561 Pain in right knee: Secondary | ICD-10-CM | POA: Diagnosis present

## 2022-05-03 DIAGNOSIS — M2550 Pain in unspecified joint: Secondary | ICD-10-CM | POA: Diagnosis not present

## 2022-05-03 DIAGNOSIS — M4316 Spondylolisthesis, lumbar region: Secondary | ICD-10-CM | POA: Diagnosis not present

## 2022-05-03 DIAGNOSIS — G894 Chronic pain syndrome: Secondary | ICD-10-CM | POA: Insufficient documentation

## 2022-05-03 DIAGNOSIS — Z5181 Encounter for therapeutic drug level monitoring: Secondary | ICD-10-CM | POA: Diagnosis present

## 2022-05-03 MED ORDER — OXYCODONE-ACETAMINOPHEN 7.5-325 MG PO TABS: 1.0000 | ORAL_TABLET | Freq: Three times a day (TID) | ORAL | 0 refills | Status: DC | PRN

## 2022-05-03 NOTE — Progress Notes (Signed)
Subjective:    Patient ID: Mariah Morris, female    DOB: [birth year], 52 y.o.   MRN: [id number]  HPI: [name] is a 52 y.o. female who returns for follow up appointment for chronic pain and medication refill. She states her pain is located in her bilateral shoulders, lower back pain, bilateral lower extremities with numbness and tingling, bilateral hip and bilateral knee pain. She rates her pain 7. Her current exercise regime is walking and performing stretching exercises.  [name] Morphine equivalent is 33.75 MME.She is also prescribed Alprazolam by Darleene Cleaver .We have discussed the black box warning of using opioids and benzodiazepines. I highlighted the dangers of using these drugs together and discussed the adverse events including respiratory suppression, overdose, cognitive impairment and importance of compliance with current regimen. We will continue to monitor and adjust as indicated.  she is being closely monitored and under the care of her psychiatrist.  Last UDS was Performed on 02/24/2022, it was consistent.    Pain Inventory Average Pain 7 Pain Right Now 7 My pain is constant, sharp, burning, stabbing, and aching  In the last 24 hours, has pain interfered with the following? General activity 5 Relation with others 5 Enjoyment of life 5 What TIME of day is your pain at its worst? evening and night Sleep (in general) Fair  Pain is worse with: walking, bending, sitting, and standing Pain improves with: rest, heat/ice, therapy/exercise, and medication Relief from Meds: 8  Family History  Problem Relation Age of Onset   Hypertension Mother    Diabetes Father    Hypertension Father    Social History   Socioeconomic History   Marital status: Single    Spouse name: Not on file   Number of children: Not on file   Years of education: Not on file   Highest education level: Not on file  Occupational History   Not on file  Tobacco Use   Smoking  status: Never   Smokeless tobacco: Never  Vaping Use   Vaping Use: Never used  Substance and Sexual Activity   Alcohol use: No   Drug use: Never   Sexual activity: Not on file  Other Topics Concern   Not on file  Social History Narrative   Not on file   Social Determinants of Health   Financial Resource Strain: Not on file  Food Insecurity: Not on file  Transportation Needs: Not on file  Physical Activity: Not on file  Stress: Not on file  Social Connections: Not on file   Past Surgical History:  Procedure Laterality Date   CESAREAN SECTION     x2   OVARIAN CYST REMOVAL     Past Surgical History:  Procedure Laterality Date   CESAREAN SECTION     x2   OVARIAN CYST REMOVAL     Past Medical History:  Diagnosis Date   Chronic back pain    GAD (generalized anxiety disorder)    GERD (gastroesophageal reflux disease)    Gestational diabetes    Hypertension    Neuromuscular disorder 2011   MS   Presence of permanent cardiac pacemaker    Seizures    BP (!) 172/83   Pulse 73   Ht 5\' 2"  (1.575 m)   Wt 295 lb (133.8 kg)   SpO2 97%   BMI 53.96 kg/m   Opioid Risk Score:   Fall Risk Score:  `1  Depression screen [facility] 2/9     05/03/2022  10:21 AM 10/18/2021   10:02 AM 06/16/2021   10:26 AM 04/14/2021    9:52 AM 12/09/2020    9:16 AM 10/28/2020    8:49 AM 06/29/2020   11:16 AM  Depression screen PHQ 2/9  Decreased Interest 0 0 1 0 0 1 1  Down, Depressed, Hopeless 0 1 3 0 0 1 1  PHQ - 2 Score 0 1 4 0 0 2 2      Review of Systems  Musculoskeletal:  Positive for back pain and gait problem.  All other systems reviewed and are negative.     Objective:   Physical Exam Vitals and nursing note reviewed.  Constitutional:      Appearance: Normal appearance.  Cardiovascular:     Rate and Rhythm: Normal rate and regular rhythm.     Pulses: Normal pulses.     Heart sounds: Normal heart sounds.  Pulmonary:     Effort: Pulmonary effort is normal.     Breath sounds:  Normal breath sounds.  Musculoskeletal:     Cervical back: Normal range of motion and neck supple.     Comments: Normal Muscle Bulk and Muscle Testing Reveals:  Upper Extremities: Full ROM and Muscle Strength 5/5 Bilateral AC Joint Tenderness  Lumbar Paraspinal Tenderness: L-3-L-5 Lower Extremities: Full ROM and Muscle Strength 5/5 Arises from Table slowly Antalgic Gait     Skin:    General: Skin is warm and dry.  Neurological:     Mental Status: She is alert and oriented to person, place, and time.  Psychiatric:        Mood and Affect: Mood normal.        Behavior: Behavior normal.         Assessment & Plan:  1.Multiple Sclerosis: Neurology Following: Dr. Manuella Ghazi. 05/03/2022 2. Lumbar Spondylolisthesis/Low back pain/ Lumbar Radiculitis/ Neuropathic Pain: Continue Current exercise Regime. Continue using heat therapy. Continue current medication regimen with Gabapentin. 05/03/2022  3.  Chronic Pain: Continue with the slow weaning. Tolerating slow weaning. Refilled:  OxyCODONE 7.5/325 mg one tablet 3 times daily as need for pain #75 We will continue to monitor. Second script sent for the following month. 05/03/2022.  We will continue the opioid monitoring program, this consists of regular clinic visits, examinations, urine drug screen, pill counts as well as use of [place] Controlled Substance Reporting system. A 12 month History has been reviewed on the [place] Controlled Substance Reporting System on 05/03/2022 4. Muscle Spasms: Continue current medication regimen with Tizanidine. 05/03/2022  5. Depression/anxiety:Psychiatry Following Dr. Vena Rua  Continue Monthly Counseling. Continue Current Medication Regime. Xanax . 05/03/2022 6. Morbid obesity:Encouraged to continue with weight Loss and Healthy Living Regimen. 05/03/2022 7. Insomnia: No complaints today. Continue to monitor. 05/03/2022 8. Right Greater trochanter Bursitis: No complaints today. Continue Alternating Ice  and Heat Therapy. Continue to monitor. 05/03/2022.. 9. Polyarthralgia:  Continue current medication regimen. Continue to monitor. 05/03/2022. 10. Constipation: No complaints today. Continue Senna- Ms. Lucia Gaskins will F/U with her PCP. She will monitor her Dietary Habits. Continue to monitor. 05/03/2022     F/U in 2 months

## 2022-07-11 ENCOUNTER — Encounter: Payer: 59 | Admitting: Registered Nurse

## 2022-07-12 ENCOUNTER — Encounter: Payer: 59 | Attending: Physical Medicine & Rehabilitation | Admitting: Registered Nurse

## 2022-07-12 ENCOUNTER — Encounter: Payer: Self-pay | Admitting: Registered Nurse

## 2022-07-12 VITALS — BP 112/78 | HR 98 | Ht 62.0 in | Wt 291.6 lb

## 2022-07-12 DIAGNOSIS — Z79891 Long term (current) use of opiate analgesic: Secondary | ICD-10-CM | POA: Diagnosis present

## 2022-07-12 DIAGNOSIS — M2550 Pain in unspecified joint: Secondary | ICD-10-CM | POA: Insufficient documentation

## 2022-07-12 DIAGNOSIS — G8929 Other chronic pain: Secondary | ICD-10-CM | POA: Insufficient documentation

## 2022-07-12 DIAGNOSIS — M25561 Pain in right knee: Secondary | ICD-10-CM | POA: Insufficient documentation

## 2022-07-12 DIAGNOSIS — M4316 Spondylolisthesis, lumbar region: Secondary | ICD-10-CM | POA: Diagnosis present

## 2022-07-12 DIAGNOSIS — R202 Paresthesia of skin: Secondary | ICD-10-CM | POA: Diagnosis present

## 2022-07-12 DIAGNOSIS — Z5181 Encounter for therapeutic drug level monitoring: Secondary | ICD-10-CM | POA: Diagnosis present

## 2022-07-12 DIAGNOSIS — G894 Chronic pain syndrome: Secondary | ICD-10-CM | POA: Diagnosis present

## 2022-07-12 DIAGNOSIS — M25562 Pain in left knee: Secondary | ICD-10-CM | POA: Insufficient documentation

## 2022-07-12 MED ORDER — OXYCODONE-ACETAMINOPHEN 7.5-325 MG PO TABS: 1.0000 | ORAL_TABLET | Freq: Three times a day (TID) | ORAL | 0 refills | Status: DC | PRN

## 2022-07-12 NOTE — Progress Notes (Signed)
Subjective:    Patient ID: Mariah Morris, female    DOB: 1970/09/20, 52 y.o.   MRN: [id number]  HPI: [name] is a 52 y.o. female who returns for follow up appointment for chronic pain and medication refill. She states her pain is located in her lower back, bilateral knee pain and generalized joint pain. She  rates her pain 6. Her current exercise regime is walking , going to the [facility] weekly and performing stretching exercises.  [name] Morphine equivalent is 28.13 MME.   UDS ordered today.     Pain Inventory Average Pain 6 Pain Right Now 6 My pain is constant, sharp, burning, stabbing, and aching  In the last 24 hours, has pain interfered with the following? General activity 4 Relation with others 5 Enjoyment of life 5 What TIME of day is your pain at its worst? evening and night Sleep (in general) Poor  Pain is worse with: walking, bending, sitting, and standing Pain improves with: rest, heat/ice, therapy/exercise, and medication Relief from Meds: 8  Family History  Problem Relation Age of Onset   Hypertension Mother    Diabetes Father    Hypertension Father    Social History   Socioeconomic History   Marital status: Single    Spouse name: Not on file   Number of children: Not on file   Years of education: Not on file   Highest education level: Not on file  Occupational History   Not on file  Tobacco Use   Smoking status: Never   Smokeless tobacco: Never  Vaping Use   Vaping Use: Never used  Substance and Sexual Activity   Alcohol use: No   Drug use: Never   Sexual activity: Not on file  Other Topics Concern   Not on file  Social History Narrative   Not on file   Social Determinants of Health   Financial Resource Strain: Not on file  Food Insecurity: Not on file  Transportation Needs: Not on file  Physical Activity: Not on file  Stress: Not on file  Social Connections: Not on file   Past Surgical History:  Procedure Laterality  Date   CESAREAN SECTION     x2   OVARIAN CYST REMOVAL     Past Surgical History:  Procedure Laterality Date   CESAREAN SECTION     x2   OVARIAN CYST REMOVAL     Past Medical History:  Diagnosis Date   Chronic back pain    GAD (generalized anxiety disorder)    GERD (gastroesophageal reflux disease)    Gestational diabetes    Hypertension    Neuromuscular disorder (HCC) 2011   MS   Presence of permanent cardiac pacemaker    Seizures (HCC)    BP 112/78   Pulse 98   Ht 5\' 2"  (1.575 m)   Wt 291 lb 9.6 oz (132.3 kg)   SpO2 96%   BMI 53.33 kg/m   Opioid Risk Score:   Fall Risk Score:  `1  Depression screen [facility] 2/9     05/03/2022   10:21 AM 10/18/2021   10:02 AM 06/16/2021   10:26 AM 04/14/2021    9:52 AM 12/09/2020    9:16 AM 10/28/2020    8:49 AM 06/29/2020   11:16 AM  Depression screen PHQ 2/9  Decreased Interest 0 0 1 0 0 1 1  Down, Depressed, Hopeless 0 1 3 0 0 1 1  PHQ - 2 Score 0 1 4 0 0  2 2     Review of Systems  Musculoskeletal:  Positive for back pain.       Bilateral lower leg pain  All other systems reviewed and are negative.     Objective:   Physical Exam Vitals and nursing note reviewed.  Constitutional:      Appearance: Normal appearance. She is obese.  Cardiovascular:     Rate and Rhythm: Normal rate and regular rhythm.     Pulses: Normal pulses.     Heart sounds: Normal heart sounds.  Pulmonary:     Effort: Pulmonary effort is normal.     Breath sounds: Normal breath sounds.  Musculoskeletal:     Cervical back: Normal range of motion and neck supple.     Comments: Normal Muscle Bulk and Muscle Testing Reveals:  Upper Extremities: Full ROM and Muscle Strength 5/5 Lumbar Paraspinal Tenderness: L-4-L-5 Lower Extremities: Full ROM and Muscle Strength 5/5 Arises from Chair slowly Antalgic  Gait     Skin:    General: Skin is warm and dry.  Neurological:     Mental Status: She is alert and oriented to person, place, and time.  Psychiatric:         Mood and Affect: Mood normal.        Behavior: Behavior normal.         Assessment & Plan:  1.Multiple Sclerosis: Neurology Following: Dr. Sherryll Burger. 07/12/2022 2. Lumbar Spondylolisthesis/Low back pain/ Lumbar Radiculitis/ Neuropathic Pain: Continue Current exercise Regime. Continue using heat therapy. Continue current medication regimen with Gabapentin. 07/12/2022  3.  Chronic Pain: Continue with the slow weaning. Tolerating slow weaning. Refilled:  OxyCODONE 7.5/325 mg one tablet 3 times daily as need for pain #75 We will continue to monitor. Second script sent for the following month. 07/12/2022.  We will continue the opioid monitoring program, this consists of regular clinic visits, examinations, urine drug screen, pill counts as well as use of [place] Controlled Substance Reporting system. A 12 month History has been reviewed on the [place] Controlled Substance Reporting System on 07/12/2022 4. Muscle Spasms: Continue current medication regimen with Tizanidine. 07/12/2022  5. Depression/anxiety:Psychiatry Following Dr. Percell Belt  Continue Monthly Counseling. Continue Current Medication Regime. Xanax . 07/12/2022 6. Morbid obesity:Encouraged to continue with weight Loss and Healthy Living Regimen. 07/12/2022 7. Insomnia: No complaints today. Continue to monitor. 07/12/2022 8. Right Greater trochanter Bursitis: No complaints today. Continue Alternating Ice and Heat Therapy. Continue to monitor. 07/12/2022.. 9. Polyarthralgia:  Continue current medication regimen. Continue to monitor. 07/12/2022. 10. Constipation: No complaints today. Continue Senna- Ms. Ezzard Standing will F/U with her PCP. She will monitor her Dietary Habits. Continue to monitor. 07/12/2022     F/U in 2 months

## 2022-07-18 LAB — TOXASSURE SELECT,+ANTIDEPR,UR

## 2022-07-24 ENCOUNTER — Other Ambulatory Visit: Payer: Self-pay | Admitting: Student

## 2022-07-24 DIAGNOSIS — G35 Multiple sclerosis: Secondary | ICD-10-CM

## 2022-09-11 NOTE — Progress Notes (Deleted)
Subjective:    Patient ID: Mariah Morris, female    DOB: 05-26-1970, 52 y.o.   MRN: [id number]  HPI   Pain Inventory Average Pain {NUMBERS; 0-10:5044} Pain Right Now {NUMBERS; 0-10:5044} My pain is {PAIN DESCRIPTION:21022940}  In the last 24 hours, has pain interfered with the following? General activity {NUMBERS; 0-10:5044} Relation with others {NUMBERS; 0-10:5044} Enjoyment of life {NUMBERS; 0-10:5044} What TIME of day is your pain at its worst? {time of day:24191} Sleep (in general) {BHH GOOD/FAIR/POOR:22877}  Pain is worse with: {ACTIVITIES:21022942} Pain improves with: {PAIN IMPROVES KGUR:42706237} Relief from Meds: {NUMBERS; 0-10:5044}  Family History  Problem Relation Age of Onset   Hypertension Mother    Diabetes Father    Hypertension Father    Social History   Socioeconomic History   Marital status: Single    Spouse name: Not on file   Number of children: Not on file   Years of education: Not on file   Highest education level: Not on file  Occupational History   Not on file  Tobacco Use   Smoking status: Never   Smokeless tobacco: Never  Vaping Use   Vaping status: Never Used  Substance and Sexual Activity   Alcohol use: No   Drug use: Never   Sexual activity: Not on file  Other Topics Concern   Not on file  Social History Narrative   Not on file   Social Determinants of Health   Financial Resource Strain: Not on file  Food Insecurity: Not on file  Transportation Needs: Not on file  Physical Activity: Not on file  Stress: Not on file  Social Connections: Unknown (10/21/2018)   Received from [facility], [facility]   Social Connection and Isolation Panel [NHANES]    Frequency of Communication with Friends and Family: Not on file    Frequency of Social Gatherings with Friends and Family: Not on file    Attends Religious Services: Not on file    Active Member of Clubs or Organizations: Not on file     Attends Banker Meetings: Not on file    Marital Status: Not asked   Past Surgical History:  Procedure Laterality Date   CESAREAN SECTION     x2   OVARIAN CYST REMOVAL     Past Surgical History:  Procedure Laterality Date   CESAREAN SECTION     x2   OVARIAN CYST REMOVAL     Past Medical History:  Diagnosis Date   Chronic back pain    GAD (generalized anxiety disorder)    GERD (gastroesophageal reflux disease)    Gestational diabetes    Hypertension    Neuromuscular disorder (HCC) 2011   MS   Presence of permanent cardiac pacemaker    Seizures (HCC)    There were no vitals taken for this visit.  Opioid Risk Score:   Fall Risk Score:  `1  Depression screen [facility] 2/9     05/03/2022   10:21 AM 10/18/2021   10:02 AM 06/16/2021   10:26 AM 04/14/2021    9:52 AM 12/09/2020    9:16 AM 10/28/2020    8:49 AM 06/29/2020   11:16 AM  Depression screen PHQ 2/9  Decreased Interest 0 0 1 0 0 1 1  Down, Depressed, Hopeless 0 1 3 0 0 1 1  PHQ - 2 Score 0 1 4 0 0 2 2    Review of Systems     Objective:   Physical  Exam        Assessment & Plan:

## 2022-09-12 ENCOUNTER — Encounter: Payer: 59 | Attending: Physical Medicine & Rehabilitation | Admitting: Registered Nurse

## 2022-09-12 DIAGNOSIS — M2550 Pain in unspecified joint: Secondary | ICD-10-CM | POA: Insufficient documentation

## 2022-09-12 DIAGNOSIS — G8929 Other chronic pain: Secondary | ICD-10-CM | POA: Insufficient documentation

## 2022-09-12 DIAGNOSIS — Z5181 Encounter for therapeutic drug level monitoring: Secondary | ICD-10-CM | POA: Insufficient documentation

## 2022-09-12 DIAGNOSIS — M4316 Spondylolisthesis, lumbar region: Secondary | ICD-10-CM | POA: Insufficient documentation

## 2022-09-12 DIAGNOSIS — G894 Chronic pain syndrome: Secondary | ICD-10-CM | POA: Insufficient documentation

## 2022-09-12 DIAGNOSIS — Z79891 Long term (current) use of opiate analgesic: Secondary | ICD-10-CM | POA: Insufficient documentation

## 2022-09-12 DIAGNOSIS — R202 Paresthesia of skin: Secondary | ICD-10-CM | POA: Insufficient documentation

## 2022-09-12 DIAGNOSIS — M25562 Pain in left knee: Secondary | ICD-10-CM | POA: Insufficient documentation

## 2022-09-12 DIAGNOSIS — M25561 Pain in right knee: Secondary | ICD-10-CM | POA: Insufficient documentation
